# Patient Record
Sex: Male | Born: 1950 | Race: Black or African American | Hispanic: No | Smoking: Current every day smoker
Health system: Southern US, Community
[De-identification: ages and names within clinical notes are randomized; demographics above are authoritative.]

## PROBLEM LIST (undated history)

## (undated) DIAGNOSIS — K859 Acute pancreatitis without necrosis or infection, unspecified: Secondary | ICD-10-CM

## (undated) DIAGNOSIS — D751 Secondary polycythemia: Secondary | ICD-10-CM

## (undated) DIAGNOSIS — D649 Anemia, unspecified: Secondary | ICD-10-CM

## (undated) DIAGNOSIS — I10 Essential (primary) hypertension: Secondary | ICD-10-CM

## (undated) DIAGNOSIS — I639 Cerebral infarction, unspecified: Secondary | ICD-10-CM

## (undated) DIAGNOSIS — T7840XA Allergy, unspecified, initial encounter: Secondary | ICD-10-CM

## (undated) DIAGNOSIS — Z87891 Personal history of nicotine dependence: Secondary | ICD-10-CM

## (undated) HISTORY — DX: Cerebral infarction, unspecified: I63.9

## (undated) HISTORY — DX: Allergy, unspecified, initial encounter: T78.40XA

## (undated) HISTORY — DX: Personal history of nicotine dependence: Z87.891

## (undated) HISTORY — DX: Secondary polycythemia: D75.1

## (undated) HISTORY — DX: Essential (primary) hypertension: I10

---

## 2005-12-04 ENCOUNTER — Emergency Department: Payer: Self-pay | Admitting: Internal Medicine

## 2007-09-02 ENCOUNTER — Inpatient Hospital Stay: Payer: Self-pay | Admitting: Internal Medicine

## 2007-09-02 ENCOUNTER — Other Ambulatory Visit: Payer: Self-pay

## 2007-09-26 ENCOUNTER — Encounter: Payer: Self-pay | Admitting: Internal Medicine

## 2007-10-22 ENCOUNTER — Encounter: Payer: Self-pay | Admitting: Internal Medicine

## 2007-11-21 ENCOUNTER — Encounter: Payer: Self-pay | Admitting: Internal Medicine

## 2007-12-22 ENCOUNTER — Encounter: Payer: Self-pay | Admitting: Internal Medicine

## 2008-01-22 ENCOUNTER — Encounter: Payer: Self-pay | Admitting: Internal Medicine

## 2008-02-19 ENCOUNTER — Encounter: Payer: Self-pay | Admitting: Internal Medicine

## 2015-02-06 DIAGNOSIS — R300 Dysuria: Secondary | ICD-10-CM | POA: Diagnosis not present

## 2015-02-06 DIAGNOSIS — E782 Mixed hyperlipidemia: Secondary | ICD-10-CM | POA: Diagnosis not present

## 2015-02-06 DIAGNOSIS — I10 Essential (primary) hypertension: Secondary | ICD-10-CM | POA: Diagnosis not present

## 2015-02-06 DIAGNOSIS — F17210 Nicotine dependence, cigarettes, uncomplicated: Secondary | ICD-10-CM | POA: Diagnosis not present

## 2015-02-06 DIAGNOSIS — I679 Cerebrovascular disease, unspecified: Secondary | ICD-10-CM | POA: Diagnosis not present

## 2015-02-06 DIAGNOSIS — Z0001 Encounter for general adult medical examination with abnormal findings: Secondary | ICD-10-CM | POA: Diagnosis not present

## 2015-02-28 DIAGNOSIS — E782 Mixed hyperlipidemia: Secondary | ICD-10-CM | POA: Diagnosis not present

## 2015-02-28 DIAGNOSIS — Z0001 Encounter for general adult medical examination with abnormal findings: Secondary | ICD-10-CM | POA: Diagnosis not present

## 2015-02-28 DIAGNOSIS — I10 Essential (primary) hypertension: Secondary | ICD-10-CM | POA: Diagnosis not present

## 2015-02-28 DIAGNOSIS — Z125 Encounter for screening for malignant neoplasm of prostate: Secondary | ICD-10-CM | POA: Diagnosis not present

## 2015-08-05 DIAGNOSIS — I10 Essential (primary) hypertension: Secondary | ICD-10-CM | POA: Diagnosis not present

## 2015-08-05 DIAGNOSIS — E782 Mixed hyperlipidemia: Secondary | ICD-10-CM | POA: Diagnosis not present

## 2015-08-05 DIAGNOSIS — I679 Cerebrovascular disease, unspecified: Secondary | ICD-10-CM | POA: Diagnosis not present

## 2015-08-05 DIAGNOSIS — F17210 Nicotine dependence, cigarettes, uncomplicated: Secondary | ICD-10-CM | POA: Diagnosis not present

## 2016-02-11 DIAGNOSIS — I679 Cerebrovascular disease, unspecified: Secondary | ICD-10-CM | POA: Diagnosis not present

## 2016-02-11 DIAGNOSIS — I10 Essential (primary) hypertension: Secondary | ICD-10-CM | POA: Diagnosis not present

## 2016-02-11 DIAGNOSIS — B359 Dermatophytosis, unspecified: Secondary | ICD-10-CM | POA: Diagnosis not present

## 2016-02-11 DIAGNOSIS — Z0001 Encounter for general adult medical examination with abnormal findings: Secondary | ICD-10-CM | POA: Diagnosis not present

## 2016-02-11 DIAGNOSIS — M25775 Osteophyte, left foot: Secondary | ICD-10-CM | POA: Diagnosis not present

## 2016-03-10 DIAGNOSIS — Z0001 Encounter for general adult medical examination with abnormal findings: Secondary | ICD-10-CM | POA: Diagnosis not present

## 2016-03-10 DIAGNOSIS — E782 Mixed hyperlipidemia: Secondary | ICD-10-CM | POA: Diagnosis not present

## 2016-03-10 DIAGNOSIS — I10 Essential (primary) hypertension: Secondary | ICD-10-CM | POA: Diagnosis not present

## 2016-03-10 DIAGNOSIS — M064 Inflammatory polyarthropathy: Secondary | ICD-10-CM | POA: Diagnosis not present

## 2016-03-10 DIAGNOSIS — Z1211 Encounter for screening for malignant neoplasm of colon: Secondary | ICD-10-CM | POA: Diagnosis not present

## 2016-03-10 DIAGNOSIS — Z125 Encounter for screening for malignant neoplasm of prostate: Secondary | ICD-10-CM | POA: Diagnosis not present

## 2016-03-10 DIAGNOSIS — R748 Abnormal levels of other serum enzymes: Secondary | ICD-10-CM | POA: Insufficient documentation

## 2016-03-17 DIAGNOSIS — E875 Hyperkalemia: Secondary | ICD-10-CM | POA: Diagnosis not present

## 2016-03-17 DIAGNOSIS — F17210 Nicotine dependence, cigarettes, uncomplicated: Secondary | ICD-10-CM | POA: Diagnosis not present

## 2016-03-17 DIAGNOSIS — I69354 Hemiplegia and hemiparesis following cerebral infarction affecting left non-dominant side: Secondary | ICD-10-CM | POA: Diagnosis not present

## 2016-03-17 DIAGNOSIS — I10 Essential (primary) hypertension: Secondary | ICD-10-CM | POA: Diagnosis not present

## 2016-03-17 DIAGNOSIS — R0602 Shortness of breath: Secondary | ICD-10-CM | POA: Diagnosis not present

## 2016-03-17 DIAGNOSIS — E782 Mixed hyperlipidemia: Secondary | ICD-10-CM | POA: Diagnosis not present

## 2016-03-19 ENCOUNTER — Inpatient Hospital Stay: Payer: Medicare Other

## 2016-03-19 ENCOUNTER — Encounter: Payer: Self-pay | Admitting: Hematology and Oncology

## 2016-03-19 ENCOUNTER — Inpatient Hospital Stay: Payer: Medicare Other | Attending: Hematology and Oncology | Admitting: Hematology and Oncology

## 2016-03-19 VITALS — BP 147/73 | HR 81 | Temp 97.6°F | Resp 18 | Ht 70.0 in | Wt 149.7 lb

## 2016-03-19 DIAGNOSIS — Z87891 Personal history of nicotine dependence: Secondary | ICD-10-CM

## 2016-03-19 DIAGNOSIS — I10 Essential (primary) hypertension: Secondary | ICD-10-CM | POA: Diagnosis not present

## 2016-03-19 DIAGNOSIS — Z8673 Personal history of transient ischemic attack (TIA), and cerebral infarction without residual deficits: Secondary | ICD-10-CM

## 2016-03-19 DIAGNOSIS — D751 Secondary polycythemia: Secondary | ICD-10-CM | POA: Diagnosis not present

## 2016-03-19 DIAGNOSIS — F17210 Nicotine dependence, cigarettes, uncomplicated: Secondary | ICD-10-CM | POA: Insufficient documentation

## 2016-03-19 DIAGNOSIS — Z7982 Long term (current) use of aspirin: Secondary | ICD-10-CM | POA: Insufficient documentation

## 2016-03-19 DIAGNOSIS — Z79899 Other long term (current) drug therapy: Secondary | ICD-10-CM | POA: Insufficient documentation

## 2016-03-19 DIAGNOSIS — Z808 Family history of malignant neoplasm of other organs or systems: Secondary | ICD-10-CM | POA: Diagnosis not present

## 2016-03-19 DIAGNOSIS — R748 Abnormal levels of other serum enzymes: Secondary | ICD-10-CM

## 2016-03-19 LAB — CBC WITH DIFFERENTIAL/PLATELET
Basophils Absolute: 0 10*3/uL (ref 0–0.1)
Basophils Relative: 0 %
Eosinophils Absolute: 0 10*3/uL (ref 0–0.7)
Eosinophils Relative: 0 %
HCT: 57.3 % — ABNORMAL HIGH (ref 40.0–52.0)
Hemoglobin: 19.5 g/dL — ABNORMAL HIGH (ref 13.0–18.0)
Lymphocytes Relative: 14 %
Lymphs Abs: 0.7 10*3/uL — ABNORMAL LOW (ref 1.0–3.6)
MCH: 31.2 pg (ref 26.0–34.0)
MCHC: 34.1 g/dL (ref 32.0–36.0)
MCV: 91.5 fL (ref 80.0–100.0)
Monocytes Absolute: 0.5 10*3/uL (ref 0.2–1.0)
Monocytes Relative: 9 %
Neutro Abs: 3.8 10*3/uL (ref 1.4–6.5)
Neutrophils Relative %: 77 %
Platelets: 144 10*3/uL — ABNORMAL LOW (ref 150–440)
RBC: 6.26 MIL/uL — ABNORMAL HIGH (ref 4.40–5.90)
RDW: 13.5 % (ref 11.5–14.5)
WBC: 5 10*3/uL (ref 3.8–10.6)

## 2016-03-19 LAB — IRON AND TIBC
Iron: 136 ug/dL (ref 45–182)
Saturation Ratios: 41 % — ABNORMAL HIGH (ref 17.9–39.5)
TIBC: 333 ug/dL (ref 250–450)
UIBC: 198 ug/dL

## 2016-03-19 LAB — FERRITIN: Ferritin: 181 ng/mL (ref 24–336)

## 2016-03-19 NOTE — Progress Notes (Signed)
Pt reports:  Mild fatigue, had blood in stool beginning of March no colonoscopy.  48oz beer a day

## 2016-03-19 NOTE — Progress Notes (Signed)
[facility] day:  03/19/2016  Chief Complaint: Ian Conley is a 65 y.o. male with an elevated hemoglobin who is referred in consultation by Dr. Clayborn Bigness.  HPI:   The patient denies any prior history of a blood disorder.  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  He was seen on 02/11/2016 for health maintenance exam.  Because of swelling in his joints, his Aggrenox was changed to Plavix.  Labs were drawn.  CBC on 03/10/2016 revealed a hematocrit of 56.1, hemoglobin 19.8, MCV 88, platelets 154,000, and WBC 4200.  Creatinine was 1.02.  Alkaline phosphatase was 139 (39-117).  PSA was 0.4.  Urinalysis on 02/11/2016 revealed no blood.  Symptomatically, he feels pretty good.  He denies any headache or visual changes.  He denies any shortness of breath.  He denies any erythromelalgia.  He notes a 48 pack year smoking history.  He denies sleep apnea.  He does not take testosterone.  He denies any cardiac disease.  Past Medical History  Diagnosis Date  . Hypertension   . Stroke [facility])     2008 9/11  . Allergy   . Personal history of tobacco use, presenting hazards to health 03/25/2016    No past surgical history on file.  Family History  Problem Relation Age of Onset  . Diabetes Mother   . Cancer Father     Throat   . HIV Brother     Social History:  reports that he has been smoking Cigarettes.  He has a 45 pack-year smoking history. He does not have any smokeless tobacco history on file. He reports that he drinks about 12.6 oz of alcohol per week. He reports that he does not use illicit drugs.  He started smoking between the age of 3-18.  He smoked 1 pack a day for 48 years.  He drinks 2- 24 ounce beers/day.  He was in [employer] for 3 years.  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  He lives in [place].  The patient is alone today.  Allergies:  Allergies  Allergen Reactions  . Penicillins Rash    Current  Medications: Current Outpatient Prescriptions  Medication Sig Dispense Refill  . amLODipine (NORVASC) 10 MG tablet take 1 tablet by mouth once daily for blood pressure  0  . aspirin 81 MG tablet Take 81 mg by mouth daily.    . Chlorpheniramine-Phenylephrine 4-10 MG tablet Take 1 tablet by mouth every 4 (four) hours as needed for congestion.    . clopidogrel (PLAVIX) 75 MG tablet Take 75 mg by mouth daily.  0  . metoprolol succinate (TOPROL-XL) 25 MG 24 hr tablet take 1 tablet by mouth once daily for blood pressure  0  . traMADol (ULTRAM) 50 MG tablet Take 50 mg by mouth every 12 (twelve) hours as needed.     No current facility-administered medications for this visit.    Review of Systems:  GENERAL:  Feels pretty good.  Active.  No fevers or sweats.  He has lost 20 pounds in 8 years. PERFORMANCE STATUS (ECOG):  2 HEENT:  Gum disease (losing teeth).  Runny nose.  No visual changes, sore throat, mouth sores or tenderness. Lungs: No shortness of breath.  Am cough.  No hemoptysis.  No sleep apnea. Cardiac:  No chest pain, palpitations, orthopnea, or PND. GI:  Good appetite.  No nausea, vomiting, diarrhea, constipation or melena.  Blood in  stool.  No prior colonoscopy. GU:  No urgency, frequency, dysuria, or hematuria. Musculoskeletal:  No back pain.  Joint pain (knees and fingers).  No muscle tenderness. Extremities:  No pain or swelling. Skin:  No rashes or skin changes. Neuro:  h/o CVA.  No headache, numbness or weakness, balance or coordination issues. Endocrine:  No diabetes, thyroid issues, hot flashes or night sweats. Psych:  No mood changes, depression or anxiety. Pain:  No focal pain. Review of systems:  All other systems reviewed and found to be negative.  Physical Exam: Blood pressure 147/73, pulse 81, temperature 97.6 F (36.4 C), temperature source Tympanic, resp. rate 18, height 5\' 10"  (1.778 m), weight 149 lb 11.1 oz (67.9 kg). GENERAL:  Well developed, well nourished,  sitting comfortably in the exam room in no acute distress. MENTAL STATUS:  Alert and oriented to person, place and time. HEAD:  Wearing a black cap.  Gray hair with beard.  Normocephalic, atraumatic, face symmetric, no Cushingoid features. EYES:  Glasses.  Brown eyes.  Bilateral arcus senilis.  Pupils equal round and reactive to light and accomodation.  No conjunctivitis or scleral icterus. ENT:  Oropharynx clear without lesion.  Poor dentition.  Tongue normal. Mucous membranes moist.  RESPIRATORY:  Clear to auscultation without rales, wheezes or rhonchi. CARDIOVASCULAR:  Regular rate and rhythm without murmur, rub or gallop. ABDOMEN:  Soft, non-tender, with active bowel sounds, and no hepatosplenomegaly.  No masses. SKIN:  No rashes, ulcers or lesions. EXTREMITIES: No edema, no skin discoloration or tenderness.  No palpable cords. LYMPH NODES: No palpable cervical, supraclavicular, axillary or inguinal adenopathy  NEUROLOGICAL: Decreased left nasolabial fold. PSYCH:  Appropriate.  Clinical Support on 03/19/2016  Component Date Value Ref Range Status  . WBC 03/19/2016 5.0  3.8 - 10.6 K/uL Final  . RBC 03/19/2016 6.26* 4.40 - 5.90 MIL/uL Final  . Hemoglobin 03/19/2016 19.5* 13.0 - 18.0 g/dL Final  . HCT 03/19/2016 57.3* 40.0 - 52.0 % Final  . MCV 03/19/2016 91.5  80.0 - 100.0 fL Final  . MCH 03/19/2016 31.2  26.0 - 34.0 pg Final  . MCHC 03/19/2016 34.1  32.0 - 36.0 g/dL Final  . RDW 03/19/2016 13.5  11.5 - 14.5 % Final  . Platelets 03/19/2016 144* 150 - 440 K/uL Final  . Neutrophils Relative % 03/19/2016 77   Final  . Neutro Abs 03/19/2016 3.8  1.4 - 6.5 K/uL Final  . Lymphocytes Relative 03/19/2016 14   Final  . Lymphs Abs 03/19/2016 0.7* 1.0 - 3.6 K/uL Final  . Monocytes Relative 03/19/2016 9   Final  . Monocytes Absolute 03/19/2016 0.5  0.2 - 1.0 K/uL Final  . Eosinophils Relative 03/19/2016 0   Final  . Eosinophils Absolute 03/19/2016 0.0  0 - 0.7 K/uL Final  . Basophils Relative  03/19/2016 0   Final  . Basophils Absolute 03/19/2016 0.0  0 - 0.1 K/uL Final  . Erythropoietin 03/19/2016 3.0  2.6 - 18.5 mIU/mL Final   Comment: (NOTE) Performed At: [facility] [place], [place] [id number] Lindon Romp MD [id number]   . JAK2 GenotypR 03/19/2016 Comment   Final   Comment: (NOTE) Result: NEGATIVE for the JAK2 V617F mutation. Interpretation:  The G to T nucleotide change encoding the V617F mutation was not detected.  This result does not rule out the presence of the JAK2 mutation at a level below the sensitivity of detection of this assay, or the presence of other mutations within JAK2 not detected by  this assay.  This result does not rule out a diagnosis of polycythemia vera, essential thrombocythemia or idiopathic myelofibrosis as the V617F mutation is not detected in all patients with these disorders.   Marland Kitchen BACKGROUND: 03/19/2016 Comment   Final   Comment: (NOTE) JAK2 is a cytoplasmic tyrosine kinase with a key role in signal transduction from multiple hematopoietic growth factor receptors. A point mutation within exon 14 of the JAK2 gene [id number]) encoding a valine to phenylalanine substitution at position 617 of the JAK2 protein (V617F) has been identified in most patients with polycythemia vera, and in about half of those with either essential thrombocythemia or idiopathic myelofibrosis.  The V617F has also been detected, although infrequently, in other myeloid disorders such as chronic myelomonocytic leukemia and chronic neutrophilic leukemia. V617F is an acquired mutation that alters a highly conserved valine present in the negative regulatory JH2 domain of the JAK2 protein and is predicted to dysregulate kinase activity. Methodology: Genomic DNA was purified from the provided specimen.  Allele- specific PCR using fluorescent primers was used to simultaneously amplify both the wild type and mutant alleles. Amplificati                           on products were analyzed by capillary electrophoresis.  This assay has a sensitivity to detect approximately a 5% population of cells containing the V617F mutation in a background of non-mutant cells. Reference: Tefferi A and Gilliland DG.  The JAK2 V617F Tyrosine Kinase Mutation in Myeloproliferative  Disorders:  Status Report and Immediate Implications for Disease Classification and Diagnosis. Mayo Clin Proc 2005;80(7):947-958.   . Director Review, JAK2 03/19/2016 Comment   Final   Comment: (NOTE) Jacklynn Bue MS, PhD, [facility] [place], [place]   . Reflex: 03/19/2016 Comment   Corrected   Comment: (NOTE) Reflex to JAK2 Exon 12 Mutation Analysis is indicated. Performed At: [facility] [address] [place], [place] [id number] Nechama Guard MD [id number] Performed At: [facility] [address] [place], [place] [id number] Nechama Guard MD [id number]   . Leukocyte Alkaline  Phos Stain 03/19/2016 30  25 - 130 Final   Comment: (NOTE) Performed At: [facility] [place], [place] [id number] Lindon Romp MD [id number]   . Ferritin 03/19/2016 181  24 - 336 ng/mL Final  . Iron 03/19/2016 136  45 - 182 ug/dL Final  . TIBC 03/19/2016 333  250 - 450 ug/dL Final  . Saturation Ratios 03/19/2016 41* 17.9 - 39.5 % Final  . UIBC 03/19/2016 198   Final  . JAK2 EXON 12 ANALYSIS RESULT: 03/19/2016 Comment   Final   Comment: (NOTE) NEGATIVE for the JAK2 exon 12 mutations. Interpretation: JAK2 exon 12 mutations were not detected. This result does not rule out the presence of the JAK2 mutation at a level below the sensitivity of detection of this assay, or the presence of other mutations within JAK2 not detected by this assay. This result does not rule out a diagnosis of polycythemia vera, essential thrombocythemia  or idiopathic myelofibrosis as JAK2 exon 12 mutations are not detected in all patients with these disorders.   Marland Kitchen BACKGROUND: 03/19/2016 Comment   Final   Comment: (NOTE) JAK2 is a cytoplasmic tyrosine kinase with a key role in signal transduction in conjunction with multiple hematopoietic growth factor receptors. JAK2 mutation analysis can be used in conjunction with bone  marrow histology and cytogenetic analysis to assist in the diagnosis of myeloproliferative disorders (MPDs) such as polycythemia vera, essential thrombocythemia, and idiopathic myelofibrosis. The JAK2 V617F (exon 14) mutation analysis can provide information helpful in distinguishing MPDs from from reactive conditions, particularly secondary thrombocytosis and erythrocytosis. Because JAK2 exon 12 mutation status is associated with erythrocytosis and atypical bone marrow morphology, mutational analysis may be used to differentiate the reactive conditions from the malignant erythrocytosis. Peripheral blood mutation screening cannot substitute for bone marrow histology as JAK2 mutations are not always detected in patients with polycythemia vera and is absent in a                          lmost half of patients with essential thrombocythemia and idiopathic myelofibrosis. JAK2 Exon 12 mutations detected by this assay: Brule, [id number], [id number], [id number]. Methodology: Genomic DNA was purified from blood or bone marrow specimen. Allele-specific PCR using fluorescent primers was used to amplify both the wild type and mutant alleles. Amplification products were analyzed by capillary electrophoresis. Reference: 1. Tefferi A. JAK2 Mutations in Polycythemia Vera-Molecular   Mechanisms and Clinical Applications. N Engl J Med. 2007;   356(5):443-444. 2. Scott LM. et al. JAK2 Exon 12 Mutations in Polycythemia   Vera and Idiopathic Erythrocytosis. N Engl J Med. 2007;   356(5):459-468.   Marland Kitchen DIRECTOR REVIEW:  03/19/2016 Comment   Final   Comment: (NOTE) Jacklynn Bue MS, PhD, [facility] [place], [place] [phone] Performed At: [facility] [address] [place], [place] [id number] Nechama Guard MD [id number] Performed At: [facility] [address] [place], [place] [id number] Nechama Guard MD [id number]     Assessment:  ANTHONY WAXLER is a 65 y.o. 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  He has a 48 pack year smoking history.  He denies any cardiac disease, sleep apnea, or testosterone use.  He had a CVA in 09/01/2007.  He has an elevated alkaline phosphatase (139).  PSA was 0.4.  He has never had a colonoscopy.  Symptomatically, he feels good.  He notes blood in his stool since 02/2016.  Exam is unremarkable.  Plan: 1.  Discuss diagnosis of polycythemia.  Discuss differential diagnosis including primary polycythemia (PV) and secondary polycythemia.  Discuss likely etiology is smoking.  Discuss smoking cessation.  Discuss baseline labs.  Discuss possible phlebotomy treatment.  Discuss low dose spiral chest CT as screening for lung cancer given 48 pack year smoking history. 2.  Discuss elevated alkaline phosphatase and fractionation to determine source (bone versus liver). 3:  Labs today:  CBC with diff, erythropoietin level, JAK2 with reflex, ferritin, iron studies, fractionated alkaline phosphatase. 4.  Low dose spiral chest CT:  screening lung cancer. 5.  Encourage smoking cessation. 6.  Encourage colonoscopy re: health maintenance and blood in stool. 7.  RTC in 2 weeks for MD assessment and review of work-up.   Lequita Asal, MD  03/19/2016, 8:34 AM

## 2016-03-20 LAB — LEUKOCYTE ALKALINE PHOSPHATASE: Leukocyte Alkaline  Phos Stain: 30 (ref 25–130)

## 2016-03-24 ENCOUNTER — Ambulatory Visit: Admission: RE | Admit: 2016-03-24 | Payer: Medicare Other | Source: Ambulatory Visit

## 2016-03-25 ENCOUNTER — Encounter: Payer: Self-pay | Admitting: Family Medicine

## 2016-03-25 ENCOUNTER — Ambulatory Visit
Admission: RE | Admit: 2016-03-25 | Discharge: 2016-03-25 | Disposition: A | Discharge status: Home / Self Care | Payer: Medicare Other | Source: Ambulatory Visit | Attending: Family Medicine | Admitting: Family Medicine

## 2016-03-25 ENCOUNTER — Inpatient Hospital Stay: Payer: Medicare Other | Attending: Family Medicine | Admitting: Family Medicine

## 2016-03-25 ENCOUNTER — Other Ambulatory Visit: Payer: Self-pay | Admitting: Family Medicine

## 2016-03-25 DIAGNOSIS — Z122 Encounter for screening for malignant neoplasm of respiratory organs: Secondary | ICD-10-CM

## 2016-03-25 DIAGNOSIS — Z7982 Long term (current) use of aspirin: Secondary | ICD-10-CM | POA: Diagnosis not present

## 2016-03-25 DIAGNOSIS — Z8673 Personal history of transient ischemic attack (TIA), and cerebral infarction without residual deficits: Secondary | ICD-10-CM | POA: Diagnosis not present

## 2016-03-25 DIAGNOSIS — F17210 Nicotine dependence, cigarettes, uncomplicated: Secondary | ICD-10-CM | POA: Insufficient documentation

## 2016-03-25 DIAGNOSIS — D751 Secondary polycythemia: Secondary | ICD-10-CM | POA: Insufficient documentation

## 2016-03-25 DIAGNOSIS — R748 Abnormal levels of other serum enzymes: Secondary | ICD-10-CM | POA: Insufficient documentation

## 2016-03-25 DIAGNOSIS — K921 Melena: Secondary | ICD-10-CM | POA: Insufficient documentation

## 2016-03-25 DIAGNOSIS — Z79899 Other long term (current) drug therapy: Secondary | ICD-10-CM | POA: Diagnosis not present

## 2016-03-25 DIAGNOSIS — K3189 Other diseases of stomach and duodenum: Secondary | ICD-10-CM | POA: Insufficient documentation

## 2016-03-25 DIAGNOSIS — Z87891 Personal history of nicotine dependence: Secondary | ICD-10-CM | POA: Diagnosis not present

## 2016-03-25 DIAGNOSIS — I10 Essential (primary) hypertension: Secondary | ICD-10-CM | POA: Insufficient documentation

## 2016-03-25 HISTORY — DX: Personal history of nicotine dependence: Z87.891

## 2016-03-25 NOTE — Progress Notes (Signed)
In accordance with CMS guidelines, patient has meet eligibility criteria including age, absence of signs or symptoms of lung cancer, the specific calculation of cigarette smoking pack-years was 45 years and is a current smoker.   A shared decision-making session was conducted prior to the performance of CT scan. This includes one or more decision aids, includes benefits and harms of screening, follow-up diagnostic testing, over-diagnosis, false positive rate, and total radiation exposure.  Counseling on the importance of adherence to annual lung cancer LDCT screening, impact of co-morbidities, and ability or willingness to undergo diagnosis and treatment is imperative for compliance of the program.  Counseling on the importance of continued smoking cessation for former smokers; the importance of smoking cessation for current smokers and information about tobacco cessation interventions have been given to patient including the [facility] at [facility], 1800 quit [place], as well as Cancer Center specific smoking cessation programs.  Written order for lung cancer screening with LDCT has been given to the patient and any and all questions have been answered to the best of my abilities.   Yearly follow up will be scheduled by Shawn Perkins, Thoracic Navigator.   

## 2016-03-27 ENCOUNTER — Telehealth: Payer: Self-pay | Admitting: *Deleted

## 2016-03-27 NOTE — Telephone Encounter (Signed)
Notified patient of LDCT lung cancer screening results of Lung Rads 1 finding with recommendation for 12 month follow up imaging. Also notified of incidental finding noted below. Patient verbalizes understanding.   IMPRESSION: 1. Lung-RADS Category 1, negative. Continue annual screening with low-dose chest CT without contrast in 12 months. 2. Thickened area in the distal stomach or pyloric region, incompletely imaged. Please correlate clinically.

## 2016-04-01 LAB — JAK2 EXON 12 MUTATION ANALYSIS

## 2016-04-01 LAB — ERYTHROPOIETIN: Erythropoietin: 3 m[IU]/mL (ref 2.6–18.5)

## 2016-04-01 LAB — JAK2  V617F QUAL. WITH REFLEX TO EXON 12

## 2016-04-02 ENCOUNTER — Inpatient Hospital Stay: Payer: Medicare Other | Admitting: Internal Medicine

## 2016-04-03 ENCOUNTER — Inpatient Hospital Stay (HOSPITAL_BASED_OUTPATIENT_CLINIC_OR_DEPARTMENT_OTHER): Payer: Medicare Other | Admitting: Hematology and Oncology

## 2016-04-03 ENCOUNTER — Encounter: Payer: Self-pay | Admitting: Hematology and Oncology

## 2016-04-03 VITALS — BP 151/77 | HR 76 | Temp 98.2°F | Resp 18 | Wt 149.9 lb

## 2016-04-03 DIAGNOSIS — R748 Abnormal levels of other serum enzymes: Secondary | ICD-10-CM

## 2016-04-03 DIAGNOSIS — Z8673 Personal history of transient ischemic attack (TIA), and cerebral infarction without residual deficits: Secondary | ICD-10-CM

## 2016-04-03 DIAGNOSIS — Z79899 Other long term (current) drug therapy: Secondary | ICD-10-CM

## 2016-04-03 DIAGNOSIS — F17210 Nicotine dependence, cigarettes, uncomplicated: Secondary | ICD-10-CM

## 2016-04-03 DIAGNOSIS — K921 Melena: Secondary | ICD-10-CM | POA: Diagnosis not present

## 2016-04-03 DIAGNOSIS — D751 Secondary polycythemia: Secondary | ICD-10-CM | POA: Diagnosis not present

## 2016-04-03 DIAGNOSIS — I10 Essential (primary) hypertension: Secondary | ICD-10-CM | POA: Diagnosis not present

## 2016-04-03 DIAGNOSIS — K3189 Other diseases of stomach and duodenum: Secondary | ICD-10-CM

## 2016-04-03 DIAGNOSIS — Z87891 Personal history of nicotine dependence: Secondary | ICD-10-CM

## 2016-04-03 DIAGNOSIS — Z7982 Long term (current) use of aspirin: Secondary | ICD-10-CM | POA: Diagnosis not present

## 2016-04-03 NOTE — Progress Notes (Signed)
Here to discuss lab results from last appointment.

## 2016-04-03 NOTE — Progress Notes (Signed)
[place] [facility] day:  04/03/2016  Chief Complaint: Ian Conley is a 65 y.o. male with an elevated hemoglobin who is seen for review of work-up and discussion regarding direction of therapy.  HPI:   The patient was last seen in the medical oncology clinic on 03/19/2016.  At that time, he was seen for initial consultation for erythrocytosis. He had a 48 pack year smoking history.  He denied any cardiac disease, sleep apnea, or testosterone use.  He had a CVA in 09/01/2007.  He was also noted to have an elevated alkaline phosphatase (139).  We discussed a work-up including labs and imaging studies.  CBC on 03/19/2016 revealed a hematocrit of 57.3, hemoglobin 19.5, MCV 91.5, platelets 144,000, and WBC 5000.  Erythropoietin level was 3.0 (2.6-18.5).  JAK2 was negative for V617F and exon 12.  Ferritin was 181.  Iron studies revealed a saturation of 41% and a TIBC of 333.  Low dose spiral chest CT scan on 03/25/2016 was negative.  There was notation of a thickened area in the distal stomach or pyloric region (clinical correlation recommended).  Symptomatically, he feels pretty good.  He denies any headache or visual changes.  He denies any shortness of breath.  He denies any erythromelalgia.  He notes a change in his blood pressure medications.   Past Medical History  Diagnosis Date  . Hypertension   . Stroke [facility])     2008 9/11  . Allergy   . Personal history of tobacco use, presenting hazards to health 03/25/2016    No past surgical history on file.  Family History  Problem Relation Age of Onset  . Diabetes Mother   . Cancer Father     Throat   . HIV Brother     Social History:  reports that he has been smoking Cigarettes.  He has a 45 pack-year smoking history. He does not have any smokeless tobacco history on file. He reports that he drinks about 12.6 oz of alcohol per week. He reports that he does not use illicit drugs.  He started smoking  between the age of [age].  He smoked 1 pack a day for 48 years.  He drinks 2- 24 ounce beers/day.  He was in [employer] for 3 years.  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  He lives in [place].  The patient is alone today.  Allergies:  Allergies  Allergen Reactions  . Penicillins Rash    Current Medications: Current Outpatient Prescriptions  Medication Sig Dispense Refill  . amLODipine (NORVASC) 10 MG tablet take 1 tablet by mouth once daily for blood pressure  0  . aspirin 81 MG tablet Take 81 mg by mouth daily.    . benazepril (LOTENSIN) 40 MG tablet   0  . Chlorpheniramine-Phenylephrine 4-10 MG tablet Take 1 tablet by mouth every 4 (four) hours as needed for congestion.    . clopidogrel (PLAVIX) 75 MG tablet Take 75 mg by mouth daily.  0  . metoprolol succinate (TOPROL-XL) 25 MG 24 hr tablet take 1 tablet by mouth once daily for blood pressure  0  . traMADol (ULTRAM) 50 MG tablet Take 50 mg by mouth every 12 (twelve) hours as needed.     No current facility-administered medications for this visit.    Review of Systems:  GENERAL:  Feels good.  Active.  No fevers or sweats.  He has lost 20 pounds in 8  years. PERFORMANCE STATUS (ECOG):  2 HEENT:  Gum disease (losing teeth).  Runny nose.  No visual changes, sore throat, mouth sores or tenderness. Lungs: No shortness of breath.  Am cough.  No hemoptysis.  No sleep apnea. Cardiac:  No chest pain, palpitations, orthopnea, or PND. GI:  Good appetite.  No nausea, vomiting, diarrhea, constipation or melena.  Blood in stool.  No prior colonoscopy. GU:  No urgency, frequency, dysuria, or hematuria. Musculoskeletal:  No back pain.  Joint pain (knees and fingers).  No muscle tenderness. Extremities:  No pain or swelling. Skin:  No rashes or skin changes. Neuro:  h/o CVA.  No headache, numbness or weakness, balance or coordination issues. Endocrine:  No diabetes, thyroid issues, hot flashes or night sweats. Psych:  No  mood changes, depression or anxiety. Pain:  No focal pain. Review of systems:  All other systems reviewed and found to be negative.  Physical Exam: Blood pressure 151/77, pulse 76, temperature 98.2 F (36.8 C), temperature source Oral, resp. rate 18, weight 149 lb 14.6 oz (68 kg). GENERAL:  Well developed, well nourished, sitting comfortably in the exam room in no acute distress. MENTAL STATUS:  Alert and oriented to person, place and time. HEAD:  Wearing a black cap.  Gray hair with beard.  Normocephalic, atraumatic, face symmetric, no Cushingoid features. EYES:  Glasses.  Brown eyes.  Bilateral arcus senilis.  No conjunctivitis or scleral icterus. NEUROLOGICAL: Decreased left nasolabial fold. PSYCH:  Appropriate.  No visits with results within 3 Day(s) from this visit. Latest known visit with results is:  Clinical Support on 03/19/2016  Component Date Value Ref Range Status  . WBC 03/19/2016 5.0  3.8 - 10.6 K/uL Final  . RBC 03/19/2016 6.26* 4.40 - 5.90 MIL/uL Final  . Hemoglobin 03/19/2016 19.5* 13.0 - 18.0 g/dL Final  . HCT 03/19/2016 57.3* 40.0 - 52.0 % Final  . MCV 03/19/2016 91.5  80.0 - 100.0 fL Final  . MCH 03/19/2016 31.2  26.0 - 34.0 pg Final  . MCHC 03/19/2016 34.1  32.0 - 36.0 g/dL Final  . RDW 03/19/2016 13.5  11.5 - 14.5 % Final  . Platelets 03/19/2016 144* 150 - 440 K/uL Final  . Neutrophils Relative % 03/19/2016 77   Final  . Neutro Abs 03/19/2016 3.8  1.4 - 6.5 K/uL Final  . Lymphocytes Relative 03/19/2016 14   Final  . Lymphs Abs 03/19/2016 0.7* 1.0 - 3.6 K/uL Final  . Monocytes Relative 03/19/2016 9   Final  . Monocytes Absolute 03/19/2016 0.5  0.2 - 1.0 K/uL Final  . Eosinophils Relative 03/19/2016 0   Final  . Eosinophils Absolute 03/19/2016 0.0  0 - 0.7 K/uL Final  . Basophils Relative 03/19/2016 0   Final  . Basophils Absolute 03/19/2016 0.0  0 - 0.1 K/uL Final  . Erythropoietin 03/19/2016 3.0  2.6 - 18.5 mIU/mL Final   Comment: (NOTE) Performed At: [facility] [place], [place] [id number] Lindon Romp MD [id number]   . JAK2 GenotypR 03/19/2016 Comment   Final   Comment: (NOTE) Result: NEGATIVE for the JAK2 V617F mutation. Interpretation:  The G to T nucleotide change encoding the V617F mutation was not detected.  This result does not rule out the presence of the JAK2 mutation at a level below the sensitivity of detection of this assay, or the presence of other mutations within JAK2 not detected by this assay.  This result does not rule out a diagnosis of polycythemia vera, essential  thrombocythemia or idiopathic myelofibrosis as the V617F mutation is not detected in all patients with these disorders.   Marland Kitchen BACKGROUND: 03/19/2016 Comment   Final   Comment: (NOTE) JAK2 is a cytoplasmic tyrosine kinase with a key role in signal transduction from multiple hematopoietic growth factor receptors. A point mutation within exon 14 of the JAK2 gene [id number]) encoding a valine to phenylalanine substitution at position 617 of the JAK2 protein (V617F) has been identified in most patients with polycythemia vera, and in about half of those with either essential thrombocythemia or idiopathic myelofibrosis.  The V617F has also been detected, although infrequently, in other myeloid disorders such as chronic myelomonocytic leukemia and chronic neutrophilic leukemia. V617F is an acquired mutation that alters a highly conserved valine present in the negative regulatory JH2 domain of the JAK2 protein and is predicted to dysregulate kinase activity. Methodology: Genomic DNA was purified from the provided specimen.  Allele- specific PCR using fluorescent primers was used to simultaneously amplify both the wild type and mutant alleles. Amplificati                          on products were analyzed by capillary electrophoresis.  This assay has a sensitivity to detect approximately a 5% population of cells containing the V617F  mutation in a background of non-mutant cells. Reference: Tefferi A and Gilliland DG.  The JAK2 V617F Tyrosine Kinase Mutation in Myeloproliferative  Disorders:  Status Report and Immediate Implications for Disease Classification and Diagnosis. Mayo Clin Proc 2005;80(7):947-958.   . Director Review, JAK2 03/19/2016 Comment   Final   Comment: (NOTE) Jacklynn Bue MS, PhD, [place] [facility] [place], [place]   . Reflex: 03/19/2016 Comment   Corrected   Comment: (NOTE) Reflex to JAK2 Exon 12 Mutation Analysis is indicated. Performed At: [facility] [address] [place], [place] [id number] Nechama Guard MD [id number] Performed At: [facility] [address] [place], [place] [id number] Nechama Guard MD [id number]   . Leukocyte Alkaline  Phos Stain 03/19/2016 30  25 - 130 Final   Comment: (NOTE) Performed At: [facility] [place], [place] [id number] Lindon Romp MD [id number]   . Ferritin 03/19/2016 181  24 - 336 ng/mL Final  . Iron 03/19/2016 136  45 - 182 ug/dL Final  . TIBC 03/19/2016 333  250 - 450 ug/dL Final  . Saturation Ratios 03/19/2016 41* 17.9 - 39.5 % Final  . UIBC 03/19/2016 198   Final  . JAK2 EXON 12 ANALYSIS RESULT: 03/19/2016 Comment   Final   Comment: (NOTE) NEGATIVE for the JAK2 exon 12 mutations. Interpretation: JAK2 exon 12 mutations were not detected. This result does not rule out the presence of the JAK2 mutation at a level below the sensitivity of detection of this assay, or the presence of other mutations within JAK2 not detected by this assay. This result does not rule out a diagnosis of polycythemia vera, essential thrombocythemia or idiopathic myelofibrosis as JAK2 exon 12 mutations are not detected in all patients with these disorders.   Marland Kitchen BACKGROUND: 03/19/2016 Comment   Final   Comment: (NOTE) JAK2  is a cytoplasmic tyrosine kinase with a key role in signal transduction in conjunction with multiple hematopoietic growth factor receptors. JAK2 mutation analysis can be used in conjunction with bone marrow histology and cytogenetic analysis to assist in the diagnosis of myeloproliferative disorders (MPDs) such  as polycythemia vera, essential thrombocythemia, and idiopathic myelofibrosis. The JAK2 V617F (exon 14) mutation analysis can provide information helpful in distinguishing MPDs from from reactive conditions, particularly secondary thrombocytosis and erythrocytosis. Because JAK2 exon 12 mutation status is associated with erythrocytosis and atypical bone marrow morphology, mutational analysis may be used to differentiate the reactive conditions from the malignant erythrocytosis. Peripheral blood mutation screening cannot substitute for bone marrow histology as JAK2 mutations are not always detected in patients with polycythemia vera and is absent in a                          lmost half of patients with essential thrombocythemia and idiopathic myelofibrosis. JAK2 Exon 12 mutations detected by this assay: [place], [id number], [id number], [id number]. Methodology: Genomic DNA was purified from blood or bone marrow specimen. Allele-specific PCR using fluorescent primers was used to amplify both the wild type and mutant alleles. Amplification products were analyzed by capillary electrophoresis. Reference: 1. Tefferi A. JAK2 Mutations in Polycythemia Vera-Molecular   Mechanisms and Clinical Applications. N Engl J Med. 2007;   356(5):443-444. 2. Scott LM. et al. JAK2 Exon 12 Mutations in Polycythemia   Vera and Idiopathic Erythrocytosis. N Engl J Med. 2007;   356(5):459-468.   Marland Kitchen DIRECTOR REVIEW: 03/19/2016 Comment   Final   Comment: (NOTE) Jacklynn Bue MS, PhD, [place] [facility] [place],  [place] [phone] Performed At: [facility] [address] [place], [place] [id number] Nechama Guard MD [id number] Performed At: [facility] [address] [place], [place] [id number] Nechama Guard MD [id number]     Assessment:  Ian Conley is a 64 y.o. male with polycythemia first noted on 03/10/2016.  He has a 48 pack year smoking history.  He denies any cardiac disease, sleep apnea, or testosterone use.  He had a CVA in 09/01/2007.  Work-up on 03/19/2016 revealed a hematocrit of 57.3, hemoglobin 19.5, MCV 91.5, platelets 144,000, and WBC 5000.  Erythropoietin level was 3.0 (2.6-18.5).  JAK2 was negative for V617F and exon 12.  Ferritin was 181.  Iron studies revealed a saturation of 41% and a TIBC of 333.  Low dose spiral chest CT scan on 03/25/2016 was negative.  There was notation of a thickened area in the distal stomach or pyloric region.  He has an elevated alkaline phosphatase (139).  PSA was 0.4.  He has never had a colonoscopy.  Symptomatically, he feels good.  He notes blood in his stool since 02/2016.  Exam is unremarkable.  Plan: 1.  Discuss work-up.  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  Discuss repeat epo level in future.  Discuss plan for phlebotomy given history of CVA.  Goal hematocrit is 45 for PV and normal range for secondary erythrocytosis.  Patient unable to undergo phlebotomy today (wishes to postpone until next week). 2.  Discuss spiral CT scan.  Discuss thickened distal stomach and pyloric region.  Discuss need for upper endoscopy (EGD).  Patient has plans for GI evaluation and colonoscopy.   3.  Encourage smoking cessation. 4.  Check fractionated alkaline phosphatase at next blood draw.  Inadvertantly not performed. 5:  GI evaluation for EGD and colonoscopy. 6.  RTC on 04/07/2016 for labs (CBC, fractionated alkaline phosphatase) and phlebotomy. 7.  RTC on 04/25, 05/02, and  05/09 for Hgb +/- phlebotomy. 8.  RTC on 05/05/2016 for MD assessment, labs (  CBC with diff) +/- phlebotomy.   Lequita Asal, MD  04/03/2016

## 2016-04-05 ENCOUNTER — Encounter: Payer: Self-pay | Admitting: Hematology and Oncology

## 2016-04-07 ENCOUNTER — Inpatient Hospital Stay: Payer: Medicare Other

## 2016-04-07 DIAGNOSIS — R748 Abnormal levels of other serum enzymes: Secondary | ICD-10-CM

## 2016-04-07 DIAGNOSIS — Z79899 Other long term (current) drug therapy: Secondary | ICD-10-CM | POA: Diagnosis not present

## 2016-04-07 DIAGNOSIS — D751 Secondary polycythemia: Secondary | ICD-10-CM | POA: Diagnosis not present

## 2016-04-07 DIAGNOSIS — Z8673 Personal history of transient ischemic attack (TIA), and cerebral infarction without residual deficits: Secondary | ICD-10-CM | POA: Diagnosis not present

## 2016-04-07 DIAGNOSIS — K921 Melena: Secondary | ICD-10-CM | POA: Diagnosis not present

## 2016-04-07 DIAGNOSIS — I10 Essential (primary) hypertension: Secondary | ICD-10-CM | POA: Diagnosis not present

## 2016-04-07 DIAGNOSIS — Z7982 Long term (current) use of aspirin: Secondary | ICD-10-CM | POA: Diagnosis not present

## 2016-04-07 LAB — CBC WITH DIFFERENTIAL/PLATELET
Basophils Absolute: 0 10*3/uL (ref 0–0.1)
Basophils Relative: 1 %
Eosinophils Absolute: 0 10*3/uL (ref 0–0.7)
Eosinophils Relative: 1 %
HCT: 54.7 % — ABNORMAL HIGH (ref 40.0–52.0)
Hemoglobin: 18.6 g/dL — ABNORMAL HIGH (ref 13.0–18.0)
Lymphocytes Relative: 33 %
Lymphs Abs: 1.3 10*3/uL (ref 1.0–3.6)
MCH: 30.9 pg (ref 26.0–34.0)
MCHC: 34 g/dL (ref 32.0–36.0)
MCV: 90.9 fL (ref 80.0–100.0)
Monocytes Absolute: 0.5 10*3/uL (ref 0.2–1.0)
Monocytes Relative: 12 %
Neutro Abs: 2.2 10*3/uL (ref 1.4–6.5)
Neutrophils Relative %: 53 %
Platelets: 141 10*3/uL — ABNORMAL LOW (ref 150–440)
RBC: 6.02 MIL/uL — ABNORMAL HIGH (ref 4.40–5.90)
RDW: 13.5 % (ref 11.5–14.5)
WBC: 4.1 10*3/uL (ref 3.8–10.6)

## 2016-04-08 LAB — ALKALINE PHOSPHATASE, ISOENZYMES
Alk Phos Bone Fract: 45 % (ref 12–68)
Alk Phos Liver Fract: 55 % (ref 13–88)
Alk Phos: 132 IU/L — ABNORMAL HIGH (ref 39–117)
Intestinal %: 0 % (ref 0–18)

## 2016-04-14 ENCOUNTER — Inpatient Hospital Stay: Payer: Medicare Other

## 2016-04-14 DIAGNOSIS — K3189 Other diseases of stomach and duodenum: Secondary | ICD-10-CM | POA: Diagnosis not present

## 2016-04-14 DIAGNOSIS — D45 Polycythemia vera: Secondary | ICD-10-CM | POA: Diagnosis not present

## 2016-04-14 DIAGNOSIS — I69354 Hemiplegia and hemiparesis following cerebral infarction affecting left non-dominant side: Secondary | ICD-10-CM | POA: Diagnosis not present

## 2016-04-14 DIAGNOSIS — L723 Sebaceous cyst: Secondary | ICD-10-CM | POA: Diagnosis not present

## 2016-04-21 ENCOUNTER — Inpatient Hospital Stay: Payer: Medicare Other

## 2016-04-21 ENCOUNTER — Inpatient Hospital Stay: Payer: Medicare Other | Attending: Hematology and Oncology

## 2016-04-21 ENCOUNTER — Other Ambulatory Visit: Payer: Self-pay | Admitting: Hematology and Oncology

## 2016-04-21 DIAGNOSIS — Z7982 Long term (current) use of aspirin: Secondary | ICD-10-CM | POA: Diagnosis not present

## 2016-04-21 DIAGNOSIS — D751 Secondary polycythemia: Secondary | ICD-10-CM | POA: Diagnosis not present

## 2016-04-21 DIAGNOSIS — Z8673 Personal history of transient ischemic attack (TIA), and cerebral infarction without residual deficits: Secondary | ICD-10-CM | POA: Insufficient documentation

## 2016-04-21 DIAGNOSIS — F17210 Nicotine dependence, cigarettes, uncomplicated: Secondary | ICD-10-CM | POA: Diagnosis not present

## 2016-04-21 DIAGNOSIS — Z79899 Other long term (current) drug therapy: Secondary | ICD-10-CM | POA: Diagnosis not present

## 2016-04-21 DIAGNOSIS — I10 Essential (primary) hypertension: Secondary | ICD-10-CM | POA: Diagnosis not present

## 2016-04-21 LAB — HEMOGLOBIN: Hemoglobin: 17.6 g/dL (ref 13.0–18.0)

## 2016-04-28 ENCOUNTER — Other Ambulatory Visit: Payer: Self-pay | Admitting: *Deleted

## 2016-04-28 ENCOUNTER — Inpatient Hospital Stay: Payer: Medicare Other

## 2016-04-28 ENCOUNTER — Telehealth: Payer: Self-pay | Admitting: *Deleted

## 2016-04-28 VITALS — BP 129/75 | HR 65 | Temp 96.2°F

## 2016-04-28 DIAGNOSIS — Z79899 Other long term (current) drug therapy: Secondary | ICD-10-CM | POA: Diagnosis not present

## 2016-04-28 DIAGNOSIS — D751 Secondary polycythemia: Secondary | ICD-10-CM | POA: Diagnosis not present

## 2016-04-28 DIAGNOSIS — Z7982 Long term (current) use of aspirin: Secondary | ICD-10-CM | POA: Diagnosis not present

## 2016-04-28 DIAGNOSIS — Z8673 Personal history of transient ischemic attack (TIA), and cerebral infarction without residual deficits: Secondary | ICD-10-CM | POA: Diagnosis not present

## 2016-04-28 DIAGNOSIS — I10 Essential (primary) hypertension: Secondary | ICD-10-CM | POA: Diagnosis not present

## 2016-04-28 LAB — HEMOGLOBIN: Hemoglobin: 17.2 g/dL (ref 13.0–18.0)

## 2016-04-28 NOTE — Telephone Encounter (Signed)
She does not have orders for phlebotomy today.  Needs orders and parameters.  Spoke to [place] and she states that pt hgb did not come down very much. She would like to increase the amount of phebotomy if he is not having any problems with the last few he had.  I spoke to [place] and she went out and got him and he says he tolerates the phlebotomies just fine.  Dr. Mike Gip wants to inc. Today to 400 ml.  Next time to 450 ml and next time after that 500 if hgb is over 15.5.  When I check the sch. Pt due to see [place] next week. Will edit notes on sch. To indicate.

## 2016-05-04 ENCOUNTER — Other Ambulatory Visit: Payer: Self-pay

## 2016-05-04 DIAGNOSIS — R748 Abnormal levels of other serum enzymes: Secondary | ICD-10-CM

## 2016-05-05 ENCOUNTER — Inpatient Hospital Stay: Payer: Medicare Other

## 2016-05-05 ENCOUNTER — Other Ambulatory Visit: Payer: Self-pay | Admitting: Hematology and Oncology

## 2016-05-05 ENCOUNTER — Inpatient Hospital Stay (HOSPITAL_BASED_OUTPATIENT_CLINIC_OR_DEPARTMENT_OTHER): Payer: Medicare Other | Admitting: Hematology and Oncology

## 2016-05-05 VITALS — BP 139/80 | HR 74 | Temp 96.5°F | Resp 17 | Ht 70.0 in | Wt 155.0 lb

## 2016-05-05 DIAGNOSIS — D45 Polycythemia vera: Secondary | ICD-10-CM

## 2016-05-05 DIAGNOSIS — R748 Abnormal levels of other serum enzymes: Secondary | ICD-10-CM

## 2016-05-05 DIAGNOSIS — D751 Secondary polycythemia: Secondary | ICD-10-CM | POA: Diagnosis not present

## 2016-05-05 DIAGNOSIS — I10 Essential (primary) hypertension: Secondary | ICD-10-CM | POA: Diagnosis not present

## 2016-05-05 DIAGNOSIS — Z79899 Other long term (current) drug therapy: Secondary | ICD-10-CM | POA: Diagnosis not present

## 2016-05-05 DIAGNOSIS — F17210 Nicotine dependence, cigarettes, uncomplicated: Secondary | ICD-10-CM

## 2016-05-05 DIAGNOSIS — Z7982 Long term (current) use of aspirin: Secondary | ICD-10-CM | POA: Diagnosis not present

## 2016-05-05 DIAGNOSIS — Z8673 Personal history of transient ischemic attack (TIA), and cerebral infarction without residual deficits: Secondary | ICD-10-CM | POA: Diagnosis not present

## 2016-05-05 LAB — CBC WITH DIFFERENTIAL/PLATELET
Basophils Absolute: 0 10*3/uL (ref 0–0.1)
Basophils Relative: 1 %
Eosinophils Absolute: 0.1 10*3/uL (ref 0–0.7)
Eosinophils Relative: 1 %
HCT: 47.9 % (ref 40.0–52.0)
Hemoglobin: 16.2 g/dL (ref 13.0–18.0)
Lymphocytes Relative: 38 %
Lymphs Abs: 1.6 10*3/uL (ref 1.0–3.6)
MCH: 31.1 pg (ref 26.0–34.0)
MCHC: 33.9 g/dL (ref 32.0–36.0)
MCV: 91.8 fL (ref 80.0–100.0)
Monocytes Absolute: 0.6 10*3/uL (ref 0.2–1.0)
Monocytes Relative: 13 %
Neutro Abs: 2 10*3/uL (ref 1.4–6.5)
Neutrophils Relative %: 47 %
Platelets: 146 10*3/uL — ABNORMAL LOW (ref 150–440)
RBC: 5.21 MIL/uL (ref 4.40–5.90)
RDW: 14.1 % (ref 11.5–14.5)
WBC: 4.2 10*3/uL (ref 3.8–10.6)

## 2016-05-05 NOTE — Progress Notes (Signed)
Pt reports he seen Dr.Khan and received antibiotic for hair bump on face.  Pt concerned about not knowing why he has to come so often and the reason he needs to keep coming.

## 2016-05-05 NOTE — Progress Notes (Signed)
[place] [facility] day:  05/05/2016   Chief Complaint: Ian Conley is a 65 y.o. male with an elevated hemoglobin who is seen for review of work-up and discussion regarding direction of therapy.  HPI:   The patient was last seen in the medical oncology clinic on 04/03/2016.  At that time, work-up was reviewed.  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  Erythropoietin level was low end of normal.  We discussed phlebotomy secondary to his history of CVA.  I encouraged smoking cessation.  We discussed GI evaluation for endoscopy given his CT findings.  He has undergone small volume phlebotomies (350 cc) on 04/18, 05/02, and 04/28/2016.    He states that overall, he feels good.  He feels "about the same".  He is going to go to the [place] for GI evaluation.  He is considering not smoking.  He states it cost $52 for "the patch".  He has not made up his mind yet.  He is worried about the [place], coming to this clinic.   Past Medical History  Diagnosis Date  . Hypertension   . Stroke [facility])     2008 9/11  . Allergy   . Personal history of tobacco use, presenting hazards to health 03/25/2016    No past surgical history on file.  Family History  Problem Relation Age of Onset  . Diabetes Mother   . Cancer Father     Throat   . HIV Brother     Social History:  reports that he has been smoking Cigarettes.  He has a 45 pack-year smoking history. He does not have any smokeless tobacco history on file. He reports that he drinks about 12.6 oz of alcohol per week. He reports that he does not use illicit drugs.  He started smoking between the age of 4-18.  He smoked 1 pack a day for 48 years.  He drinks 2- 24 ounce beers/day.  He was in [employer] for 3 years.  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  He lives in [place].  The patient is alone today.  Allergies:  Allergies  Allergen  Reactions  . Penicillins Rash    Current Medications: Current Outpatient Prescriptions  Medication Sig Dispense Refill  . amLODipine (NORVASC) 10 MG tablet take 1 tablet by mouth once daily for blood pressure  0  . aspirin 81 MG tablet Take 81 mg by mouth daily.    . benazepril (LOTENSIN) 40 MG tablet   0  . Chlorpheniramine-Phenylephrine 4-10 MG tablet Take 1 tablet by mouth every 4 (four) hours as needed for congestion.    . clopidogrel (PLAVIX) 75 MG tablet Take 75 mg by mouth daily.  0  . metoprolol succinate (TOPROL-XL) 25 MG 24 hr tablet take 1 tablet by mouth once daily for blood pressure  0  . traMADol (ULTRAM) 50 MG tablet Take 50 mg by mouth every 12 (twelve) hours as needed.    . cephALEXin (KEFLEX) 250 MG capsule take 3 capsules by mouth three times a day for 7 days  0   No current facility-administered medications for this visit.    Review of Systems:  GENERAL:  Feels good.  Active.  No fevers or sweats.  Weight up 5 pounds. PERFORMANCE STATUS (ECOG):  2 HEENT:  Gum disease (losing teeth).  No visual changes, sore throat, mouth sores or tenderness.  Lungs: No shortness of breath.  Morning cough.  No hemoptysis.  No sleep apnea. Cardiac:  No chest pain, palpitations, orthopnea, or PND. GI:  Good appetite.  No nausea, vomiting, diarrhea, constipation or melena.  Blood in stool.  No prior colonoscopy. GU:  No urgency, frequency, dysuria, or hematuria. Musculoskeletal:  No back pain.  Joint pain (knees and fingers).  No muscle tenderness. Extremities:  No pain or swelling. Skin:  No rashes or skin changes. Neuro:  h/o CVA.  No headache, numbness or weakness, balance or coordination issues. Endocrine:  No diabetes, thyroid issues, hot flashes or night sweats. Psych:  Anxious.  No mood changes, depression or anxiety. Pain:  No focal pain. Review of systems:  All other systems reviewed and found to be negative.  Physical Exam: Blood pressure 139/80, pulse 74, temperature  96.5 F (35.8 C), temperature source Tympanic, resp. rate 17, height 5\' 10"  (1.778 m), weight 154 lb 15.7 oz (70.3 kg). GENERAL:  Well developed, well nourished, sitting comfortably in the exam room in no acute distress. MENTAL STATUS:  Alert and oriented to person, place and time. HEAD:  Wearing a cap.  Gray hair and beard.  Normocephalic, atraumatic, face symmetric, no Cushingoid features. EYES:  Glasses.  Brown eyes.  Bilateral arcus senilis.  Pupils equal round and reactive to light and accomodation. No conjunctivitis or scleral icterus. ENT: Oropharynx clear without lesion. Poor dentition. Tongue normal. Mucous membranes moist.  RESPIRATORY: Clear to auscultation without rales, wheezes or rhonchi. CARDIOVASCULAR: Regular rate and rhythm without murmur, rub or gallop. ABDOMEN: Soft, non-tender, with active bowel sounds, and no hepatosplenomegaly. No masses. SKIN: No rashes, ulcers or lesions. EXTREMITIES: No edema, no skin discoloration or tenderness. No palpable cords. LYMPH NODES: No palpable cervical, supraclavicular, axillary or inguinal adenopathy  NEUROLOGICAL: Decreased left nasolabial fold. PSYCH: Appropriate. ] Appointment on 05/05/2016  Component Date Value Ref Range Status  . WBC 05/05/2016 4.2  3.8 - 10.6 K/uL Final  . RBC 05/05/2016 5.21  4.40 - 5.90 MIL/uL Final  . Hemoglobin 05/05/2016 16.2  13.0 - 18.0 g/dL Final  . HCT 05/05/2016 47.9  40.0 - 52.0 % Final  . MCV 05/05/2016 91.8  80.0 - 100.0 fL Final  . MCH 05/05/2016 31.1  26.0 - 34.0 pg Final  . MCHC 05/05/2016 33.9  32.0 - 36.0 g/dL Final  . RDW 05/05/2016 14.1  11.5 - 14.5 % Final  . Platelets 05/05/2016 146* 150 - 440 K/uL Final  . Neutrophils Relative % 05/05/2016 47   Final  . Neutro Abs 05/05/2016 2.0  1.4 - 6.5 K/uL Final  . Lymphocytes Relative 05/05/2016 38   Final  . Lymphs Abs 05/05/2016 1.6  1.0 - 3.6 K/uL Final  . Monocytes Relative 05/05/2016 13   Final  . Monocytes Absolute 05/05/2016  0.6  0.2 - 1.0 K/uL Final  . Eosinophils Relative 05/05/2016 1   Final  . Eosinophils Absolute 05/05/2016 0.1  0 - 0.7 K/uL Final  . Basophils Relative 05/05/2016 1   Final  . Basophils Absolute 05/05/2016 0.0  0 - 0.1 K/uL Final    Assessment:  Ian Conley is a 65 y.o. male with polycythemia first noted on 03/10/2016.  He has a 48 pack year smoking history.  He denies any cardiac disease, sleep apnea, or testosterone use.  He had a CVA in 09/01/2007.  Work-up on 03/19/2016 revealed a hematocrit of 57.3, hemoglobin 19.5, MCV 91.5, platelets 144,000, and WBC 5000.  Erythropoietin level was 3.0 (2.6-18.5).  JAK2 was negative for V617F and exon 12.  Ferritin was 181.  Iron studies revealed a saturation of 41% and a TIBC of 333.  Low dose spiral chest CT scan on 03/25/2016 was negative.  There was notation of a thickened area in the distal stomach or pyloric region.  He has an elevated alkaline phosphatase (139).  PSA was 0.4.  He has never had a colonoscopy.  He underwent small volume trial phlebotomy (04/07/2016 - 04/28/2016).  Hemoglobin has decreased from 18.6 to 16.2.  Symptomatically, he feels good.  Exam is stable.  He continues to smoke.  Plan: 1.  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  Repeat epo level today.  Review plan for phlebotomy given history of CVA.  Goal hematocrit is 45 for PV and normal range for secondary erythrocytosis.   2.  Labs today:  epo level. 3.  Encourage smoking cessation. 4.  No phlebotomy today. 5.  Discuss GI evaluation for EGD and colonoscopy.  Patient would like to go to the [facility]. 6.  RTC in 1 month for MD assessment, labs (CBC with diff) +/- phlebotomy.   Lequita Asal, MD  05/05/2016, 10:40 AM

## 2016-05-06 LAB — ERYTHROPOIETIN: Erythropoietin: 6.8 m[IU]/mL (ref 2.6–18.5)

## 2016-05-17 ENCOUNTER — Encounter: Payer: Self-pay | Admitting: Hematology and Oncology

## 2016-06-04 ENCOUNTER — Other Ambulatory Visit: Payer: Self-pay | Admitting: Hematology and Oncology

## 2016-06-04 ENCOUNTER — Inpatient Hospital Stay: Payer: Medicare Other | Attending: Hematology and Oncology

## 2016-06-04 ENCOUNTER — Inpatient Hospital Stay (HOSPITAL_BASED_OUTPATIENT_CLINIC_OR_DEPARTMENT_OTHER): Payer: Medicare Other | Admitting: Hematology and Oncology

## 2016-06-04 ENCOUNTER — Inpatient Hospital Stay: Payer: Medicare Other

## 2016-06-04 VITALS — BP 152/83 | HR 71 | Temp 97.6°F | Resp 17 | Ht 70.0 in | Wt 155.1 lb

## 2016-06-04 DIAGNOSIS — Z87820 Personal history of traumatic brain injury: Secondary | ICD-10-CM | POA: Diagnosis not present

## 2016-06-04 DIAGNOSIS — D751 Secondary polycythemia: Secondary | ICD-10-CM | POA: Insufficient documentation

## 2016-06-04 DIAGNOSIS — F17210 Nicotine dependence, cigarettes, uncomplicated: Secondary | ICD-10-CM

## 2016-06-04 DIAGNOSIS — Z79899 Other long term (current) drug therapy: Secondary | ICD-10-CM

## 2016-06-04 DIAGNOSIS — R748 Abnormal levels of other serum enzymes: Secondary | ICD-10-CM | POA: Insufficient documentation

## 2016-06-04 DIAGNOSIS — I10 Essential (primary) hypertension: Secondary | ICD-10-CM | POA: Diagnosis not present

## 2016-06-04 DIAGNOSIS — D45 Polycythemia vera: Secondary | ICD-10-CM

## 2016-06-04 LAB — CBC WITH DIFFERENTIAL/PLATELET
Basophils Absolute: 0 10*3/uL (ref 0–0.1)
Basophils Relative: 1 %
Eosinophils Absolute: 0.1 10*3/uL (ref 0–0.7)
Eosinophils Relative: 2 %
HCT: 51.5 % (ref 40.0–52.0)
Hemoglobin: 17.5 g/dL (ref 13.0–18.0)
Lymphocytes Relative: 41 %
Lymphs Abs: 1.5 10*3/uL (ref 1.0–3.6)
MCH: 31.1 pg (ref 26.0–34.0)
MCHC: 33.9 g/dL (ref 32.0–36.0)
MCV: 91.7 fL (ref 80.0–100.0)
Monocytes Absolute: 0.6 10*3/uL (ref 0.2–1.0)
Monocytes Relative: 16 %
Neutro Abs: 1.4 10*3/uL (ref 1.4–6.5)
Neutrophils Relative %: 40 %
Platelets: 125 10*3/uL — ABNORMAL LOW (ref 150–440)
RBC: 5.61 MIL/uL (ref 4.40–5.90)
RDW: 13.7 % (ref 11.5–14.5)
WBC: 3.6 10*3/uL — ABNORMAL LOW (ref 3.8–10.6)

## 2016-06-04 NOTE — Progress Notes (Signed)
[facility] day:  06/04/2016   Chief Complaint: Ian Conley is a 65 y.o. male with an elevated hemoglobin who is seen for 1 month assessment.  HPI:   The patient was last seen in the medical oncology clinic on 05/05/2016.  At that time, he felt good.  Exam was stable.  He continued to smoke.  Repeat epo level was 6.8 (2.6-18.5).  Hematocrit was 47.9 and hemoglobin 16.2.  He did not undergo a phlebotomy.  We discussed a GI evaluation for EGD and colonoscopy.  Patient was planning to go to the [facility].  During the interim, he notes that he has slowed down on his smoking.  He comments that he needs to go to the dentist.  He has a gum problem and is losing teeth.  He is having trouble eating.  He plans to go to the [facility] after this is fixed.   Past Medical History  Diagnosis Date  . Hypertension   . Stroke [name])     2008 9/11  . Allergy   . Personal history of tobacco use, presenting hazards to health 03/25/2016    No past surgical history on file.  Family History  Problem Relation Age of Onset  . Diabetes Mother   . Cancer Father     Throat   . HIV Brother     Social History:  reports that he has been smoking Cigarettes.  He has a 45 pack-year smoking history. He does not have any smokeless tobacco history on file. He reports that he drinks about 12.6 oz of alcohol per week. He reports that he does not use illicit drugs.  He started smoking between the age of [age].  He smoked 1 pack a day for 48 years.  He drinks 2- 24 ounce beers/day.  He was in [employer] for 3 years.  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  He lives in [place].  The patient is alone today.  Allergies:  Allergies  Allergen Reactions  . Penicillins Rash    Current Medications: Current Outpatient Prescriptions  Medication Sig Dispense Refill  . amLODipine (NORVASC) 10 MG tablet take 1 tablet by mouth once daily for blood pressure  0   . aspirin 81 MG tablet Take 81 mg by mouth daily.    . benazepril (LOTENSIN) 40 MG tablet   0  . cephALEXin (KEFLEX) 250 MG capsule take 3 capsules by mouth three times a day for 7 days  0  . Chlorpheniramine-Phenylephrine 4-10 MG tablet Take 1 tablet by mouth every 4 (four) hours as needed for congestion.    . clopidogrel (PLAVIX) 75 MG tablet Take 75 mg by mouth daily.  0  . metoprolol succinate (TOPROL-XL) 25 MG 24 hr tablet take 1 tablet by mouth once daily for blood pressure  0  . traMADol (ULTRAM) 50 MG tablet Take 50 mg by mouth every 12 (twelve) hours as needed. Reported on 06/04/2016     No current facility-administered medications for this visit.    Review of Systems:  GENERAL:  Feels "ok".  No fevers or sweats.  Weight up 1 pound. PERFORMANCE STATUS (ECOG):  2 HEENT:  Gum disease (losing teeth).  No visual changes, sore throat, mouth sores or tenderness. Lungs: No shortness of breath.  Morning cough.  No hemoptysis.  No sleep apnea. Cardiac:  No chest pain, palpitations, orthopnea, or PND. GI:  Appetite 75%.  Teeth inhibit eating well.  No nausea, vomiting, diarrhea, constipation or melena.  Blood in stool.  No prior colonoscopy. GU:  No urgency, frequency, dysuria, or hematuria. Musculoskeletal:  No back pain.  Joint pain (knees and fingers).  No muscle tenderness. Extremities:  No pain or swelling. Skin:  No rashes or skin changes. Neuro:  h/o CVA.  No headache, numbness or weakness, balance or coordination issues. Endocrine:  No diabetes, thyroid issues, hot flashes or night sweats. Psych:  Anxious.  No mood changes, depression or anxiety. Pain:  No focal pain. Review of systems:  All other systems reviewed and found to be negative.  Physical Exam: Blood pressure 152/83, pulse 71, temperature 97.6 F (36.4 C), temperature source Tympanic, resp. rate 17, height 5\' 10"  (1.778 m), weight 155 lb 1.5 oz (70.35 kg). GENERAL:  Well developed, well nourished, gentleman sitting  comfortably in the exam room in no acute distress. MENTAL STATUS:  Alert and oriented to person, place and time. HEAD:  Wearing a black cap.  Gray hair and beard.  Normocephalic, atraumatic, face symmetric, no Cushingoid features. EYES:  Glasses.  Brown eyes.  Bilateral arcus senilis.  Pupils equal round and reactive to light and accomodation. No conjunctivitis or scleral icterus. ENT: Oropharynx clear without lesion. Poor dentition. Tongue normal. Mucous membranes moist.  RESPIRATORY: Clear to auscultation without rales, wheezes or rhonchi. CARDIOVASCULAR: Regular rate and rhythm without murmur, rub or gallop. ABDOMEN: Soft, non-tender, with active bowel sounds, and no hepatosplenomegaly. No masses. SKIN: No rashes, ulcers or lesions. EXTREMITIES: No edema, no skin discoloration or tenderness. No palpable cords. LYMPH NODES: No palpable cervical, supraclavicular, axillary or inguinal adenopathy  NEUROLOGICAL: Decreased left nasolabial fold. PSYCH: Appropriate. ] Appointment on 06/04/2016  Component Date Value Ref Range Status  . WBC 06/04/2016 3.6* 3.8 - 10.6 K/uL Final  . RBC 06/04/2016 5.61  4.40 - 5.90 MIL/uL Final  . Hemoglobin 06/04/2016 17.5  13.0 - 18.0 g/dL Final  . HCT 06/04/2016 51.5  40.0 - 52.0 % Final  . MCV 06/04/2016 91.7  80.0 - 100.0 fL Final  . MCH 06/04/2016 31.1  26.0 - 34.0 pg Final  . MCHC 06/04/2016 33.9  32.0 - 36.0 g/dL Final  . RDW 06/04/2016 13.7  11.5 - 14.5 % Final  . Platelets 06/04/2016 125* 150 - 440 K/uL Final  . Neutrophils Relative % 06/04/2016 40   Final  . Neutro Abs 06/04/2016 1.4  1.4 - 6.5 K/uL Final  . Lymphocytes Relative 06/04/2016 41   Final  . Lymphs Abs 06/04/2016 1.5  1.0 - 3.6 K/uL Final  . Monocytes Relative 06/04/2016 16   Final  . Monocytes Absolute 06/04/2016 0.6  0.2 - 1.0 K/uL Final  . Eosinophils Relative 06/04/2016 2   Final  . Eosinophils Absolute 06/04/2016 0.1  0 - 0.7 K/uL Final  . Basophils Relative 06/04/2016  1   Final  . 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  He has a 48 pack year smoking history.  He denies any cardiac disease, sleep apnea, or testosterone use.  He had a CVA in 09/01/2007.  Work-up on 03/19/2016 revealed a hematocrit of 57.3, hemoglobin 19.5, MCV 91.5, platelets 144,000, and WBC 5000.  Erythropoietin level was 3.0 (2.6-18.5).  Repeat epo level was 6.8 on 05/05/2016.  JAK2 was negative for V617F and exon 12.  Ferritin was 181.  Iron studies revealed a saturation of 41% and a TIBC of 333.  Low dose spiral chest CT scan on 03/25/2016 was negative.  There was notation of a thickened area in the distal stomach or pyloric region.  He has an elevated alkaline phosphatase (139).  PSA was 0.4.  He has never had a colonoscopy.  He undergoes small volume phlebotomy (250-300 cc) if his hematocrit is > 52.  He has undergone phlebotomy x 3 (04/07/2016, 04/21/2016, 04/28/2016).    Symptomatically, he has dental issues.  Exam is stable.  He continues to smoke.  Plan: 1.  Labs today:  CBC with diff. 2.  Discuss epo level.  Level normal epo level and negative JAK2 testing not consistent with PV.  Discuss secondary polycythemia.  Discuss plan for small phlebotomy for hematocrit > 52 given history of prior CVA. 3.  Encourage smoking cessation. 4.  No phlebotomy today. 5.  Encourage GI evaluation for EGD and colonoscopy.  Patient plans to go to [facility]. 6.  RTC in 1 week for Hgb +/- phlebotomy 7.  RTC in 5 weeks for Hgb +/- phlebotomy 8.  RTC in 9 weeks for Hgb +/- phlebotomy 9.  RTC in 13 weeks for MD assess, labs (CBC with diff, ferritin) +/- phlebotomy.   Lequita Asal, MD  06/04/2016, 9:57 AM

## 2016-06-04 NOTE — Progress Notes (Signed)
Working on smoking less but still drink a couple beers a day.  Taking anitbiotic for "hair bump" per pt.  Reports a cough related to allergies.  Sore from helping neighbor a few days ago leveling stairs

## 2016-06-10 ENCOUNTER — Other Ambulatory Visit: Payer: Self-pay | Admitting: Hematology and Oncology

## 2016-06-11 ENCOUNTER — Inpatient Hospital Stay: Payer: Medicare Other

## 2016-06-11 ENCOUNTER — Other Ambulatory Visit: Payer: Self-pay | Admitting: *Deleted

## 2016-06-11 DIAGNOSIS — Z87820 Personal history of traumatic brain injury: Secondary | ICD-10-CM | POA: Diagnosis not present

## 2016-06-11 DIAGNOSIS — D751 Secondary polycythemia: Secondary | ICD-10-CM | POA: Diagnosis not present

## 2016-06-11 DIAGNOSIS — I10 Essential (primary) hypertension: Secondary | ICD-10-CM | POA: Diagnosis not present

## 2016-06-11 DIAGNOSIS — R748 Abnormal levels of other serum enzymes: Secondary | ICD-10-CM | POA: Diagnosis not present

## 2016-06-11 DIAGNOSIS — Z79899 Other long term (current) drug therapy: Secondary | ICD-10-CM | POA: Diagnosis not present

## 2016-06-11 LAB — HEMOGLOBIN: Hemoglobin: 17.5 g/dL (ref 13.0–18.0)

## 2016-06-11 LAB — HEMATOCRIT: HCT: 50.5 % (ref 40.0–52.0)

## 2016-07-09 ENCOUNTER — Inpatient Hospital Stay: Payer: Medicare Other

## 2016-07-09 ENCOUNTER — Inpatient Hospital Stay: Payer: Medicare Other | Attending: Hematology and Oncology

## 2016-07-09 VITALS — BP 130/75 | HR 65 | Temp 98.6°F | Resp 18

## 2016-07-09 DIAGNOSIS — D751 Secondary polycythemia: Secondary | ICD-10-CM | POA: Diagnosis not present

## 2016-07-09 LAB — HEMOGLOBIN: Hemoglobin: 17.9 g/dL (ref 13.0–18.0)

## 2016-07-17 DIAGNOSIS — F17210 Nicotine dependence, cigarettes, uncomplicated: Secondary | ICD-10-CM | POA: Diagnosis not present

## 2016-07-17 DIAGNOSIS — J069 Acute upper respiratory infection, unspecified: Secondary | ICD-10-CM | POA: Diagnosis not present

## 2016-07-17 DIAGNOSIS — I679 Cerebrovascular disease, unspecified: Secondary | ICD-10-CM | POA: Diagnosis not present

## 2016-07-17 DIAGNOSIS — I10 Essential (primary) hypertension: Secondary | ICD-10-CM | POA: Diagnosis not present

## 2016-08-05 ENCOUNTER — Other Ambulatory Visit: Payer: Self-pay | Admitting: Hematology and Oncology

## 2016-08-06 ENCOUNTER — Other Ambulatory Visit: Payer: Self-pay | Admitting: *Deleted

## 2016-08-06 ENCOUNTER — Inpatient Hospital Stay: Payer: Medicare Other

## 2016-08-06 ENCOUNTER — Inpatient Hospital Stay: Payer: Medicare Other | Attending: Hematology and Oncology

## 2016-08-06 VITALS — BP 137/73 | HR 67 | Temp 97.4°F | Resp 20

## 2016-08-06 DIAGNOSIS — D751 Secondary polycythemia: Secondary | ICD-10-CM

## 2016-08-06 LAB — HEMOGLOBIN: Hemoglobin: 16.8 g/dL (ref 13.0–18.0)

## 2016-08-06 LAB — HEMATOCRIT: HCT: 48.7 % (ref 40.0–52.0)

## 2016-09-04 ENCOUNTER — Other Ambulatory Visit: Payer: Self-pay | Admitting: *Deleted

## 2016-09-04 ENCOUNTER — Inpatient Hospital Stay: Payer: Medicare Other | Attending: Hematology and Oncology | Admitting: Hematology and Oncology

## 2016-09-04 ENCOUNTER — Inpatient Hospital Stay: Payer: Medicare Other

## 2016-09-04 ENCOUNTER — Encounter: Payer: Self-pay | Admitting: Hematology and Oncology

## 2016-09-04 DIAGNOSIS — Z8673 Personal history of transient ischemic attack (TIA), and cerebral infarction without residual deficits: Secondary | ICD-10-CM | POA: Diagnosis not present

## 2016-09-04 DIAGNOSIS — F17210 Nicotine dependence, cigarettes, uncomplicated: Secondary | ICD-10-CM | POA: Diagnosis not present

## 2016-09-04 DIAGNOSIS — D751 Secondary polycythemia: Secondary | ICD-10-CM

## 2016-09-04 DIAGNOSIS — Z7982 Long term (current) use of aspirin: Secondary | ICD-10-CM | POA: Insufficient documentation

## 2016-09-04 DIAGNOSIS — I10 Essential (primary) hypertension: Secondary | ICD-10-CM | POA: Insufficient documentation

## 2016-09-04 DIAGNOSIS — Z79899 Other long term (current) drug therapy: Secondary | ICD-10-CM | POA: Diagnosis not present

## 2016-09-04 LAB — CBC WITH DIFFERENTIAL/PLATELET
Basophils Absolute: 0 10*3/uL (ref 0–0.1)
Basophils Relative: 0 %
Eosinophils Absolute: 0.1 10*3/uL (ref 0–0.7)
Eosinophils Relative: 1 %
HCT: 42.7 % (ref 40.0–52.0)
Hemoglobin: 14.7 g/dL (ref 13.0–18.0)
Lymphocytes Relative: 41 %
Lymphs Abs: 2 10*3/uL (ref 1.0–3.6)
MCH: 30.2 pg (ref 26.0–34.0)
MCHC: 34.4 g/dL (ref 32.0–36.0)
MCV: 87.8 fL (ref 80.0–100.0)
Monocytes Absolute: 0.6 10*3/uL (ref 0.2–1.0)
Monocytes Relative: 12 %
Neutro Abs: 2.2 10*3/uL (ref 1.4–6.5)
Neutrophils Relative %: 46 %
Platelets: 145 10*3/uL — ABNORMAL LOW (ref 150–440)
RBC: 4.86 MIL/uL (ref 4.40–5.90)
RDW: 13.7 % (ref 11.5–14.5)
WBC: 4.9 10*3/uL (ref 3.8–10.6)

## 2016-09-04 LAB — FERRITIN: Ferritin: 306 ng/mL (ref 24–336)

## 2016-09-04 NOTE — Progress Notes (Signed)
[place] Clinic day:  09/04/16   Chief Complaint: [name] is a 65 y.o. male with an elevated hemoglobin who is seen for 3 month assessment.  HPI:   The patient was last seen in the medical oncology clinic on 06/04/2016.  At that time, he was cutting back on his smoking.  He had dental issues and was not ready to be seen by gastroenterology until his dental issues were resolved.  He was to make an appointment at the [facility].  He underwent phlebotomy (300 cc) on 07/09/2016 and 08/06/2016.  During the interim, he has cut back his smoking to 3/4 pack a day.  He has not addressed his dental issues.  He has not made an appointment with gastroenterology.   Past Medical History:  Diagnosis Date  . Allergy   . Hypertension   . Personal history of tobacco use, presenting hazards to health 03/25/2016  . Stroke [facility])    2008 9/11    No past surgical history on file.  Family History  Problem Relation Age of Onset  . Diabetes Mother   . Cancer Father     Throat   . HIV Brother     Social History:  reports that he has been smoking Cigarettes.  He has a 45.00 pack-year smoking history. He does not have any smokeless tobacco history on file. He reports that he drinks about 12.6 oz of alcohol per week . He reports that he does not use drugs.  He started smoking between the age of [age].  He smoked 1 pack a day for 48 years.  He drinks 2- 24 ounce beers/day.  He was in [employer] for 3 years.  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  He lives in [place].  The patient is alone today.  Allergies:  Allergies  Allergen Reactions  . Penicillins Rash    Current Medications: Current Outpatient Prescriptions  Medication Sig Dispense Refill  . amLODipine (NORVASC) 10 MG tablet take 1 tablet by mouth once daily for blood pressure  0  . aspirin 81 MG tablet Take 81 mg by mouth daily.    . clopidogrel (PLAVIX) 75 MG tablet     .  metoprolol succinate (TOPROL-XL) 25 MG 24 hr tablet take 1 tablet by mouth once daily for blood pressure  0   No current facility-administered medications for this visit.     Review of Systems:  GENERAL:  Feels good.  No fevers or sweats.  Weight up 1 pound. PERFORMANCE STATUS (ECOG):  2 HEENT:  Gum disease (losing teeth).  Plans on getting all teeth extracted.  No visual changes, sore throat, mouth sores or tenderness. Lungs: No shortness of breath.  Morning cough.  No hemoptysis.  No sleep apnea. Cardiac:  No chest pain, palpitations, orthopnea, or PND. GI:  Appetite 75%.  Teeth inhibit eating.  No nausea, vomiting, diarrhea, constipation or melena.  Blood in stool.  No prior colonoscopy. GU:  No urgency, frequency, dysuria, or hematuria. Musculoskeletal:  No back pain.  Joint pain (knees and fingers).  No muscle tenderness. Extremities:  No pain or swelling. Skin:  No rashes or skin changes. Neuro:  h/o CVA.  No headache, numbness or weakness, balance or coordination issues. Endocrine:  No diabetes, thyroid issues, hot flashes or night sweats. Psych:  Anxious.  No mood changes, depression or anxiety. Pain:  No focal pain. Review of systems:  All  other systems reviewed and found to be negative.  Physical Exam: Blood pressure 124/74, pulse 71, temperature 98.7 F (37.1 C), temperature source Tympanic, resp. rate 18, weight 154 lb 5.2 oz (70 kg). GENERAL:  Well developed, well nourished, gentleman sitting comfortably in the exam room in no acute distress. MENTAL STATUS:  Alert and oriented to person, place and time. HEAD:  Wearing a cap.  Gray hair and beard.  Normocephalic, atraumatic, face symmetric, no Cushingoid features. EYES:  Glasses.  Brown eyes.  Bilateral arcus senilis.  Pupils equal round and reactive to light and accomodation. No conjunctivitis or scleral icterus. ENT: Oropharynx clear without lesion. Poor dentition. Tongue normal. Mucous membranes moist.  RESPIRATORY:  Clear to auscultation without rales, wheezes or rhonchi. CARDIOVASCULAR: Regular rate and rhythm without murmur, rub or gallop. ABDOMEN: Soft, non-tender, with active bowel sounds, and no hepatosplenomegaly. No masses. SKIN: No rashes, ulcers or lesions. EXTREMITIES: No edema, no skin discoloration or tenderness. No palpable cords. LYMPH NODES: No palpable cervical, supraclavicular, axillary or inguinal adenopathy  NEUROLOGICAL: Decreased left nasolabial fold. PSYCH: Appropriate.   Appointment on 09/04/2016  Component Date Value Ref Range Status  . WBC 09/04/2016 4.9  3.8 - 10.6 K/uL Final  . RBC 09/04/2016 4.86  4.40 - 5.90 MIL/uL Final  . Hemoglobin 09/04/2016 14.7  13.0 - 18.0 g/dL Final  . HCT 09/04/2016 42.7  40.0 - 52.0 % Final  . MCV 09/04/2016 87.8  80.0 - 100.0 fL Final  . MCH 09/04/2016 30.2  26.0 - 34.0 pg Final  . MCHC 09/04/2016 34.4  32.0 - 36.0 g/dL Final  . RDW 09/04/2016 13.7  11.5 - 14.5 % Final  . Platelets 09/04/2016 145* 150 - 440 K/uL Final  . Neutrophils Relative % 09/04/2016 46  % Final  . Neutro Abs 09/04/2016 2.2  1.4 - 6.5 K/uL Final  . Lymphocytes Relative 09/04/2016 41  % Final  . Lymphs Abs 09/04/2016 2.0  1.0 - 3.6 K/uL Final  . Monocytes Relative 09/04/2016 12  % Final  . Monocytes Absolute 09/04/2016 0.6  0.2 - 1.0 K/uL Final  . Eosinophils Relative 09/04/2016 1  % Final  . Eosinophils Absolute 09/04/2016 0.1  0 - 0.7 K/uL Final  . Basophils Relative 09/04/2016 0  % Final  . 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  He has a 48 pack year smoking history.  He denies any cardiac disease, sleep apnea, or testosterone use.  He had a CVA in 09/01/2007.  Work-up on 03/19/2016 revealed a hematocrit of 57.3, hemoglobin 19.5, MCV 91.5, platelets 144,000, and WBC 5000.  Erythropoietin level was 3.0 (2.6-18.5).  Repeat epo level was 6.8  on 05/05/2016.  JAK2 was negative for V617F and exon 12.  Ferritin was 181.  Iron studies revealed a saturation of 41% and a TIBC of 333.  Low dose spiral chest CT scan on 03/25/2016 was negative.  There was notation of a thickened area in the distal stomach or pyloric region.  He has an elevated alkaline phosphatase (139).  PSA was 0.4.  He has never had a colonoscopy.  He undergoes small volume phlebotomy (250-300 cc) if his hematocrit is > 52.  Last phlebotomy was 07/09/2016.  Symptomatically, he has dental issues.  Exam is stable.  He has decreased his smoking.  Plan: 1.  Labs today:  CBC with diff. 2.  No  phlebotomy today. 3.  Encourage patient to stop smoking. 4.  Encourage patient proceed with dental evaluation and make an appointment at the [facility] for GI evaluation (EGD + colonoscopy). 5.  RTC in 2 months for labs (CBC) 6.  RTC in 4 months for MD assessment and labs (CBC with diff, ferritin) +/- phlebotomy.   Lequita Asal, MD  09/04/2016, 11:54 AM

## 2016-09-05 ENCOUNTER — Encounter: Payer: Self-pay | Admitting: Hematology and Oncology

## 2016-09-05 DIAGNOSIS — D751 Secondary polycythemia: Secondary | ICD-10-CM | POA: Insufficient documentation

## 2016-09-08 ENCOUNTER — Other Ambulatory Visit: Payer: Self-pay | Admitting: *Deleted

## 2016-09-08 DIAGNOSIS — D751 Secondary polycythemia: Secondary | ICD-10-CM

## 2016-11-04 ENCOUNTER — Other Ambulatory Visit: Payer: Medicare Other

## 2016-11-05 ENCOUNTER — Inpatient Hospital Stay: Payer: Medicare Other | Attending: Hematology and Oncology

## 2016-11-05 DIAGNOSIS — D751 Secondary polycythemia: Secondary | ICD-10-CM | POA: Diagnosis not present

## 2016-11-05 DIAGNOSIS — Z79899 Other long term (current) drug therapy: Secondary | ICD-10-CM | POA: Insufficient documentation

## 2016-11-05 DIAGNOSIS — Z8673 Personal history of transient ischemic attack (TIA), and cerebral infarction without residual deficits: Secondary | ICD-10-CM | POA: Insufficient documentation

## 2016-11-05 DIAGNOSIS — I10 Essential (primary) hypertension: Secondary | ICD-10-CM | POA: Insufficient documentation

## 2016-11-05 DIAGNOSIS — Z7982 Long term (current) use of aspirin: Secondary | ICD-10-CM | POA: Insufficient documentation

## 2016-11-05 DIAGNOSIS — F17210 Nicotine dependence, cigarettes, uncomplicated: Secondary | ICD-10-CM | POA: Insufficient documentation

## 2016-11-05 LAB — CBC WITH DIFFERENTIAL/PLATELET
Basophils Absolute: 0 10*3/uL (ref 0–0.1)
Basophils Relative: 1 %
Eosinophils Absolute: 0 10*3/uL (ref 0–0.7)
Eosinophils Relative: 1 %
HCT: 47.5 % (ref 40.0–52.0)
Hemoglobin: 16.1 g/dL (ref 13.0–18.0)
Lymphocytes Relative: 27 %
Lymphs Abs: 1.4 10*3/uL (ref 1.0–3.6)
MCH: 30.6 pg (ref 26.0–34.0)
MCHC: 33.8 g/dL (ref 32.0–36.0)
MCV: 90.7 fL (ref 80.0–100.0)
Monocytes Absolute: 0.5 10*3/uL (ref 0.2–1.0)
Monocytes Relative: 11 %
Neutro Abs: 3 10*3/uL (ref 1.4–6.5)
Neutrophils Relative %: 60 %
Platelets: 140 10*3/uL — ABNORMAL LOW (ref 150–440)
RBC: 5.24 MIL/uL (ref 4.40–5.90)
RDW: 13.8 % (ref 11.5–14.5)
WBC: 5 10*3/uL (ref 3.8–10.6)

## 2016-12-10 DIAGNOSIS — D45 Polycythemia vera: Secondary | ICD-10-CM | POA: Diagnosis not present

## 2016-12-10 DIAGNOSIS — I10 Essential (primary) hypertension: Secondary | ICD-10-CM | POA: Diagnosis not present

## 2016-12-10 DIAGNOSIS — J019 Acute sinusitis, unspecified: Secondary | ICD-10-CM | POA: Diagnosis not present

## 2016-12-10 DIAGNOSIS — B370 Candidal stomatitis: Secondary | ICD-10-CM | POA: Diagnosis not present

## 2016-12-10 DIAGNOSIS — I679 Cerebrovascular disease, unspecified: Secondary | ICD-10-CM | POA: Diagnosis not present

## 2017-01-07 ENCOUNTER — Inpatient Hospital Stay: Payer: Medicare Other | Admitting: Hematology and Oncology

## 2017-01-07 ENCOUNTER — Inpatient Hospital Stay: Payer: Medicare Other

## 2017-01-14 ENCOUNTER — Other Ambulatory Visit: Payer: Self-pay | Admitting: Hematology and Oncology

## 2017-01-14 ENCOUNTER — Inpatient Hospital Stay: Payer: Medicare Other | Attending: Hematology and Oncology

## 2017-01-14 ENCOUNTER — Inpatient Hospital Stay (HOSPITAL_BASED_OUTPATIENT_CLINIC_OR_DEPARTMENT_OTHER): Payer: Medicare Other | Admitting: Hematology and Oncology

## 2017-01-14 ENCOUNTER — Inpatient Hospital Stay: Payer: Medicare Other

## 2017-01-14 VITALS — BP 135/73 | HR 98 | Temp 98.0°F | Resp 18 | Wt 161.8 lb

## 2017-01-14 DIAGNOSIS — F17210 Nicotine dependence, cigarettes, uncomplicated: Secondary | ICD-10-CM | POA: Diagnosis not present

## 2017-01-14 DIAGNOSIS — I10 Essential (primary) hypertension: Secondary | ICD-10-CM | POA: Insufficient documentation

## 2017-01-14 DIAGNOSIS — Z8673 Personal history of transient ischemic attack (TIA), and cerebral infarction without residual deficits: Secondary | ICD-10-CM | POA: Diagnosis not present

## 2017-01-14 DIAGNOSIS — Z79899 Other long term (current) drug therapy: Secondary | ICD-10-CM

## 2017-01-14 DIAGNOSIS — D751 Secondary polycythemia: Secondary | ICD-10-CM | POA: Insufficient documentation

## 2017-01-14 DIAGNOSIS — R748 Abnormal levels of other serum enzymes: Secondary | ICD-10-CM | POA: Diagnosis not present

## 2017-01-14 DIAGNOSIS — Z7982 Long term (current) use of aspirin: Secondary | ICD-10-CM | POA: Diagnosis not present

## 2017-01-14 LAB — CBC WITH DIFFERENTIAL/PLATELET
Basophils Absolute: 0 10*3/uL (ref 0–0.1)
Basophils Relative: 0 %
Eosinophils Absolute: 0.1 10*3/uL (ref 0–0.7)
Eosinophils Relative: 2 %
HCT: 49.1 % (ref 40.0–52.0)
Hemoglobin: 16.8 g/dL (ref 13.0–18.0)
Lymphocytes Relative: 22 %
Lymphs Abs: 1 10*3/uL (ref 1.0–3.6)
MCH: 30.2 pg (ref 26.0–34.0)
MCHC: 34.1 g/dL (ref 32.0–36.0)
MCV: 88.5 fL (ref 80.0–100.0)
Monocytes Absolute: 0.2 10*3/uL (ref 0.2–1.0)
Monocytes Relative: 4 %
Neutro Abs: 3.3 10*3/uL (ref 1.4–6.5)
Neutrophils Relative %: 72 %
Platelets: 154 10*3/uL (ref 150–440)
RBC: 5.55 MIL/uL (ref 4.40–5.90)
RDW: 13.4 % (ref 11.5–14.5)
WBC: 4.7 10*3/uL (ref 3.8–10.6)

## 2017-01-14 LAB — FERRITIN: Ferritin: 153 ng/mL (ref 24–336)

## 2017-01-14 NOTE — Progress Notes (Signed)
Patient offers no complaints today. 

## 2017-01-14 NOTE — Progress Notes (Signed)
[place] Clinic day:  01/14/17   Chief Complaint: Ian Conley is a 66 y.o. 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  HPI:   The patient was last seen in the hematology clinic on 09/04/2016.  At that time, he had dental issues.  Exam was stable.  He had decreased his smoking.  Hematocrit was 42.7 with a hemoglobin of 14.7.  He did not undergo phlebotomy.  Smoking cessation was encouraged.  He was to follow-up with dentistry and GI.  CBC on 11/05/2016 revealed a hematocrit of 47.5 with a hemoglobin of 16.1.  Symptomatically, he denies any complaints.  He states that he smokes as he doesn't work.  He has "time on my hands".  He tries to keep to < 1 pack a day.  He has not been to the [facility].   Past Medical History:  Diagnosis Date  . Allergy   . Hypertension   . Personal history of tobacco use, presenting hazards to health 03/25/2016  . Stroke [facility])    2008 9/11    No past surgical history on file.  Family History  Problem Relation Age of Onset  . Diabetes Mother   . Cancer Father     Throat   . HIV Brother     Social History:  reports that he has been smoking Cigarettes.  He has a 45.00 pack-year smoking history. He does not have any smokeless tobacco history on file. He reports that he drinks about 12.6 oz of alcohol per week . He reports that he does not use drugs.  He started smoking between the age of [age].  He smoked 1 pack a day for 48 years.  He is smoking < 1 pack a day.  He drinks 2- 24 ounce beers/day.  He was in [employer] for 3 years.  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  He lives in [place].  The patient is alone today.  Allergies:  Allergies  Allergen Reactions  . Penicillins Rash    Current Medications: Current Outpatient Prescriptions  Medication Sig Dispense Refill  . amLODipine (NORVASC) 10 MG tablet take 1 tablet by mouth once daily for blood pressure  0   . aspirin 81 MG tablet Take 81 mg by mouth daily.    . clopidogrel (PLAVIX) 75 MG tablet     . metoprolol succinate (TOPROL-XL) 25 MG 24 hr tablet take 1 tablet by mouth once daily for blood pressure  0   No current facility-administered medications for this visit.     Review of Systems:  GENERAL:  Feels good.  No fevers or sweats.  Weight up 7 pounds. PERFORMANCE STATUS (ECOG):  2 HEENT:  Gum disease (losing teeth).  Plans on getting all teeth extracted ([facility] not helping).  No visual changes, sore throat, mouth sores or tenderness. Lungs: No shortness of breath.  Morning cough.  No hemoptysis.  No sleep apnea. Cardiac:  No chest pain, palpitations, orthopnea, or PND. GI:  Teeth inhibit eating.  No nausea, vomiting, diarrhea, constipation or melena.  Blood in stool.  No prior colonoscopy. GU:  No urgency, frequency, dysuria, or hematuria. Musculoskeletal:  No back pain.  Joint pain (knees and fingers).  No muscle tenderness. Extremities:  No pain or swelling. Skin:  No rashes or skin changes. Neuro:  h/o CVA.  No headache, numbness or weakness, balance or coordination issues. Endocrine:  No diabetes,  thyroid issues, hot flashes or night sweats. Psych:  Anxious.  No mood changes, depression or anxiety. Pain:  No focal pain. Review of systems:  All other systems reviewed and found to be negative.  Physical Exam: Blood pressure 135/73, pulse 98, temperature 98 F (36.7 C), temperature source Tympanic, resp. rate 18, weight 161 lb 13.1 oz (73.4 kg). GENERAL:  Well developed, well nourished, gentleman sitting comfortably in the exam room in no acute distress. MENTAL STATUS:  Alert and oriented to person, place and time. HEAD:  Wearing a cap.  Gray hair and scruffy beard.  Normocephalic, atraumatic, face symmetric, no Cushingoid features. EYES:  Glasses.  Brown eyes.  Bilateral arcus senilis.  Pupils equal round and reactive to light and accomodation. No conjunctivitis or scleral  icterus. ENT: Oropharynx clear without lesion. Poor dentition. Tongue normal. Mucous membranes moist.  RESPIRATORY: Clear to auscultation without rales, wheezes or rhonchi. CARDIOVASCULAR: Regular rate and rhythm without murmur, rub or gallop. ABDOMEN: Soft, non-tender, with active bowel sounds, and no hepatosplenomegaly. No masses. SKIN: No rashes, ulcers or lesions. EXTREMITIES: No edema, no skin discoloration or tenderness. No palpable cords. LYMPH NODES: No palpable cervical, supraclavicular, axillary or inguinal adenopathy  NEUROLOGICAL: Decreased left nasolabial fold. PSYCH: Appropriate.   Appointment on 01/14/2017  Component Date Value Ref Range Status  . WBC 01/14/2017 4.7  3.8 - 10.6 K/uL Final  . RBC 01/14/2017 5.55  4.40 - 5.90 MIL/uL Final  . Hemoglobin 01/14/2017 16.8  13.0 - 18.0 g/dL Final  . HCT 01/14/2017 49.1  40.0 - 52.0 % Final  . MCV 01/14/2017 88.5  80.0 - 100.0 fL Final  . MCH 01/14/2017 30.2  26.0 - 34.0 pg Final  . MCHC 01/14/2017 34.1  32.0 - 36.0 g/dL Final  . RDW 01/14/2017 13.4  11.5 - 14.5 % Final  . Platelets 01/14/2017 154  150 - 440 K/uL Final  . Neutrophils Relative % 01/14/2017 72  % Final  . Neutro Abs 01/14/2017 3.3  1.4 - 6.5 K/uL Final  . Lymphocytes Relative 01/14/2017 22  % Final  . Lymphs Abs 01/14/2017 1.0  1.0 - 3.6 K/uL Final  . Monocytes Relative 01/14/2017 4  % Final  . Monocytes Absolute 01/14/2017 0.2  0.2 - 1.0 K/uL Final  . Eosinophils Relative 01/14/2017 2  % Final  . Eosinophils Absolute 01/14/2017 0.1  0 - 0.7 K/uL Final  . Basophils Relative 01/14/2017 0  % Final  . Basophils Absolute 01/14/2017 0.0  0 - 0.1 K/uL Final    Assessment:  Ian Conley is a 66 y.o. 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  He has a 48 pack year smoking history.  He denies any cardiac disease, sleep apnea, or testosterone use.  He had a CVA in 09/01/2007.  Work-up on 03/19/2016 revealed a hematocrit of 57.3,  hemoglobin 19.5, MCV 91.5, platelets 144,000, and WBC 5000.  Erythropoietin level was 3.0 (2.6-18.5).  Repeat epo level was 6.8 on 05/05/2016.  JAK2 was negative for V617F and exon 12.  Ferritin was 181.  Iron studies revealed a saturation of 41% and a TIBC of 333.  Low dose spiral chest CT scan on 03/25/2016 was negative.  There was notation of a thickened area in the distal stomach or pyloric region.  He has an elevated alkaline phosphatase (139).  PSA was 0.4.  He has never had a colonoscopy.  He undergoes small volume phlebotomy (250-300 cc) if his hematocrit is > 52.  Last phlebotomy was 07/09/2016.  Symptomatically, he has dental issues.  Exam is stable.  He has decreased his smoking.  Hematocrit is 49.1.  Plan: 1.  Labs today:  CBC with diff, ferritin. 2.  No phlebotomy today. 3.  Encourage patient to stop smoking. 4.  Encourage patient to make an appointment at the [facility] for GI evaluation (EGD + colonoscopy). 5.  RTC in 2 months for labs (CBC) 6.  RTC in 6 months for MD assessment and labs (CBC with diff, ferritin) +/- phlebotomy.   Lequita Asal, MD  01/14/2017, 2:46 PM

## 2017-02-22 ENCOUNTER — Encounter: Payer: Self-pay | Admitting: Hematology and Oncology

## 2017-03-11 ENCOUNTER — Inpatient Hospital Stay: Payer: Medicare Other | Attending: Hematology and Oncology

## 2017-03-11 DIAGNOSIS — Z8673 Personal history of transient ischemic attack (TIA), and cerebral infarction without residual deficits: Secondary | ICD-10-CM | POA: Diagnosis not present

## 2017-03-11 DIAGNOSIS — Z79899 Other long term (current) drug therapy: Secondary | ICD-10-CM | POA: Diagnosis not present

## 2017-03-11 DIAGNOSIS — Z7982 Long term (current) use of aspirin: Secondary | ICD-10-CM | POA: Diagnosis not present

## 2017-03-11 DIAGNOSIS — I10 Essential (primary) hypertension: Secondary | ICD-10-CM | POA: Diagnosis not present

## 2017-03-11 DIAGNOSIS — R748 Abnormal levels of other serum enzymes: Secondary | ICD-10-CM | POA: Diagnosis not present

## 2017-03-11 DIAGNOSIS — D751 Secondary polycythemia: Secondary | ICD-10-CM | POA: Diagnosis not present

## 2017-03-11 LAB — CBC WITH DIFFERENTIAL/PLATELET
Basophils Absolute: 0 10*3/uL (ref 0–0.1)
Basophils Relative: 0 %
Eosinophils Absolute: 0 10*3/uL (ref 0–0.7)
Eosinophils Relative: 1 %
HCT: 46.5 % (ref 40.0–52.0)
Hemoglobin: 16 g/dL (ref 13.0–18.0)
Lymphocytes Relative: 33 %
Lymphs Abs: 1.7 10*3/uL (ref 1.0–3.6)
MCH: 30.4 pg (ref 26.0–34.0)
MCHC: 34.3 g/dL (ref 32.0–36.0)
MCV: 88.6 fL (ref 80.0–100.0)
Monocytes Absolute: 0.6 10*3/uL (ref 0.2–1.0)
Monocytes Relative: 12 %
Neutro Abs: 2.9 10*3/uL (ref 1.4–6.5)
Neutrophils Relative %: 54 %
Platelets: 158 10*3/uL (ref 150–440)
RBC: 5.24 MIL/uL (ref 4.40–5.90)
RDW: 13.8 % (ref 11.5–14.5)
WBC: 5.3 10*3/uL (ref 3.8–10.6)

## 2017-03-17 ENCOUNTER — Telehealth: Payer: Self-pay | Admitting: *Deleted

## 2017-03-17 NOTE — Telephone Encounter (Signed)
Attempted to leave voicemail for patient notifyng them that it is time to schedule annual low dose lung cancer screening CT scan. However there was not voicemail option available. Will attempt again in the future.

## 2017-03-23 ENCOUNTER — Telehealth: Payer: Self-pay | Admitting: *Deleted

## 2017-03-23 NOTE — Telephone Encounter (Signed)
  Please tell patient we talked and I agree with him setting up his low annual dose chest CT.  M

## 2017-03-23 NOTE — Telephone Encounter (Signed)
Ok thanks 

## 2017-03-23 NOTE — Telephone Encounter (Signed)
Notified patient that it is time to schedule annual low dose lung cancer screening CT scan. Reviewed rationale for scan, however, patient is reluctant to have lung screening scan until he sees and talks with his hematologist. Will forward note.

## 2017-04-12 ENCOUNTER — Telehealth: Payer: Self-pay | Admitting: *Deleted

## 2017-04-12 DIAGNOSIS — I679 Cerebrovascular disease, unspecified: Secondary | ICD-10-CM | POA: Diagnosis not present

## 2017-04-12 DIAGNOSIS — I10 Essential (primary) hypertension: Secondary | ICD-10-CM | POA: Diagnosis not present

## 2017-04-12 DIAGNOSIS — D45 Polycythemia vera: Secondary | ICD-10-CM | POA: Diagnosis not present

## 2017-04-12 DIAGNOSIS — M25511 Pain in right shoulder: Secondary | ICD-10-CM | POA: Diagnosis not present

## 2017-04-12 DIAGNOSIS — Z0001 Encounter for general adult medical examination with abnormal findings: Secondary | ICD-10-CM | POA: Diagnosis not present

## 2017-04-12 NOTE — Telephone Encounter (Signed)
Contacted patient to inform him that Dr. Mike Gip does want him to have the annual lung screening CT scan. Patient is adamant that he can not do it now and will talk about it with Dr. Mike Gip in July at his scheduled appt.

## 2017-05-05 DIAGNOSIS — E782 Mixed hyperlipidemia: Secondary | ICD-10-CM | POA: Diagnosis not present

## 2017-05-05 DIAGNOSIS — D45 Polycythemia vera: Secondary | ICD-10-CM | POA: Diagnosis not present

## 2017-05-05 DIAGNOSIS — Z0001 Encounter for general adult medical examination with abnormal findings: Secondary | ICD-10-CM | POA: Diagnosis not present

## 2017-05-05 DIAGNOSIS — R7301 Impaired fasting glucose: Secondary | ICD-10-CM | POA: Diagnosis not present

## 2017-07-15 ENCOUNTER — Inpatient Hospital Stay (HOSPITAL_BASED_OUTPATIENT_CLINIC_OR_DEPARTMENT_OTHER): Payer: Medicare Other | Admitting: Hematology and Oncology

## 2017-07-15 ENCOUNTER — Inpatient Hospital Stay: Payer: Medicare Other | Attending: Hematology and Oncology

## 2017-07-15 ENCOUNTER — Encounter: Payer: Self-pay | Admitting: Hematology and Oncology

## 2017-07-15 ENCOUNTER — Inpatient Hospital Stay: Payer: Medicare Other

## 2017-07-15 VITALS — BP 133/79 | HR 68 | Temp 97.7°F | Resp 18 | Wt 151.4 lb

## 2017-07-15 DIAGNOSIS — R748 Abnormal levels of other serum enzymes: Secondary | ICD-10-CM

## 2017-07-15 DIAGNOSIS — D751 Secondary polycythemia: Secondary | ICD-10-CM

## 2017-07-15 DIAGNOSIS — F17210 Nicotine dependence, cigarettes, uncomplicated: Secondary | ICD-10-CM | POA: Diagnosis not present

## 2017-07-15 DIAGNOSIS — I10 Essential (primary) hypertension: Secondary | ICD-10-CM | POA: Insufficient documentation

## 2017-07-15 DIAGNOSIS — Z7982 Long term (current) use of aspirin: Secondary | ICD-10-CM | POA: Diagnosis not present

## 2017-07-15 DIAGNOSIS — Z8673 Personal history of transient ischemic attack (TIA), and cerebral infarction without residual deficits: Secondary | ICD-10-CM | POA: Insufficient documentation

## 2017-07-15 DIAGNOSIS — Z79899 Other long term (current) drug therapy: Secondary | ICD-10-CM | POA: Insufficient documentation

## 2017-07-15 DIAGNOSIS — Z87891 Personal history of nicotine dependence: Secondary | ICD-10-CM

## 2017-07-15 LAB — CBC WITH DIFFERENTIAL/PLATELET
Basophils Absolute: 0.1 10*3/uL (ref 0–0.1)
Basophils Relative: 1 %
Eosinophils Absolute: 0 10*3/uL (ref 0–0.7)
Eosinophils Relative: 1 %
HCT: 47.5 % (ref 40.0–52.0)
Hemoglobin: 16.5 g/dL (ref 13.0–18.0)
Lymphocytes Relative: 38 %
Lymphs Abs: 1.6 10*3/uL (ref 1.0–3.6)
MCH: 30.8 pg (ref 26.0–34.0)
MCHC: 34.7 g/dL (ref 32.0–36.0)
MCV: 88.8 fL (ref 80.0–100.0)
Monocytes Absolute: 0.5 10*3/uL (ref 0.2–1.0)
Monocytes Relative: 11 %
Neutro Abs: 2.1 10*3/uL (ref 1.4–6.5)
Neutrophils Relative %: 49 %
Platelets: 150 10*3/uL (ref 150–440)
RBC: 5.35 MIL/uL (ref 4.40–5.90)
RDW: 14 % (ref 11.5–14.5)
WBC: 4.3 10*3/uL (ref 3.8–10.6)

## 2017-07-15 LAB — FERRITIN: Ferritin: 224 ng/mL (ref 24–336)

## 2017-07-15 NOTE — Progress Notes (Signed)
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  HPI:   The patient was last seen in the hematology clinic on 01/14/2017.  At that time, he denied any complaint.  He was smoking < 1 pack a day.  Hematocrit was 49.1.  Ferritin was 153.  He did not undergo phlebotomy.  Smoking cessation was encouraged.  I encouraged him to make an appointment at the [facility] for GI evaluation (EGD + colonoscopy).  We discussed the low dose chest CT program.  CBC on 03/12/2107 revealed a hematocrit of 46.5 and hemoglobin 16.0.  During the interim, he has felt "not too bad".  He is smoking 1 pack a day. He states he is "working on it" regarding smoking cessation. He states he needs to have some dental work.  This takes priority.  He has not contacted the [place] regarding GI evaluation as he is "trying to pay off other things".   Past Medical History:  Diagnosis Date  . Allergy   . Hypertension   . Personal history of tobacco use, presenting hazards to health 03/25/2016  . Stroke [facility])    2008 9/11    History reviewed. No pertinent surgical history.  Family History  Problem Relation Age of Onset  . Diabetes Mother   . Cancer Father        Throat   . HIV Brother     Social History:  reports that he has been smoking Cigarettes.  He has a 45.00 pack-year smoking history. He has never used smokeless tobacco. He reports that he drinks about 12.6 oz of alcohol per week . He reports that he does not use drugs.  He started smoking between the age of 12-18.  He smoked 1 pack a day for 48 years.  He is smoking 1 pack a day.  He drinks 2- 24 ounce beers/day.  He was in [employer] for 3 years.  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  He lives in [place].  The patient is alone today.  Allergies:  Allergies  Allergen Reactions  . Penicillins Rash    Current  Medications: Current Outpatient Prescriptions  Medication Sig Dispense Refill  . amLODipine (NORVASC) 10 MG tablet take 1 tablet by mouth once daily for blood pressure  0  . aspirin 81 MG tablet Take 81 mg by mouth daily.    . clopidogrel (PLAVIX) 75 MG tablet     . metoprolol succinate (TOPROL-XL) 25 MG 24 hr tablet take 1 tablet by mouth once daily for blood pressure  0   No current facility-administered medications for this visit.     Review of Systems:  GENERAL:  Feels "not too bad".  No fevers or sweats.  Weight down 10 pounds. PERFORMANCE STATUS (ECOG):  2 HEENT:  Gum disease (losing teeth).  Plans on getting all teeth extracted ([facility] not helping).  No visual changes, sore throat, mouth sores or tenderness. Lungs: No shortness of breath.  Morning cough.  No hemoptysis.  No sleep apnea. Cardiac:  No chest pain, palpitations, orthopnea, or PND. GI:  Teeth inhibit eating and causing weight loss.  No nausea, vomiting, diarrhea, constipation or melena.  h/o blood in stool.  No prior colonoscopy. GU:  No urgency, frequency, dysuria, or hematuria. Musculoskeletal:  No back pain.  Joint pain (knees and fingers).  No muscle tenderness. Extremities:  No pain or swelling. Skin:  No rashes or skin changes. Neuro:  h/o CVA.  No headache, numbness or weakness, balance or coordination issues. Endocrine:  No diabetes, thyroid issues, hot flashes or night sweats. Psych:  No mood changes, depression or anxiety. Pain:  No focal pain. Review of systems:  All other systems reviewed and found to be negative.  Physical Exam: Blood pressure 133/79, pulse 68, temperature 97.7 F (36.5 C), temperature source Tympanic, resp. rate 18, weight 151 lb 7 oz (68.7 kg). GENERAL:  Thin gentleman sitting comfortably in the exam room in no acute distress. MENTAL STATUS:  Alert and oriented to person, place and time. HEAD:  Wearing a black cap.  Gray hair and beard.  Normocephalic, atraumatic, face symmetric, no  Cushingoid features. EYES:  Glasses.  Brown eyes.  Bilateral arcus senilis.  Pupils equal round and reactive to light and accomodation. No conjunctivitis or scleral icterus. ENT: Oropharynx clear without lesion. Poor dentition. Tongue normal. Mucous membranes moist.  RESPIRATORY: Clear to auscultation without rales, wheezes or rhonchi. CARDIOVASCULAR: Regular rate and rhythm without murmur, rub or gallop. ABDOMEN: Soft, non-tender, with active bowel sounds, and no hepatosplenomegaly. No masses. SKIN: No rashes, ulcers or lesions. EXTREMITIES: No edema, no skin discoloration or tenderness. No palpable cords. LYMPH NODES: No palpable cervical, supraclavicular, axillary or inguinal adenopathy  NEUROLOGICAL: Decreased left nasolabial fold. PSYCH: Appropriate.   Appointment on 07/15/2017  Component Date Value Ref Range Status  . Ferritin 07/15/2017 224  24 - 336 ng/mL Final  . WBC 07/15/2017 4.3  3.8 - 10.6 K/uL Final  . RBC 07/15/2017 5.35  4.40 - 5.90 MIL/uL Final  . Hemoglobin 07/15/2017 16.5  13.0 - 18.0 g/dL Final  . HCT 07/15/2017 47.5  40.0 - 52.0 % Final  . MCV 07/15/2017 88.8  80.0 - 100.0 fL Final  . MCH 07/15/2017 30.8  26.0 - 34.0 pg Final  . MCHC 07/15/2017 34.7  32.0 - 36.0 g/dL Final  . RDW 07/15/2017 14.0  11.5 - 14.5 % Final  . Platelets 07/15/2017 150  150 - 440 K/uL Final  . Neutrophils Relative % 07/15/2017 49  % Final  . Neutro Abs 07/15/2017 2.1  1.4 - 6.5 K/uL Final  . Lymphocytes Relative 07/15/2017 38  % Final  . Lymphs Abs 07/15/2017 1.6  1.0 - 3.6 K/uL Final  . Monocytes Relative 07/15/2017 11  % Final  . Monocytes Absolute 07/15/2017 0.5  0.2 - 1.0 K/uL Final  . Eosinophils Relative 07/15/2017 1  % Final  . Eosinophils Absolute 07/15/2017 0.0  0 - 0.7 K/uL Final  . Basophils Relative 07/15/2017 1  % Final  . Basophils Absolute 07/15/2017 0.1  0 - 0.1 K/uL Final    Assessment:  Ian Conley is a 66 y.o. 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  He has a 48 pack year smoking history.  He denies any cardiac disease, sleep apnea, or testosterone use.  He had a CVA in 09/01/2007.  Work-up on 03/19/2016 revealed a hematocrit of 57.3, hemoglobin 19.5, MCV 91.5, platelets 144,000, and WBC 5000.  Erythropoietin level was 3.0 (2.6-18.5).  Repeat epo level was 6.8 on 05/05/2016.  JAK2 was negative for V617F and exon 12.  Ferritin was 181.  Iron studies revealed a saturation of 41% and a TIBC of 333.  Low dose spiral chest CT scan on 03/25/2016 was negative.  There was notation of a thickened area in the distal stomach or  pyloric region.  He has an elevated alkaline phosphatase (139).  PSA was 0.4.  He has never had a colonoscopy.  He undergoes small volume phlebotomy (250-300 cc) if his hematocrit is > 52.  Last phlebotomy was 07/09/2016.  Symptomatically, he has dental issues which are causing weight loss.  Exam is stable.  He is smoking.  Hematocrit is 47.5.  Plan: 1.  Labs today:  CBC with diff, ferritin. 2.  No phlebotomy today. 3.  Encourage patient to stop smoking. 4.  Strongly encourage patient to make an appointment at the [facility] for GI evaluation (EGD + colonoscopy). 5.  Patient amendable to low dose chest CT program f/u imaging.  Contact [employer]. 6.  RTC in 3 months for labs (hematocrit) 7.  RTC in 6 months for MD assessment and labs (CBC with diff, ferritin) +/- phlebotomy.   Lequita Asal, MD  07/15/2017, 10:54 AM

## 2017-07-15 NOTE — Progress Notes (Signed)
Patient offers no complaints today. 

## 2017-07-21 ENCOUNTER — Telehealth: Payer: Self-pay | Admitting: *Deleted

## 2017-07-21 DIAGNOSIS — Z87891 Personal history of nicotine dependence: Secondary | ICD-10-CM

## 2017-07-21 NOTE — Telephone Encounter (Signed)
After seeing medical oncology/hematology, patient is agreeable to have lung screening scan scheduled. Please see prior note for eligibility information.

## 2017-07-21 NOTE — Telephone Encounter (Signed)
Notified patient that annual lung cancer screening low dose CT scan is due currently or will be in near future. Confirmed that patient is within the age range of 55-77, and asymptomatic, (no signs or symptoms of lung cancer). Patient denies illness that would prevent curative treatment for lung cancer if found. Verified smoking history, (current, 46 pack year). The shared decision making visit was done 03/25/16. Patient is agreeable for CT scan being scheduled.

## 2017-07-29 ENCOUNTER — Ambulatory Visit
Admission: RE | Admit: 2017-07-29 | Discharge: 2017-07-29 | Disposition: A | Discharge status: Home / Self Care | Payer: Medicare Other | Source: Ambulatory Visit | Attending: Oncology | Admitting: Oncology

## 2017-07-29 DIAGNOSIS — J439 Emphysema, unspecified: Secondary | ICD-10-CM | POA: Diagnosis not present

## 2017-07-29 DIAGNOSIS — Z87891 Personal history of nicotine dependence: Secondary | ICD-10-CM

## 2017-07-29 DIAGNOSIS — I700 Atherosclerosis of aorta: Secondary | ICD-10-CM | POA: Insufficient documentation

## 2017-07-29 DIAGNOSIS — Z122 Encounter for screening for malignant neoplasm of respiratory organs: Secondary | ICD-10-CM | POA: Insufficient documentation

## 2017-08-02 ENCOUNTER — Encounter: Payer: Self-pay | Admitting: *Deleted

## 2017-10-11 DIAGNOSIS — L723 Sebaceous cyst: Secondary | ICD-10-CM | POA: Diagnosis not present

## 2017-10-11 DIAGNOSIS — I679 Cerebrovascular disease, unspecified: Secondary | ICD-10-CM | POA: Diagnosis not present

## 2017-10-11 DIAGNOSIS — I10 Essential (primary) hypertension: Secondary | ICD-10-CM | POA: Diagnosis not present

## 2017-10-11 DIAGNOSIS — D45 Polycythemia vera: Secondary | ICD-10-CM | POA: Diagnosis not present

## 2017-10-11 DIAGNOSIS — I8312 Varicose veins of left lower extremity with inflammation: Secondary | ICD-10-CM | POA: Diagnosis not present

## 2017-10-15 ENCOUNTER — Inpatient Hospital Stay: Payer: Medicare Other | Attending: Hematology and Oncology

## 2017-10-15 DIAGNOSIS — D751 Secondary polycythemia: Secondary | ICD-10-CM | POA: Diagnosis not present

## 2017-10-15 DIAGNOSIS — Z7982 Long term (current) use of aspirin: Secondary | ICD-10-CM | POA: Insufficient documentation

## 2017-10-15 DIAGNOSIS — Z8673 Personal history of transient ischemic attack (TIA), and cerebral infarction without residual deficits: Secondary | ICD-10-CM | POA: Insufficient documentation

## 2017-10-15 DIAGNOSIS — Z87891 Personal history of nicotine dependence: Secondary | ICD-10-CM

## 2017-10-15 DIAGNOSIS — I10 Essential (primary) hypertension: Secondary | ICD-10-CM | POA: Insufficient documentation

## 2017-10-15 DIAGNOSIS — R748 Abnormal levels of other serum enzymes: Secondary | ICD-10-CM | POA: Diagnosis not present

## 2017-10-15 DIAGNOSIS — Z79899 Other long term (current) drug therapy: Secondary | ICD-10-CM | POA: Insufficient documentation

## 2017-10-15 DIAGNOSIS — F17210 Nicotine dependence, cigarettes, uncomplicated: Secondary | ICD-10-CM | POA: Insufficient documentation

## 2017-10-15 LAB — HEMATOCRIT: HCT: 51.2 % (ref 40.0–52.0)

## 2018-01-04 DIAGNOSIS — M5136 Other intervertebral disc degeneration, lumbar region: Secondary | ICD-10-CM | POA: Diagnosis not present

## 2018-01-04 DIAGNOSIS — M5416 Radiculopathy, lumbar region: Secondary | ICD-10-CM | POA: Diagnosis not present

## 2018-01-17 ENCOUNTER — Encounter: Payer: Self-pay | Admitting: Hematology and Oncology

## 2018-01-17 ENCOUNTER — Other Ambulatory Visit: Payer: Self-pay

## 2018-01-17 ENCOUNTER — Telehealth: Payer: Self-pay | Admitting: *Deleted

## 2018-01-17 ENCOUNTER — Inpatient Hospital Stay: Payer: Medicare Other | Attending: Hematology and Oncology

## 2018-01-17 ENCOUNTER — Inpatient Hospital Stay: Payer: Medicare Other

## 2018-01-17 ENCOUNTER — Inpatient Hospital Stay (HOSPITAL_BASED_OUTPATIENT_CLINIC_OR_DEPARTMENT_OTHER): Payer: Medicare Other | Admitting: Hematology and Oncology

## 2018-01-17 VITALS — BP 158/79 | HR 74 | Temp 98.8°F | Wt 153.0 lb

## 2018-01-17 DIAGNOSIS — Z7982 Long term (current) use of aspirin: Secondary | ICD-10-CM | POA: Insufficient documentation

## 2018-01-17 DIAGNOSIS — R748 Abnormal levels of other serum enzymes: Secondary | ICD-10-CM | POA: Insufficient documentation

## 2018-01-17 DIAGNOSIS — Z79899 Other long term (current) drug therapy: Secondary | ICD-10-CM

## 2018-01-17 DIAGNOSIS — J439 Emphysema, unspecified: Secondary | ICD-10-CM | POA: Diagnosis not present

## 2018-01-17 DIAGNOSIS — F17210 Nicotine dependence, cigarettes, uncomplicated: Secondary | ICD-10-CM | POA: Insufficient documentation

## 2018-01-17 DIAGNOSIS — Z87891 Personal history of nicotine dependence: Secondary | ICD-10-CM

## 2018-01-17 DIAGNOSIS — D751 Secondary polycythemia: Secondary | ICD-10-CM | POA: Diagnosis not present

## 2018-01-17 DIAGNOSIS — I10 Essential (primary) hypertension: Secondary | ICD-10-CM | POA: Insufficient documentation

## 2018-01-17 DIAGNOSIS — Z8673 Personal history of transient ischemic attack (TIA), and cerebral infarction without residual deficits: Secondary | ICD-10-CM | POA: Diagnosis not present

## 2018-01-17 LAB — CBC WITH DIFFERENTIAL/PLATELET
Basophils Absolute: 0.1 10*3/uL (ref 0–0.1)
Basophils Relative: 1 %
Eosinophils Absolute: 0 10*3/uL (ref 0–0.7)
Eosinophils Relative: 0 %
HCT: 54.5 % — ABNORMAL HIGH (ref 40.0–52.0)
Hemoglobin: 18.3 g/dL — ABNORMAL HIGH (ref 13.0–18.0)
Lymphocytes Relative: 28 %
Lymphs Abs: 1.3 10*3/uL (ref 1.0–3.6)
MCH: 31.1 pg (ref 26.0–34.0)
MCHC: 33.6 g/dL (ref 32.0–36.0)
MCV: 92.5 fL (ref 80.0–100.0)
Monocytes Absolute: 0.4 10*3/uL (ref 0.2–1.0)
Monocytes Relative: 8 %
Neutro Abs: 2.9 10*3/uL (ref 1.4–6.5)
Neutrophils Relative %: 63 %
Platelets: 124 10*3/uL — ABNORMAL LOW (ref 150–440)
RBC: 5.9 MIL/uL (ref 4.40–5.90)
RDW: 14.1 % (ref 11.5–14.5)
WBC: 4.7 10*3/uL (ref 3.8–10.6)

## 2018-01-17 LAB — FERRITIN: Ferritin: 128 ng/mL (ref 24–336)

## 2018-01-17 NOTE — Progress Notes (Signed)
[place] [facility] day:  01/17/18   Chief Complaint: Ian Conley is a 67 y.o. 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  HPI:   The patient was last seen in the hematology clinic on 07/15/2017.  At that time, he had dental issues which were causing weight loss.  Exam was stable.  He was smoking.  Hematocrit was 47.5.  He did not require a phlebotomy.  Smoking cessation was encouraged.  We discussed GI evaluation at the [facility] for EGD and colonoscopy.  He was referred to the low dose chest CT program.  Chest CT on 07/29/2017 revealed Lung-RADS 2, benign appearance or behavior. Continue annual screening with low-dose chest CT without contrast in 12 months was recommended.  He has emphysema and aortic atherosclerois.  Hematocrit on 10/15/2017 was 51.2.  During the interim, patient is doing "just fine". He continues to have dental issues. He states, "That is my next project. I got some bills paid off. Now I am going to work on my teeth". Patient has not been evaluated by GI as recommended. He states, "I am doing one thing at a time. I will worry about that after I get my teeth fixed". His previously reported joint pain has resolved since he stopped the Aggrenox.   Patient continues to smoke on a daily basis. He is smoking about a pack a day. Patient denies chest pain and shortness of breath. He is eating well. His weight is up 2 pounds since his last visit. Patient denies pain in the clinic today.    Past Medical History:  Diagnosis Date  . Allergy   . Hypertension   . Personal history of tobacco use, presenting hazards to health 03/25/2016  . Stroke [facility])    2008 9/11    History reviewed. No pertinent surgical history.  Family History  Problem Relation Age of Onset  . Diabetes Mother   . Cancer Father        Throat   . HIV Brother     Social History:  reports that he has been smoking cigarettes.  He has a 45.00 pack-year  smoking history. he has never used smokeless tobacco. He reports that he drinks about 12.6 oz of alcohol per week. He reports that he does not use drugs.  He started smoking between the age of 25-18.  He smoked 1 pack a day for 48 years.  He is smoking 1 pack a day.  He drinks 2- 24 ounce beers/day.  He was in [employer] for 3 years.  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  He lives in [place].  The patient is alone today.  Allergies:  Allergies  Allergen Reactions  . Penicillins Rash    Current Medications: Current Outpatient Medications  Medication Sig Dispense Refill  . amLODipine (NORVASC) 10 MG tablet take 1 tablet by mouth once daily for blood pressure  0  . aspirin 81 MG tablet Take 81 mg by mouth daily.    . clopidogrel (PLAVIX) 75 MG tablet     . metoprolol succinate (TOPROL-XL) 25 MG 24 hr tablet take 1 tablet by mouth once daily for blood pressure  0   No current facility-administered medications for this visit.     Review of Systems:  GENERAL:  Feels "just fine".  No fevers or sweats.  Weight up 2 pounds. PERFORMANCE STATUS (ECOG):  2 HEENT:  Gum disease (losing  teeth).  Plans on getting all teeth extracted ([facility] not helping).  No visual changes, sore throat, mouth sores or tenderness. Lungs: No shortness of breath.  Morning cough.  No hemoptysis.  No sleep apnea. Cardiac:  No chest pain, palpitations, orthopnea, or PND. GI:  Teeth inhibit eating and causing weight loss.  No nausea, vomiting, diarrhea, constipation or melena.  h/o blood in stool.  No prior colonoscopy. GU:  No urgency, frequency, dysuria, or hematuria. Musculoskeletal:  No back pain.  Joint pain (knees and fingers).  No muscle tenderness. Extremities:  No pain or swelling. Skin:  No rashes or skin changes. Neuro:  h/o CVA.  No headache, numbness or weakness, balance or coordination issues. Endocrine:  No diabetes, thyroid issues, hot flashes or night sweats. Psych:  No mood changes,  depression or anxiety. Pain:  No focal pain. Review of systems:  All other systems reviewed and found to be negative.  Physical Exam: Blood pressure (!) 158/79, pulse 74, temperature 98.8 F (37.1 C), temperature source Tympanic, weight 153 lb (69.4 kg). GENERAL:  Thin gentleman sitting comfortably in the exam room in no acute distress. MENTAL STATUS:  Alert and oriented to person, place and time. HEAD:  Wearing a black cap.  Gray hair and scruffy beard.  Normocephalic, atraumatic, face symmetric, no Cushingoid features. EYES:  Glasses.  Brown eyes.  Bilateral arcus senilis.  Pupils equal round and reactive to light and accomodation. No conjunctivitis or scleral icterus. ENT: Oropharynx clear without lesion. Poor dentition. Tongue normal. Mucous membranes moist.  RESPIRATORY: Clear to auscultation without rales, wheezes or rhonchi. CARDIOVASCULAR: Regular rate and rhythm without murmur, rub or gallop. ABDOMEN: Soft, non-tender, with active bowel sounds, and no hepatosplenomegaly. No masses. SKIN: No rashes, ulcers or lesions. EXTREMITIES: No edema, no skin discoloration or tenderness. No palpable cords. LYMPH NODES: No palpable cervical, supraclavicular, axillary or inguinal adenopathy  NEUROLOGICAL: Decreased left nasolabial fold. PSYCH: Appropriate.   Appointment on 01/17/2018  Component Date Value Ref Range Status  . WBC 01/17/2018 4.7  3.8 - 10.6 K/uL Final  . RBC 01/17/2018 5.90  4.40 - 5.90 MIL/uL Final  . Hemoglobin 01/17/2018 18.3* 13.0 - 18.0 g/dL Final  . HCT 01/17/2018 54.5* 40.0 - 52.0 % Final  . MCV 01/17/2018 92.5  80.0 - 100.0 fL Final  . MCH 01/17/2018 31.1  26.0 - 34.0 pg Final  . MCHC 01/17/2018 33.6  32.0 - 36.0 g/dL Final  . RDW 01/17/2018 14.1  11.5 - 14.5 % Final  . Platelets 01/17/2018 124* 150 - 440 K/uL Final  . Neutrophils Relative % 01/17/2018 63  % Final  . Neutro Abs 01/17/2018 2.9  1.4 - 6.5 K/uL Final  . Lymphocytes Relative 01/17/2018 28   % Final  . Lymphs Abs 01/17/2018 1.3  1.0 - 3.6 K/uL Final  . Monocytes Relative 01/17/2018 8  % Final  . Monocytes Absolute 01/17/2018 0.4  0.2 - 1.0 K/uL Final  . Eosinophils Relative 01/17/2018 0  % Final  . Eosinophils Absolute 01/17/2018 0.0  0 - 0.7 K/uL Final  . Basophils Relative 01/17/2018 1  % Final  . Basophils Absolute 01/17/2018 0.1  0 - 0.1 K/uL Final   Performed at [facility], [address], [place], [place] [address]    Assessment:  NAJIR ROOP is a 67 y.o. 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  He has a 48 pack year smoking history.  He denies any cardiac disease, sleep apnea, or testosterone use.  He had a CVA in 09/01/2007.  Work-up on 03/19/2016 revealed a hematocrit of 57.3, hemoglobin 19.5, MCV 91.5, platelets 144,000, and WBC 5000.  Erythropoietin level was 3.0 (2.6-18.5).  Repeat epo level was 6.8 on 05/05/2016.  JAK2 was negative for V617F and exon 12.  Ferritin was 181.  Iron studies revealed a saturation of 41% and a TIBC of 333.  Low dose spiral chest CT scan on 03/25/2016 was negative.  There was notation of a thickened area in the distal stomach or pyloric region.  He has an elevated alkaline phosphatase (139).  PSA was 0.4.  He has never had a colonoscopy.  He undergoes small volume phlebotomy (250-300 cc) if his hematocrit is > 52.  Last phlebotomy was 07/09/2016.  Symptomatically, he has ongoing dental issues. He is able to eat.  Weight is up 2 pounds. Exam is stable.  He is smoking less.  Hematocrit is 54.5.  Plan: 1.  Labs today:  CBC with diff, ferritin. 2.  Discuss polycythemia. Hematocrit is 54.5. Goal for this patient is <52. Patient refusing small volume phlebotomy today.  3.  Encourage patient to stop smoking. 4.  Discuss need to follow up with dentist in order have needed dental work completed.  5.  Strongly encourage patient to make an appointment at the [facility] for GI evaluation (EGD + colonoscopy).   6.  RTC in 3  months for labs (hematocrit) and +/- phlebotomy 7.  RTC in 6 months for MD assessment and labs (CBC with diff, ferritin) +/- phlebotomy.   Honor Loh, NP  01/17/2018, 2:43 PM   I saw and evaluated the patient, participating in the key portions of the service and reviewing pertinent diagnostic studies and records.  I reviewed the nurse practitioner's note and agree with the findings and the plan.  The assessment and plan were discussed with the patient.  A few questions were asked by the patient and answered.   Nolon Stalls, MD 01/17/2018,2:43 PM

## 2018-01-17 NOTE — Telephone Encounter (Signed)
FYI.Marland KitchenMarland KitchenMarland KitchenMarland Kitchen 60 mins. Phlebotomy are scheduled in the afternoon @ 2:00  due to patients having longer Infusion times in the a.m. Patient was made aware of the scheduled time he Refused any afternoon appts to be scheduled.  Patient stated that he wanted all his appts to be scheduled in the morning  Because he wanted his afternoon time to himself. And walked out. No appt has been scheduled as of yet.

## 2018-01-17 NOTE — Progress Notes (Signed)
No phlebotomy today per Gaspar Bidding, NP

## 2018-01-18 ENCOUNTER — Telehealth: Payer: Self-pay | Admitting: *Deleted

## 2018-01-18 NOTE — Telephone Encounter (Signed)
After patient MD visit on 01/17/18 Patient refused to have his appt in the afternoon  So, he left without his appt schedule. Per Gaspar Bidding 01/17/18 staff message to ask patient  If he would be willing to have his procedure done In [place]. So, I contacted him he stated that he did  not want to go to [facility] and apologized for his actions the day before and agreed to come In the afternoon. So appts per 01/17/18 los was scheduled. He is aware of date and time of his appt. Also a reminder letter was mailed out.

## 2018-01-23 ENCOUNTER — Encounter: Payer: Self-pay | Admitting: Hematology and Oncology

## 2018-03-21 ENCOUNTER — Other Ambulatory Visit: Payer: Self-pay

## 2018-03-21 MED ORDER — AMLODIPINE BESYLATE 10 MG PO TABS: ORAL_TABLET | ORAL | 1 refills | Status: DC

## 2018-03-22 ENCOUNTER — Other Ambulatory Visit: Payer: Self-pay

## 2018-03-22 MED ORDER — AMLODIPINE BESYLATE 10 MG PO TABS: ORAL_TABLET | ORAL | 1 refills | Status: DC

## 2018-03-22 MED ORDER — METOPROLOL SUCCINATE ER 25 MG PO TB24: ORAL_TABLET | ORAL | 1 refills | Status: DC

## 2018-04-14 ENCOUNTER — Ambulatory Visit: Payer: Medicare Other | Admitting: Nurse Practitioner

## 2018-04-14 ENCOUNTER — Encounter: Payer: Self-pay | Admitting: Nurse Practitioner

## 2018-04-14 ENCOUNTER — Encounter: Payer: Self-pay | Admitting: *Deleted

## 2018-04-14 VITALS — BP 132/69 | HR 69 | Resp 16 | Ht 70.0 in | Wt 149.6 lb

## 2018-04-14 DIAGNOSIS — J069 Acute upper respiratory infection, unspecified: Secondary | ICD-10-CM | POA: Diagnosis not present

## 2018-04-14 DIAGNOSIS — I679 Cerebrovascular disease, unspecified: Secondary | ICD-10-CM | POA: Diagnosis not present

## 2018-04-14 DIAGNOSIS — R229 Localized swelling, mass and lump, unspecified: Secondary | ICD-10-CM

## 2018-04-14 DIAGNOSIS — Z125 Encounter for screening for malignant neoplasm of prostate: Secondary | ICD-10-CM

## 2018-04-14 DIAGNOSIS — M1611 Unilateral primary osteoarthritis, right hip: Secondary | ICD-10-CM

## 2018-04-14 DIAGNOSIS — I10 Essential (primary) hypertension: Secondary | ICD-10-CM

## 2018-04-14 MED ORDER — DOXYCYCLINE HYCLATE 100 MG PO TABS: 100.0000 mg | ORAL_TABLET | Freq: Two times a day (BID) | ORAL | 0 refills | Status: DC

## 2018-04-14 MED ORDER — CLOPIDOGREL BISULFATE 75 MG PO TABS: 75.0000 mg | ORAL_TABLET | Freq: Every day | ORAL | 5 refills | Status: DC

## 2018-04-14 NOTE — Progress Notes (Signed)
[facility] [place], [place] [address]  Internal MEDICINE  Office Visit Note  Patient Name: Ian Conley  [id number]  [id number]  Date of Service: 05/04/2018     Chief Complaint  Patient presents with  . Hip Pain    right hip bothering him. even when getting in and out of the ar, been going on for almost a month, not very intense wants to know what he can possibly take to relieve the pain  . Sinusitis    Hip Pain   The incident occurred more than 1 week ago. The incident occurred at home. There was no injury mechanism. The pain is present in the right hip. The quality of the pain is described as aching and cramping. The pain is at a severity of 4/10. The pain is moderate. The pain has been fluctuating since onset. Associated symptoms include an inability to bear weight and muscle weakness. He reports no foreign bodies present. The symptoms are aggravated by movement and weight bearing. He has tried acetaminophen for the symptoms. The treatment provided mild relief.  Sinusitis  This is a recurrent problem. The current episode started in the past 7 days. The problem is unchanged. There has been no fever. The pain is mild. Associated symptoms include congestion, coughing, headaches and sinus pressure. Pertinent negatives include no chills or shortness of breath. Past treatments include acetaminophen. The treatment provided mild relief.       Current Medication: Outpatient Encounter Medications as of 04/14/2018  Medication Sig Note  . amLODipine (NORVASC) 10 MG tablet Take 1 tab by po daily   . aspirin 81 MG tablet Take 81 mg by mouth daily.   . clopidogrel (PLAVIX) 75 MG tablet Take 1 tablet (75 mg total) by mouth daily.   . metoprolol succinate (TOPROL-XL) 25 MG 24 hr tablet take 1 tablet by mouth once daily for blood pressure   . [DISCONTINUED] clopidogrel (PLAVIX) 75 MG tablet  09/04/2016: Received from: External Pharmacy  . doxycycline (VIBRA-TABS) 100  MG tablet Take 1 tablet (100 mg total) by mouth 2 (two) times daily.    No facility-administered encounter medications on file as of 04/14/2018.     Surgical History: History reviewed. No pertinent surgical history.  Medical History: Past Medical History:  Diagnosis Date  . Allergy   . Hypertension   . Personal history of tobacco use, presenting hazards to health 03/25/2016  . Stroke [facility])    2008 9/11    Family History: Family History  Problem Relation Age of Onset  . Diabetes Mother   . Cancer Father        Throat   . HIV Brother     Social History   Socioeconomic History  . Marital status: Widowed    Spouse name: Not on file  . Number of children: Not on file  . Years of education: Not on file  . Highest education level: Not on file  Occupational History  . Not on file  Social Needs  . Financial resource strain: Not on file  . Food insecurity:    Worry: Not on file    Inability: Not on file  . Transportation needs:    Medical: Not on file    Non-medical: Not on file  Tobacco Use  . Smoking status: Current Every Day Smoker    Packs/day: 1.00    Years: 45.00    Pack years: 45.00    Types: Cigarettes  . Smokeless tobacco: Never Used  Substance and Sexual Activity  . Alcohol use: Yes    Alcohol/week: 12.6 oz    Types: 21 Cans of beer per week  . Drug use: No  . Sexual activity: Not on file  Lifestyle  . Physical activity:    Days per week: Not on file    Minutes per session: Not on file  . Stress: Not on file  Relationships  . Social connections:    Talks on phone: Not on file    Gets together: Not on file    Attends religious service: Not on file    Active member of club or organization: Not on file    Attends meetings of clubs or organizations: Not on file    Relationship status: Not on file  . Intimate partner violence:    Fear of current or ex partner: Not on file    Emotionally abused: Not on file    Physically abused: Not on file    Forced  sexual activity: Not on file  Other Topics Concern  . Not on file  Social History Narrative  . Not on file      Review of Systems  Constitutional: Positive for fatigue. Negative for activity change, appetite change, chills and fever.  HENT: Positive for congestion, facial swelling, postnasal drip, rhinorrhea, sinus pressure and sinus pain.   Eyes: Negative.   Respiratory: Positive for cough and wheezing. Negative for chest tightness and shortness of breath.   Cardiovascular: Negative for chest pain and palpitations.  Gastrointestinal: Negative for diarrhea, nausea and vomiting.  Endocrine: Negative for cold intolerance, heat intolerance, polydipsia, polyphagia and polyuria.  Musculoskeletal: Positive for arthralgias.       Pain in right hip especially when he applies weight to the foot. Also getting more difficult to flex the hip at the groin. Feels weak.   Allergic/Immunologic: Positive for environmental allergies.  Neurological: Positive for headaches. Negative for weakness.  Psychiatric/Behavioral: Negative for dysphoric mood. The patient is not nervous/anxious.     Today's Vitals   04/14/18 0955  BP: 132/69  Pulse: 69  Resp: 16  SpO2: 97%  Weight: 149 lb 9.6 oz (67.9 kg)  Height: 5\' 10"  (1.778 m)    Physical Exam  Constitutional: He is oriented to person, place, and time. He appears well-developed and well-nourished. No distress.  HENT:  Head: Normocephalic and atraumatic.    Right Ear: Ear canal normal.  Left Ear: Ear canal normal.  Nose: Rhinorrhea present. Right sinus exhibits frontal sinus tenderness. Left sinus exhibits frontal sinus tenderness.  Mouth/Throat: Posterior oropharyngeal erythema present. No oropharyngeal exudate.  Eyes: Pupils are equal, round, and reactive to light. EOM are normal.  Neck: Normal range of motion. Neck supple. No JVD present. No tracheal deviation present. No thyromegaly present.  Cardiovascular: Normal rate, regular rhythm and  normal heart sounds. Exam reveals no gallop and no friction rub.  No murmur heard. Mildly irregular heart rhythm.   Pulmonary/Chest: Effort normal and breath sounds normal. No respiratory distress. He has no wheezes. He has no rales. He exhibits no tenderness.  Abdominal: Soft. Bowel sounds are normal. There is no tenderness.  Musculoskeletal:  Right hip tenderness more severe in groin area. No palpable abnormalities or deformities present. ROM and strength are slightly diminished, especially with flexion of hip.   Lymphadenopathy:    He has no cervical adenopathy.  Neurological: He is alert and oriented to person, place, and time. He displays normal reflexes. No cranial nerve deficit.  Skin: Skin is  warm and dry. He is not diaphoretic.  Psychiatric: He has a normal mood and affect. His behavior is normal. Judgment and thought content normal.  Nursing note and vitals reviewed.  Assessment/Plan: 1. Essential hypertension Stable. Continue bp medication as prescribed. Routine, fasting labs ordered today.  - Comprehensive metabolic panel - Lipid panel - TSH  2. Acute upper respiratory infection Treat with doxycycline 100mg  twice daiy for 10 days. Recommend OTC medication to alleviate symptoms.  - doxycycline (VIBRA-TABS) 100 MG tablet; Take 1 tablet (100 mg total) by mouth 2 (two) times daily.  Dispense: 20 tablet; Refill: 0  3. Primary osteoarthritis of right hip Recommend tylenol as needed for pain. Check connective tissue panel for further evaluation.  - ANA w/Reflex if Positive - Rheumatoid factor - Sedimentation rate - Uric acid  4. Cerebrovascular disease - clopidogrel (PLAVIX) 75 MG tablet; Take 1 tablet (75 mg total) by mouth daily.  Dispense: 30 tablet; Refill: 5  5. Localized superficial swelling, mass, or lump Cyst-like lesion on right cheek. Refer to surgery for evaluation and possible removal.  - Ambulatory referral to General Surgery  6. Screening for prostate  cancer - PSA  General Counseling: Marcellino verbalizes understanding of the findings of todays visit and agrees with plan of treatment. I have discussed any further diagnostic evaluation that may be needed or ordered today. We also reviewed his medications today. he has been encouraged to call the office with any questions or concerns that should arise related to todays visit.  Rest and increase fluids. Continue using OTC medication to control symptoms.   Hypertension Counseling:   The following hypertensive lifestyle modification were recommended and discussed:  1. Limiting alcohol intake to less than 1 oz/day of ethanol:(24 oz of beer or 8 oz of wine or 2 oz of 100-proof whiskey). 2. Take baby ASA 81 mg daily. 3. Importance of regular aerobic exercise and losing weight. 4. Reduce dietary saturated fat and cholesterol intake for overall cardiovascular health. 5. Maintaining adequate dietary potassium, calcium, and magnesium intake. 6. Regular monitoring of the blood pressure. 7. Reduce sodium intake to less than 100 mmol/day (less than 2.3 gm of sodium or less than 6 gm of sodium choride)   This patient was seen by Leretha Pol, FNP- C in Collaboration with Dr Lavera Guise as a part of collaborative care agreement    Orders Placed This Encounter  Procedures  . ANA w/Reflex if Positive  . Rheumatoid factor  . Sedimentation rate  . Uric acid  . PSA  . Comprehensive metabolic panel  . Lipid panel  . TSH  . Ambulatory referral to General Surgery    Meds ordered this encounter  Medications  . doxycycline (VIBRA-TABS) 100 MG tablet    Sig: Take 1 tablet (100 mg total) by mouth 2 (two) times daily.    Dispense:  20 tablet    Refill:  0    Order Specific Question:   Supervising Provider    Answer:   Lavera Guise [[id number]]  . clopidogrel (PLAVIX) 75 MG tablet    Sig: Take 1 tablet (75 mg total) by mouth daily.    Dispense:  30 tablet    Refill:  5    Order Specific Question:    Supervising Provider    Answer:   Lavera Guise [[id number]]    Time spent: 71 Minutes     Dr Lavera Guise Internal medicine

## 2018-04-18 ENCOUNTER — Inpatient Hospital Stay: Payer: Medicare Other | Attending: Hematology and Oncology

## 2018-04-18 ENCOUNTER — Inpatient Hospital Stay: Payer: Medicare Other

## 2018-04-18 ENCOUNTER — Other Ambulatory Visit: Payer: Self-pay | Admitting: Urgent Care

## 2018-04-18 DIAGNOSIS — J439 Emphysema, unspecified: Secondary | ICD-10-CM | POA: Diagnosis not present

## 2018-04-18 DIAGNOSIS — D751 Secondary polycythemia: Secondary | ICD-10-CM

## 2018-04-18 DIAGNOSIS — I10 Essential (primary) hypertension: Secondary | ICD-10-CM | POA: Insufficient documentation

## 2018-04-18 DIAGNOSIS — Z8673 Personal history of transient ischemic attack (TIA), and cerebral infarction without residual deficits: Secondary | ICD-10-CM | POA: Diagnosis not present

## 2018-04-18 DIAGNOSIS — F17210 Nicotine dependence, cigarettes, uncomplicated: Secondary | ICD-10-CM | POA: Diagnosis not present

## 2018-04-18 DIAGNOSIS — Z79899 Other long term (current) drug therapy: Secondary | ICD-10-CM | POA: Insufficient documentation

## 2018-04-18 DIAGNOSIS — R748 Abnormal levels of other serum enzymes: Secondary | ICD-10-CM | POA: Diagnosis not present

## 2018-04-18 DIAGNOSIS — Z7982 Long term (current) use of aspirin: Secondary | ICD-10-CM | POA: Insufficient documentation

## 2018-04-18 LAB — CBC
HEMATOCRIT: 56.5 % — AB (ref 40.0–52.0)
Hemoglobin: 19.3 g/dL — ABNORMAL HIGH (ref 13.0–18.0)
MCH: 32.1 pg (ref 26.0–34.0)
MCHC: 34.2 g/dL (ref 32.0–36.0)
MCV: 93.7 fL (ref 80.0–100.0)
PLATELETS: 118 10*3/uL — AB (ref 150–440)
RBC: 6.03 MIL/uL — ABNORMAL HIGH (ref 4.40–5.90)
RDW: 14.6 % — AB (ref 11.5–14.5)
WBC: 3.8 10*3/uL (ref 3.8–10.6)

## 2018-05-04 DIAGNOSIS — I10 Essential (primary) hypertension: Secondary | ICD-10-CM | POA: Insufficient documentation

## 2018-05-04 DIAGNOSIS — M1611 Unilateral primary osteoarthritis, right hip: Secondary | ICD-10-CM | POA: Insufficient documentation

## 2018-05-04 DIAGNOSIS — Z0001 Encounter for general adult medical examination with abnormal findings: Secondary | ICD-10-CM | POA: Insufficient documentation

## 2018-05-04 DIAGNOSIS — I679 Cerebrovascular disease, unspecified: Secondary | ICD-10-CM | POA: Insufficient documentation

## 2018-05-04 DIAGNOSIS — R229 Localized swelling, mass and lump, unspecified: Secondary | ICD-10-CM | POA: Insufficient documentation

## 2018-05-04 DIAGNOSIS — J069 Acute upper respiratory infection, unspecified: Secondary | ICD-10-CM | POA: Insufficient documentation

## 2018-05-12 ENCOUNTER — Encounter: Payer: Self-pay | Admitting: *Deleted

## 2018-05-17 ENCOUNTER — Ambulatory Visit: Payer: Medicare Other | Admitting: General Surgery

## 2018-05-17 ENCOUNTER — Encounter: Payer: Self-pay | Admitting: General Surgery

## 2018-05-17 VITALS — BP 140/82 | HR 70 | Ht 70.0 in | Wt 144.0 lb

## 2018-05-17 DIAGNOSIS — L729 Follicular cyst of the skin and subcutaneous tissue, unspecified: Secondary | ICD-10-CM

## 2018-05-17 DIAGNOSIS — L723 Sebaceous cyst: Secondary | ICD-10-CM

## 2018-05-17 DIAGNOSIS — L720 Epidermal cyst: Secondary | ICD-10-CM | POA: Diagnosis not present

## 2018-05-17 NOTE — Patient Instructions (Addendum)
The patient is aware to call back for any questions or concerns.   1 week suture removal  May shower starting tomorrow May use an Ice pack as needed for comfort

## 2018-05-17 NOTE — Progress Notes (Signed)
Patient ID: Ian Conley, male   DOB: [birth year], 67 y.o.   MRN: [id number]  Chief Complaint  Patient presents with  . Cyst    HPI Ian Conley is a 67 y.o. male.  Here for evaluation of a cyst on his check referred by Leretha Pol NP. He states it has been there for one year. He has been letting the barbor shave him and he thinks the clipers aren't cleaned good. Denies pain. He states it is the same size. History of a stroke with weakness on the left side, he is currently on Plavix.   HPI  Past Medical History:  Diagnosis Date  . Allergy   . Hypertension   . Personal history of tobacco use, presenting hazards to health 03/25/2016  . Polycythemia   . Stroke [facility])    2008 9/11    No past surgical history on file.  Family History  Problem Relation Age of Onset  . Diabetes Mother   . Cancer Father        Throat   . HIV Brother     Social History Social History   Tobacco Use  . Smoking status: Current Every Day Smoker    Packs/day: 1.00    Years: 45.00    Pack years: 45.00    Types: Cigarettes  . Smokeless tobacco: Never Used  Substance Use Topics  . Alcohol use: Yes    Alcohol/week: 12.6 oz    Types: 21 Cans of beer per week  . Drug use: No    Allergies  Allergen Reactions  . Penicillins Rash    Current Outpatient Medications  Medication Sig Dispense Refill  . amLODipine (NORVASC) 10 MG tablet Take 1 tab by po daily 90 tablet 1  . aspirin 81 MG tablet Take 81 mg by mouth daily.    . clopidogrel (PLAVIX) 75 MG tablet Take 1 tablet (75 mg total) by mouth daily. 30 tablet 5  . metoprolol succinate (TOPROL-XL) 25 MG 24 hr tablet take 1 tablet by mouth once daily for blood pressure 90 tablet 1  . phenylephrine (SUDAFED PE) 10 MG TABS tablet Take 10 mg by mouth every 6 (six) hours as needed.    . traMADol (ULTRAM) 50 MG tablet Take 50 mg by mouth every 6 (six) hours as needed.     No current facility-administered medications for this visit.     Review of  Systems Review of Systems  Constitutional: Negative.   Respiratory: Negative.   Cardiovascular: Negative.     Blood pressure 140/82, pulse 70, height 5\' 10"  (1.778 m), weight 144 lb (65.3 kg).  Physical Exam Physical Exam  Constitutional: He is oriented to person, place, and time. He appears well-developed and well-nourished.  HENT:  Head:    Mouth/Throat: No oropharyngeal exudate.  Eyes: Conjunctivae are normal. No scleral icterus.  Neck: Normal range of motion. Neck supple.  Lymphadenopathy:    He has no cervical adenopathy.  Neurological: He is alert and oriented to person, place, and time.  Skin: Skin is warm and dry.  2 cm cyst left check   Psychiatric: His behavior is normal.    Data Reviewed The procedure for excision was reviewed and the patient was desirous to proceed.  The area was cleansed with ChloraPrep followed by 10 cc of 0.5% Xylocaine with 0.25% Marcaine with 1 to 200,000 units of epinephrine.  A radial incision was made over the lesion and the skin incised sharply.  The remaining dissection was completed with  scissor dissection.  The sebaceous cyst was extracted without difficulty.  No significant bleeding.  The area was cleansed with peroxide and the defect closed with interrupted 5-0 Prolene sutures.  Attempts were made to obliterate the dead space during closure.  Patient tolerated the procedure well.  Assessment    Sebaceous cyst of the left face.    Plan    Patient has been encouraged to use ice for comfort.  Tylenol if needed for soreness.   1 week suture removal   HPI, Physical Exam, Assessment and Plan have been scribed under the direction and in the presence of Robert Bellow, MD. Karie Fetch, RN     Ian Conley 05/17/2018, 3:43 PM

## 2018-05-24 ENCOUNTER — Ambulatory Visit (INDEPENDENT_AMBULATORY_CARE_PROVIDER_SITE_OTHER): Payer: Medicare Other | Admitting: *Deleted

## 2018-05-24 ENCOUNTER — Telehealth: Payer: Self-pay | Admitting: *Deleted

## 2018-05-24 DIAGNOSIS — L723 Sebaceous cyst: Secondary | ICD-10-CM

## 2018-05-24 NOTE — Progress Notes (Signed)
Patient ID: Ian Conley, male   DOB: 09-16-1951, 67 y.o.   MRN: [id number] Patient came in today for a wound check.  The wound is clean, with no signs of infection noted. The sutures were removed  Follow up as scheduled.

## 2018-05-24 NOTE — Telephone Encounter (Signed)
-----   Message from Robert Bellow, MD sent at 05/23/2018 10:12 PM EDT ----- Please notify the patient that the laboratory report on the skin cyst was fine.  return as planned for wound check. ----- Message ----- From: Interface, Lab In [place] Sent: 05/19/2018   6:49 PM To: Robert Bellow, MD

## 2018-05-24 NOTE — Telephone Encounter (Signed)
Notified patient as instructed, patient agrees. Patient aware to come in today 05/24/18 for wound check

## 2018-05-24 NOTE — Patient Instructions (Signed)
Return as needed

## 2018-07-13 ENCOUNTER — Emergency Department: Payer: Medicare Other

## 2018-07-13 ENCOUNTER — Other Ambulatory Visit: Payer: Self-pay

## 2018-07-13 ENCOUNTER — Inpatient Hospital Stay
Admission: EM | Admit: 2018-07-13 | Discharge: 2018-07-16 | DRG: 064 | Disposition: A | Discharge status: Skilled Nursing Facility | Payer: Medicare Other | Attending: Specialist | Admitting: Specialist

## 2018-07-13 DIAGNOSIS — R4182 Altered mental status, unspecified: Secondary | ICD-10-CM | POA: Diagnosis present

## 2018-07-13 DIAGNOSIS — N179 Acute kidney failure, unspecified: Secondary | ICD-10-CM | POA: Diagnosis present

## 2018-07-13 DIAGNOSIS — E43 Unspecified severe protein-calorie malnutrition: Secondary | ICD-10-CM

## 2018-07-13 DIAGNOSIS — R410 Disorientation, unspecified: Secondary | ICD-10-CM | POA: Diagnosis not present

## 2018-07-13 DIAGNOSIS — E871 Hypo-osmolality and hyponatremia: Secondary | ICD-10-CM | POA: Diagnosis not present

## 2018-07-13 DIAGNOSIS — J984 Other disorders of lung: Secondary | ICD-10-CM | POA: Diagnosis not present

## 2018-07-13 DIAGNOSIS — I639 Cerebral infarction, unspecified: Principal | ICD-10-CM

## 2018-07-13 DIAGNOSIS — B951 Streptococcus, group B, as the cause of diseases classified elsewhere: Secondary | ICD-10-CM | POA: Diagnosis present

## 2018-07-13 DIAGNOSIS — F17210 Nicotine dependence, cigarettes, uncomplicated: Secondary | ICD-10-CM | POA: Diagnosis present

## 2018-07-13 DIAGNOSIS — Z79899 Other long term (current) drug therapy: Secondary | ICD-10-CM | POA: Diagnosis not present

## 2018-07-13 DIAGNOSIS — Z7982 Long term (current) use of aspirin: Secondary | ICD-10-CM | POA: Diagnosis not present

## 2018-07-13 DIAGNOSIS — I6901 Attention and concentration deficit following nontraumatic subarachnoid hemorrhage: Secondary | ICD-10-CM | POA: Diagnosis not present

## 2018-07-13 DIAGNOSIS — Z7902 Long term (current) use of antithrombotics/antiplatelets: Secondary | ICD-10-CM | POA: Diagnosis not present

## 2018-07-13 DIAGNOSIS — R1312 Dysphagia, oropharyngeal phase: Secondary | ICD-10-CM | POA: Diagnosis not present

## 2018-07-13 DIAGNOSIS — Z880 Allergy status to penicillin: Secondary | ICD-10-CM | POA: Diagnosis not present

## 2018-07-13 DIAGNOSIS — M6281 Muscle weakness (generalized): Secondary | ICD-10-CM | POA: Diagnosis not present

## 2018-07-13 DIAGNOSIS — G459 Transient cerebral ischemic attack, unspecified: Secondary | ICD-10-CM | POA: Diagnosis not present

## 2018-07-13 DIAGNOSIS — N390 Urinary tract infection, site not specified: Secondary | ICD-10-CM | POA: Diagnosis not present

## 2018-07-13 DIAGNOSIS — R627 Adult failure to thrive: Secondary | ICD-10-CM | POA: Diagnosis not present

## 2018-07-13 DIAGNOSIS — Z8673 Personal history of transient ischemic attack (TIA), and cerebral infarction without residual deficits: Secondary | ICD-10-CM

## 2018-07-13 DIAGNOSIS — I959 Hypotension, unspecified: Secondary | ICD-10-CM | POA: Diagnosis present

## 2018-07-13 DIAGNOSIS — I10 Essential (primary) hypertension: Secondary | ICD-10-CM | POA: Diagnosis present

## 2018-07-13 DIAGNOSIS — D751 Secondary polycythemia: Secondary | ICD-10-CM | POA: Diagnosis present

## 2018-07-13 DIAGNOSIS — Z7401 Bed confinement status: Secondary | ICD-10-CM | POA: Diagnosis not present

## 2018-07-13 DIAGNOSIS — I63233 Cerebral infarction due to unspecified occlusion or stenosis of bilateral carotid arteries: Secondary | ICD-10-CM | POA: Diagnosis not present

## 2018-07-13 DIAGNOSIS — Z681 Body mass index (BMI) 19 or less, adult: Secondary | ICD-10-CM

## 2018-07-13 DIAGNOSIS — F1010 Alcohol abuse, uncomplicated: Secondary | ICD-10-CM | POA: Diagnosis present

## 2018-07-13 DIAGNOSIS — I6389 Other cerebral infarction: Secondary | ICD-10-CM | POA: Diagnosis not present

## 2018-07-13 DIAGNOSIS — E785 Hyperlipidemia, unspecified: Secondary | ICD-10-CM | POA: Diagnosis not present

## 2018-07-13 DIAGNOSIS — E860 Dehydration: Secondary | ICD-10-CM | POA: Diagnosis present

## 2018-07-13 DIAGNOSIS — G92 Toxic encephalopathy: Secondary | ICD-10-CM | POA: Diagnosis not present

## 2018-07-13 DIAGNOSIS — D696 Thrombocytopenia, unspecified: Secondary | ICD-10-CM | POA: Diagnosis not present

## 2018-07-13 DIAGNOSIS — G9340 Encephalopathy, unspecified: Secondary | ICD-10-CM | POA: Diagnosis not present

## 2018-07-13 DIAGNOSIS — M1611 Unilateral primary osteoarthritis, right hip: Secondary | ICD-10-CM | POA: Diagnosis not present

## 2018-07-13 DIAGNOSIS — I69054 Hemiplegia and hemiparesis following nontraumatic subarachnoid hemorrhage affecting left non-dominant side: Secondary | ICD-10-CM | POA: Diagnosis not present

## 2018-07-13 LAB — COMPREHENSIVE METABOLIC PANEL
ALBUMIN: 2.9 g/dL — AB (ref 3.5–5.0)
ALT: 31 U/L (ref 0–44)
AST: 38 U/L (ref 15–41)
Alkaline Phosphatase: 81 U/L (ref 38–126)
Anion gap: 10 (ref 5–15)
BUN: 23 mg/dL (ref 8–23)
CHLORIDE: 101 mmol/L (ref 98–111)
CO2: 19 mmol/L — AB (ref 22–32)
Calcium: 8.8 mg/dL — ABNORMAL LOW (ref 8.9–10.3)
Creatinine, Ser: 1.38 mg/dL — ABNORMAL HIGH (ref 0.61–1.24)
GFR calc Af Amer: >60 mL/min (ref >60)
GFR calc non Af Amer: 52 mL/min — ABNORMAL LOW (ref >60)
GLUCOSE: 139 mg/dL — AB (ref 70–99)
POTASSIUM: 4 mmol/L (ref 3.5–5.1)
SODIUM: 130 mmol/L — AB (ref 135–145)
Total Bilirubin: 1.1 mg/dL (ref 0.3–1.2)
Total Protein: 6.8 g/dL (ref 6.5–8.1)

## 2018-07-13 LAB — URINALYSIS, COMPLETE (UACMP) WITH MICROSCOPIC
BACTERIA UA: NONE SEEN
BILIRUBIN URINE: NEGATIVE
GLUCOSE, UA: NEGATIVE mg/dL
Ketones, ur: 5 mg/dL — AB
LEUKOCYTES UA: NEGATIVE
Nitrite: NEGATIVE
PROTEIN: NEGATIVE mg/dL
Specific Gravity, Urine: 1.015 (ref 1.005–1.030)
pH: 5 (ref 5.0–8.0)

## 2018-07-13 LAB — TROPONIN I

## 2018-07-13 LAB — CBC
HCT: 48.5 % (ref 40.0–52.0)
Hemoglobin: 16.4 g/dL (ref 13.0–18.0)
MCH: 30.1 pg (ref 26.0–34.0)
MCHC: 33.9 g/dL (ref 32.0–36.0)
MCV: 88.8 fL (ref 80.0–100.0)
PLATELETS: 77 10*3/uL — AB (ref 150–440)
RBC: 5.45 MIL/uL (ref 4.40–5.90)
RDW: 13.6 % (ref 11.5–14.5)
WBC: 4.2 10*3/uL (ref 3.8–10.6)

## 2018-07-13 LAB — AMMONIA: AMMONIA: 11 umol/L (ref 9–35)

## 2018-07-13 LAB — LACTIC ACID, PLASMA
LACTIC ACID, VENOUS: 2.3 mmol/L — AB (ref 0.5–1.9)
Lactic Acid, Venous: 2 mmol/L (ref 0.5–1.9)

## 2018-07-13 MED ORDER — LORAZEPAM 2 MG/ML IJ SOLN: 1.0000 mg | Freq: Four times a day (QID) | INTRAMUSCULAR | Status: AC | PRN

## 2018-07-13 MED ORDER — THIAMINE HCL 100 MG/ML IJ SOLN: 100.0000 mg | Freq: Every day | INTRAMUSCULAR | Status: DC

## 2018-07-13 MED ORDER — ADULT MULTIVITAMIN W/MINERALS CH: 1.0000 | ORAL_TABLET | Freq: Every day | ORAL | Status: DC

## 2018-07-13 MED ORDER — ASPIRIN EC 81 MG PO TBEC
81.0000 mg | DELAYED_RELEASE_TABLET | Freq: Every day | ORAL | Status: DC
  Administered 2018-07-13 – 2018-07-14 (×2): 81 mg via ORAL
  Filled 2018-07-13 (×2): qty 1

## 2018-07-13 MED ORDER — ENOXAPARIN SODIUM 40 MG/0.4ML ~~LOC~~ SOLN
40.0000 mg | SUBCUTANEOUS | Status: DC
  Administered 2018-07-13 – 2018-07-15 (×3): 40 mg via SUBCUTANEOUS
  Filled 2018-07-13 (×3): qty 0.4

## 2018-07-13 MED ORDER — LORAZEPAM 2 MG PO TABS
0.0000 mg | ORAL_TABLET | Freq: Four times a day (QID) | ORAL | Status: DC
  Administered 2018-07-13: 3 mg via ORAL
  Filled 2018-07-13: qty 1

## 2018-07-13 MED ORDER — ADULT MULTIVITAMIN W/MINERALS CH
1.0000 | ORAL_TABLET | Freq: Every day | ORAL | Status: DC
  Administered 2018-07-13 – 2018-07-14 (×2): 1 via ORAL
  Filled 2018-07-13 (×2): qty 1

## 2018-07-13 MED ORDER — LORAZEPAM 2 MG PO TABS: 0.0000 mg | ORAL_TABLET | Freq: Two times a day (BID) | ORAL | Status: DC

## 2018-07-13 MED ORDER — VITAMIN B-1 100 MG PO TABS
100.0000 mg | ORAL_TABLET | Freq: Every day | ORAL | Status: DC
  Administered 2018-07-13 – 2018-07-14 (×2): 100 mg via ORAL
  Filled 2018-07-13 (×2): qty 1

## 2018-07-13 MED ORDER — SODIUM CHLORIDE 0.9 % IV SOLN
INTRAVENOUS | Status: DC
  Administered 2018-07-13 – 2018-07-14 (×2): via INTRAVENOUS

## 2018-07-13 MED ORDER — FOLIC ACID 1 MG PO TABS
1.0000 mg | ORAL_TABLET | Freq: Every day | ORAL | Status: DC
  Administered 2018-07-13 – 2018-07-14 (×2): 1 mg via ORAL
  Filled 2018-07-13 (×2): qty 1

## 2018-07-13 MED ORDER — ACETAMINOPHEN 650 MG RE SUPP: 650.0000 mg | Freq: Four times a day (QID) | RECTAL | Status: DC | PRN

## 2018-07-13 MED ORDER — POLYETHYLENE GLYCOL 3350 17 G PO PACK: 17.0000 g | PACK | Freq: Every day | ORAL | Status: DC | PRN

## 2018-07-13 MED ORDER — ACETAMINOPHEN 325 MG PO TABS
650.0000 mg | ORAL_TABLET | Freq: Four times a day (QID) | ORAL | Status: DC | PRN
  Administered 2018-07-15: 650 mg via ORAL
  Filled 2018-07-13: qty 2

## 2018-07-13 MED ORDER — LORAZEPAM 1 MG PO TABS
1.0000 mg | ORAL_TABLET | Freq: Four times a day (QID) | ORAL | Status: AC | PRN
  Filled 2018-07-13: qty 1

## 2018-07-13 MED ORDER — CLOPIDOGREL BISULFATE 75 MG PO TABS
75.0000 mg | ORAL_TABLET | Freq: Every day | ORAL | Status: DC
  Administered 2018-07-13 – 2018-07-14 (×2): 75 mg via ORAL
  Filled 2018-07-13 (×2): qty 1

## 2018-07-13 MED ORDER — VITAMIN B-1 100 MG PO TABS: 100.0000 mg | ORAL_TABLET | Freq: Every day | ORAL | Status: DC

## 2018-07-13 MED ORDER — FOLIC ACID 1 MG PO TABS: 1.0000 mg | ORAL_TABLET | Freq: Every day | ORAL | Status: DC

## 2018-07-13 NOTE — ED Triage Notes (Signed)
Pt arrived via ambulance family c/o failure to thrive x 3-4 days states that patient is more confused and agitated

## 2018-07-13 NOTE — ED Provider Notes (Signed)
[facility] Emergency Department Provider Note  Time seen: 8:54 AM  I have reviewed the triage vital signs and the nursing notes.   HISTORY  Chief Complaint Altered Mental Status    HPI Ian Conley is a 68 y.o. male with a past medical history of hypertension, CVA, presents to the emergency department for altered mental status.  According to family patient lives at home for the past 3 days he has been very confused.  Denies any history of the same.  Denies any alcohol use.  No known recent falls.  They deny any known fever vomiting or diarrhea.  Here patient is awake and alert, but he is confused.  Does not know why he is here says he is here for his scheduled appointment.  Family states this is extremely abnormal for him to be acting this way.   Past Medical History:  Diagnosis Date  . Allergy   . Hypertension   . Personal history of tobacco use, presenting hazards to health 03/25/2016  . Polycythemia   . Stroke [facility])    2008 9/11    Patient Active Problem List   Diagnosis Date Noted  . Sebaceous cyst 05/17/2018  . Essential hypertension 05/04/2018  . Acute upper respiratory infection 05/04/2018  . Primary osteoarthritis of right hip 05/04/2018  . Cerebrovascular disease 05/04/2018  . Localized superficial swelling, mass, or lump 05/04/2018  . Screening for prostate cancer 05/04/2018  . Polycythemia, secondary 09/05/2016  . Personal history of tobacco use, presenting hazards to health 03/25/2016  . Gastric wall thickening 03/25/2016  . Alkaline phosphatase elevation 03/10/2016    No past surgical history on file.  Prior to Admission medications   Medication Sig Start Date End Date Taking? Authorizing Provider  amLODipine (NORVASC) 10 MG tablet Take 1 tab by po daily 03/22/18   Ronnell Freshwater, NP  aspirin 81 MG tablet Take 81 mg by mouth daily.    [provider]  clopidogrel (PLAVIX) 75 MG tablet Take 1 tablet (75 mg total) by mouth  daily. 04/14/18   Ronnell Freshwater, NP  metoprolol succinate (TOPROL-XL) 25 MG 24 hr tablet take 1 tablet by mouth once daily for blood pressure 03/22/18   Boscia, Heather E, NP  phenylephrine (SUDAFED PE) 10 MG TABS tablet Take 10 mg by mouth every 6 (six) hours as needed.    [provider]  traMADol (ULTRAM) 50 MG tablet Take 50 mg by mouth every 6 (six) hours as needed.    [provider]    Allergies  Allergen Reactions  . Penicillins Rash    Family History  Problem Relation Age of Onset  . Diabetes Mother   . Cancer Father        Throat   . HIV Brother     Social History Social History   Tobacco Use  . Smoking status: Current Every Day Smoker    Packs/day: 1.00    Years: 45.00    Pack years: 45.00    Types: Cigarettes  . Smokeless tobacco: Never Used  Substance Use Topics  . Alcohol use: Yes    Alcohol/week: 12.6 oz    Types: 21 Cans of beer per week  . Drug use: No    Review of Systems Unable to obtain an accurate review of systems secondary to acute encephalopathy/altered mental status  ____________________________________________   PHYSICAL EXAM:  VITAL SIGNS: ED Triage Vitals  Enc Vitals Group     BP 07/13/18 0841 126/78  Pulse Rate 07/13/18 0841 75     Resp 07/13/18 0841 17     Temp 07/13/18 0841 (!) 97.4 F (36.3 C)     Temp Source 07/13/18 0841 Oral     SpO2 07/13/18 0836 97 %     Weight 07/13/18 0843 133 lb 2.5 oz (60.4 kg)     Height 07/13/18 0843 5\' 10"  (1.778 m)     Head Circumference --      Peak Flow --      Pain Score 07/13/18 0843 0     Pain Loc --      Pain Edu? --      Excl. in [place]? --    Constitutional: Alert, no distress.  Appears calm and comfortable.  Disoriented. Eyes: Normal exam ENT   Head: Normocephalic and atraumatic.   Mouth/Throat: Mucous membranes are moist. Cardiovascular: Normal rate, regular rhythm.  Respiratory: Normal respiratory effort without tachypnea nor retractions. Breath sounds  are clear Gastrointestinal: Soft and nontender. No distention.  Musculoskeletal: Nontender with normal range of motion in all extremities.  Neurologic:  Normal speech and language. No gross focal neurologic deficits Skin:  Skin is warm, dry and intact.  Psychiatric: Mood and affect are normal.  ____________________________________________    RADIOLOGY  CT had negative Chest x-ray negative     ____________________________________________   INITIAL IMPRESSION / ASSESSMENT AND PLAN / ED COURSE  Pertinent labs & imaging results that were available during my care of the patient were reviewed by me and considered in my medical decision making (see chart for details).  Patient presents to the emergency department for altered mental status.  According to family for the past 3 days patient has been very confused and agitated at times.  Currently patient is confused, but calm and cooperative.  Patient has an overall normal physical examination, nontender abdomen.  We will check labs, chest x-ray, CT scan head, urinalysis and continue to closely monitor.  Patient and family agreeable.  Patient's work-up is resulted largely within normal limits with a slight lactic acid elevation.  CT scan of the head and chest x-ray are normal.  Ammonia level normal.  Patient's sodium is slightly lower, but not concerningly so.  Urinalysis pending.  Urinalysis is normal.  Lactate is 2.0.  Besides some mild dehydration his work-up is largely reassuring.  I discussed with family they state 2 weeks ago he was 100% normal, currently he thinks he is in his room at home.  Very disoriented.  We will admit to the hospitalist service given his acute decline amd delirium.  ____________________________________________   FINAL CLINICAL IMPRESSION(S) / ED DIAGNOSES  Altered mental status Delirium   Harvest Dark, MD 07/13/18 1454

## 2018-07-13 NOTE — Progress Notes (Signed)
Family Meeting Note  Advance Directive:{yes  Today a meeting took place with the pt and dter jackie  Patient is being admitted with chest discomfort, feeling fatigued and shortness of breath. She has history of MI status post stent placement. Patient was found to have low potassium she is pretty cardiac. Code status addressed in the ER daughter Kennyth Lose present. Patient wants to be full code.  Time spent 17 minutes  Fritzi Mandes, MD

## 2018-07-13 NOTE — ED Notes (Signed)
Pt repositioned in bed. Pt asking if he can smoke in the rm. Pt made aware that he could not smoke that he was in the hospital. Pt insisting that he needs to smoke and wants to know where he can go to do so. Family at bedside.

## 2018-07-13 NOTE — H&P (Signed)
[place] [facility] [place] NAME: Ian Conley    MR#:  [id number]  DATE OF BIRTH:  11-06-1951  DATE OF ADMISSION:  07/13/2018  PRIMARY CARE PHYSICIAN: Lavera Guise, MD   REQUESTING/REFERRING PHYSICIAN: dr Vicente Males  CHIEF COMPLAINT:  Altered mental status patient's mother and brother present in the emergency room. He is a poor historian unable to give much history.  HISTORY OF PRESENT ILLNESS:  Ian Conley  is a 67 y.o. male with a known history of severe alcohol abuse, hypertension, tobacco abuse comes to the emergency room with increasing confusion at home. Mother and brother I in the room not able to give much history however they tell me patient is not his usual self and has been confused lately. They also report patient has not been eating well however he drinks on a daily basis several beers a day.  Patient lives with his two sons were not in the ER. He is not able to give any history review of systems.  pt was found to be somewhat dehydrated. I rest of his labs are pretty stable. He is being admitted with acute renal failure and failure to thrive. No reported falls or injury.  PAST MEDICAL HISTORY:   Past Medical History:  Diagnosis Date  . Allergy   . Hypertension   . Personal history of tobacco use, presenting hazards to health 03/25/2016  . Polycythemia   . Stroke [facility])    2008 9/11    PAST SURGICAL HISTOIRY:  No past surgical history on file.  SOCIAL HISTORY:   Social History   Tobacco Use  . Smoking status: Current Every Day Smoker    Packs/day: 1.00    Years: 45.00    Pack years: 45.00    Types: Cigarettes  . Smokeless tobacco: Never Used  Substance Use Topics  . Alcohol use: Yes    Alcohol/week: 12.6 oz    Types: 21 Cans of beer per week    FAMILY HISTORY:   Family History  Problem Relation Age of Onset  . Diabetes Mother   . Cancer Father        Throat   . HIV Brother     DRUG ALLERGIES:    Allergies  Allergen Reactions  . Penicillins Rash    Has patient had a PCN reaction causing immediate rash, facial/tongue/throat swelling, SOB or lightheadedness with hypotension: Yes Has patient had a PCN reaction causing severe rash involving mucus membranes or skin necrosis: Unknown Has patient had a PCN reaction that required hospitalization: No Has patient had a PCN reaction occurring within the last 10 years: No If all of the above answers are "NO", then may proceed with Cephalosporin use.    REVIEW OF SYSTEMS:  Review of Systems  Unable to perform ROS: Medical condition     MEDICATIONS AT HOME:   Prior to Admission medications   Medication Sig Start Date End Date Taking? Authorizing Provider  amLODipine (NORVASC) 10 MG tablet Take 1 tab by po daily Patient taking differently: Take 10 mg by mouth daily.  03/22/18  Yes Ronnell Freshwater, NP  aspirin 81 MG tablet Take 81 mg by mouth daily.   Yes [provider]  clopidogrel (PLAVIX) 75 MG tablet Take 1 tablet (75 mg total) by mouth daily. 04/14/18  Yes Boscia, Greer Ee, NP  metoprolol succinate (TOPROL-XL) 25 MG 24 hr tablet take 1 tablet by mouth once daily for blood pressure Patient taking differently: Take  25 mg by mouth daily.  03/22/18  Yes Boscia, Greer Ee, NP      VITAL SIGNS:  Blood pressure 126/78, pulse 75, temperature (!) 97.4 F (36.3 C), temperature source Oral, resp. rate 17, height 5\' 10"  (1.778 m), weight 60.4 kg (133 lb 2.5 oz), SpO2 98 %.  PHYSICAL EXAMINATION:  GENERAL:  67 y.o.-year-old patient lying in the bed with no acute distress. Thin cachectic EYES: Pupils equal, round, reactive to light and accommodation. No scleral icterus. Extraocular muscles intact.  HEENT: Head atraumatic, normocephalic. Oropharynx and nasopharynx clear. Poor dentition NECK:  Supple, no jugular venous distention. No thyroid enlargement, no tenderness.  LUNGS: Normal breath sounds bilaterally, no wheezing, rales,rhonchi  or crepitation. No use of accessory muscles of respiration.  CARDIOVASCULAR: S1, S2 normal. No murmurs, rubs, or gallops.  ABDOMEN: Soft, nontender, nondistended. Bowel sounds present. No organomegaly or mass.  EXTREMITIES: No pedal edema, cyanosis, or clubbing.  NEUROLOGIC: Cranial nerves II through XII are intact. Overall weak  PSYCHIATRIC: The patient is alert and confused SKIN: No obvious rash, lesion, or ulcer.   LABORATORY PANEL:   CBC Recent Labs  Lab 07/13/18 0854  WBC 4.2  HGB 16.4  HCT 48.5  PLT 77*   ------------------------------------------------------------------------------------------------------------------  Chemistries  Recent Labs  Lab 07/13/18 0854  NA 130*  K 4.0  CL 101  CO2 19*  GLUCOSE 139*  BUN 23  CREATININE 1.38*  CALCIUM 8.8*  AST 38  ALT 31  ALKPHOS 81  BILITOT 1.1   ------------------------------------------------------------------------------------------------------------------  Cardiac Enzymes Recent Labs  Lab 07/13/18 0854  TROPONINI <0.03   ------------------------------------------------------------------------------------------------------------------  RADIOLOGY:  Dg Chest 2 View  Result Date: 07/13/2018 CLINICAL DATA:  Failure to thrive EXAM: CHEST - 2 VIEW COMPARISON:  07/29/2017 FINDINGS: Cardiac shadow is within normal limits. The lungs are hyperinflated. Mild scarring is noted in the apices bilaterally similar to that noted on prior CT examination. No focal infiltrate or sizable effusion is seen. No acute bony abnormality is noted. Degenerative changes in the thoracic spine are seen. IMPRESSION: Chronic changes without acute abnormality. Electronically Signed   By: Inez Catalina M.D.   On: 07/13/2018 09:34   Ct Head Wo Contrast  Result Date: 07/13/2018 CLINICAL DATA:  Encephalopathy EXAM: CT HEAD WITHOUT CONTRAST TECHNIQUE: Contiguous axial images were obtained from the base of the skull through the vertex without  intravenous contrast. COMPARISON:  MRI head 09/02/2007, CT head 09/02/2007 FINDINGS: Brain: Moderate atrophy with progression since the prior study. Negative for hydrocephalus. Microvascular ischemic changes in the white matter and right thalamus have progressed significantly since the prior study. No acute infarct, hemorrhage, mass. No midline shift. Vascular: Negative for acute vascular thrombosis. Skull: Negative Sinuses/Orbits: Mild mucosal edema paranasal sinuses.  Normal orbit. Other: None IMPRESSION: Progression of atrophy and chronic microvascular ischemia compared with 2008. No acute abnormality. Electronically Signed   By: Franchot Gallo M.D.   On: 07/13/2018 09:46    EKG:    IMPRESSION AND PLAN:   Ian Conley  is a 67 y.o. male with a known history of severe alcohol abuse, hypertension, tobacco abuse comes to the emergency room with increasing confusion at home. Mother and brother I in the room not able to give much history however they tell me patient is not his usual self and has been confused lately. They also report patient has not been eating well however he drinks on a daily basis several beers a day.  1. altered mental status/failure to thrive/alcohol abuse/??organic brain syndrome -  admit patient to medical floor for overnight observation -IV fluids -consider MRI of the brain  2. alcohol abuse, failure to thrive, poor PO intake, malnutrition protein calorie severe -CIWA protocol -PO folate, thiamine, multivitamin -IV fluids -dietitian consultation  3. History of secondary polycythemia -follows at the cancer center -H&H appears stable at present  4. History of hypertension with relative hypotension -hold BP meds  5. DVT prophylaxis subcu Lovenox  Physical therapy to see patient management for discharge planning   All the records are reviewed and case discussed with ED provider. Management plans discussed with the patient, family and they are in agreement.  CODE  STATUS: full  TOTAL TIME TAKING CARE OF THIS PATIENT: 68minutes.    Fritzi Mandes M.D on 07/13/2018 at 3:47 PM  Between 7am to 6pm - Pager - [phone]  After 6pm go to [email or web address] - password [email or web address] [facility]  [facility]  Office  [phone]  CC: Primary care physician; Lavera Guise, MD

## 2018-07-13 NOTE — ED Notes (Signed)
Date and time results received: 07/13/18 1000 Test: Lactic acid Critical Value: 2.3 Name of Provider Notified: Paduchowski Orders Received? Or Actions Taken?: acknowledged

## 2018-07-14 ENCOUNTER — Observation Stay: Payer: Medicare Other

## 2018-07-14 DIAGNOSIS — E43 Unspecified severe protein-calorie malnutrition: Secondary | ICD-10-CM

## 2018-07-14 DIAGNOSIS — R4182 Altered mental status, unspecified: Secondary | ICD-10-CM

## 2018-07-14 DIAGNOSIS — I6389 Other cerebral infarction: Secondary | ICD-10-CM | POA: Diagnosis not present

## 2018-07-14 DIAGNOSIS — I63233 Cerebral infarction due to unspecified occlusion or stenosis of bilateral carotid arteries: Secondary | ICD-10-CM | POA: Diagnosis not present

## 2018-07-14 LAB — URINE CULTURE: Culture: 10000 — AB

## 2018-07-14 LAB — TROPONIN I
Troponin I: <0.03 ng/mL (ref <0.03)
Troponin I: <0.03 ng/mL (ref <0.03)

## 2018-07-14 LAB — PHOSPHORUS: Phosphorus: 3.5 mg/dL (ref 2.5–4.6)

## 2018-07-14 LAB — BASIC METABOLIC PANEL
ANION GAP: 8 (ref 5–15)
BUN: 19 mg/dL (ref 8–23)
CALCIUM: 8.6 mg/dL — AB (ref 8.9–10.3)
CO2: 20 mmol/L — ABNORMAL LOW (ref 22–32)
Chloride: 105 mmol/L (ref 98–111)
Creatinine, Ser: 0.99 mg/dL (ref 0.61–1.24)
GLUCOSE: 81 mg/dL (ref 70–99)
Potassium: 4.4 mmol/L (ref 3.5–5.1)
Sodium: 133 mmol/L — ABNORMAL LOW (ref 135–145)

## 2018-07-14 LAB — MAGNESIUM: Magnesium: 2.1 mg/dL (ref 1.7–2.4)

## 2018-07-14 LAB — AMMONIA: Ammonia: 9 umol/L (ref 9–35)

## 2018-07-14 MED ORDER — STROKE: EARLY STAGES OF RECOVERY BOOK
Freq: Once | Status: AC
  Administered 2018-07-14: 20:00:00

## 2018-07-14 MED ORDER — ASPIRIN-DIPYRIDAMOLE ER 25-200 MG PO CP12
1.0000 | ORAL_CAPSULE | Freq: Two times a day (BID) | ORAL | Status: DC
  Administered 2018-07-14 – 2018-07-16 (×3): 1 via ORAL
  Filled 2018-07-14 (×5): qty 1

## 2018-07-14 MED ORDER — FOLIC ACID 5 MG/ML IJ SOLN: 1.0000 mg | Freq: Every day | INTRAMUSCULAR | Status: DC

## 2018-07-14 MED ORDER — CEFAZOLIN SODIUM-DEXTROSE 2-4 GM/100ML-% IV SOLN
2.0000 g | Freq: Three times a day (TID) | INTRAVENOUS | Status: DC
  Administered 2018-07-14 – 2018-07-16 (×6): 2 g via INTRAVENOUS
  Filled 2018-07-14 (×8): qty 100

## 2018-07-14 MED ORDER — ENSURE ENLIVE PO LIQD: 237.0000 mL | Freq: Two times a day (BID) | ORAL | Status: DC

## 2018-07-14 MED ORDER — DEXTROSE-NACL 5-0.9 % IV SOLN: INTRAVENOUS | Status: DC

## 2018-07-14 MED ORDER — ATORVASTATIN CALCIUM 20 MG PO TABS: 20.0000 mg | ORAL_TABLET | Freq: Every day | ORAL | Status: DC

## 2018-07-14 MED ORDER — THIAMINE HCL 100 MG/ML IJ SOLN
INTRAVENOUS | Status: AC
  Administered 2018-07-14 – 2018-07-15 (×2): via INTRAVENOUS
  Filled 2018-07-14 (×2): qty 1000

## 2018-07-14 MED ORDER — ATORVASTATIN CALCIUM 20 MG PO TABS
80.0000 mg | ORAL_TABLET | Freq: Every day | ORAL | Status: DC
  Administered 2018-07-14 – 2018-07-15 (×2): 80 mg via ORAL
  Filled 2018-07-14 (×2): qty 4

## 2018-07-14 NOTE — Progress Notes (Signed)
[place] at [place] NAME: Ian Conley    MR#:  [id number]  DATE OF BIRTH:  [birth year]  SUBJECTIVE:  CHIEF COMPLAINT:   Chief Complaint  Patient presents with  . Altered Mental Status  Patient is obtunded, family friend at the bedside, patient did receive Ativan overnight and is on [place] protocol, will place n.p.o. for now, check MRI of the brain for further evaluation, aspiration/fall/skin care precautions  REVIEW OF SYSTEMS:  CONSTITUTIONAL: No fever, fatigue or weakness.  EYES: No blurred or double vision.  EARS, NOSE, AND THROAT: No tinnitus or ear pain.  RESPIRATORY: No cough, shortness of breath, wheezing or hemoptysis.  CARDIOVASCULAR: No chest pain, orthopnea, edema.  GASTROINTESTINAL: No nausea, vomiting, diarrhea or abdominal pain.  GENITOURINARY: No dysuria, hematuria.  ENDOCRINE: No polyuria, nocturia,  HEMATOLOGY: No anemia, easy bruising or bleeding SKIN: No rash or lesion. MUSCULOSKELETAL: No joint pain or arthritis.   NEUROLOGIC: No tingling, numbness, weakness.  PSYCHIATRY: No anxiety or depression.   ROS  DRUG ALLERGIES:   Allergies  Allergen Reactions  . Penicillins Rash    Has patient had a PCN reaction causing immediate rash, facial/tongue/throat swelling, SOB or lightheadedness with hypotension: Yes Has patient had a PCN reaction causing severe rash involving mucus membranes or skin necrosis: Unknown Has patient had a PCN reaction that required hospitalization: No Has patient had a PCN reaction occurring within the last 10 years: No If all of the above answers are "NO", then may proceed with Cephalosporin use.    VITALS:  Blood pressure 131/75, pulse 68, temperature (!) 97.3 F (36.3 C), temperature source Oral, resp. rate 18, height 5\' 10"  (1.778 m), weight 60.4 kg (133 lb 2.5 oz), SpO2 100 %.  PHYSICAL EXAMINATION:  GENERAL:  67 y.o.-year-old patient lying in the bed with no acute distress.  EYES:  Pupils equal, round, reactive to light and accommodation. No scleral icterus. Extraocular muscles intact.  HEENT: Head atraumatic, normocephalic. Oropharynx and nasopharynx clear.  NECK:  Supple, no jugular venous distention. No thyroid enlargement, no tenderness.  LUNGS: Normal breath sounds bilaterally, no wheezing, rales,rhonchi or crepitation. No use of accessory muscles of respiration.  CARDIOVASCULAR: S1, S2 normal. No murmurs, rubs, or gallops.  ABDOMEN: Soft, nontender, nondistended. Bowel sounds present. No organomegaly or mass.  EXTREMITIES: No pedal edema, cyanosis, or clubbing.  NEUROLOGIC: Cranial nerves II through XII are intact. Muscle strength 5/5 in all extremities. Sensation intact. Gait not checked.  PSYCHIATRIC: The patient is alert and oriented x 3.  SKIN: No obvious rash, lesion, or ulcer.   Physical Exam LABORATORY PANEL:   CBC Recent Labs  Lab 07/13/18 0854  WBC 4.2  HGB 16.4  HCT 48.5  PLT 77*   ------------------------------------------------------------------------------------------------------------------  Chemistries  Recent Labs  Lab 07/13/18 0854 07/14/18 0902  NA 130* 133*  K 4.0 4.4  CL 101 105  CO2 19* 20*  GLUCOSE 139* 81  BUN 23 19  CREATININE 1.38* 0.99  CALCIUM 8.8* 8.6*  MG  --  2.1  AST 38  --   ALT 31  --   ALKPHOS 81  --   BILITOT 1.1  --    ------------------------------------------------------------------------------------------------------------------  Cardiac Enzymes Recent Labs  Lab 07/13/18 0854  TROPONINI <0.03   ------------------------------------------------------------------------------------------------------------------  RADIOLOGY:  Dg Chest 2 View  Result Date: 07/13/2018 CLINICAL DATA:  Failure to thrive EXAM: CHEST - 2 VIEW COMPARISON:  07/29/2017 FINDINGS: Cardiac shadow is within normal limits. The lungs are hyperinflated.  Mild scarring is noted in the apices bilaterally similar to that noted on prior  CT examination. No focal infiltrate or sizable effusion is seen. No acute bony abnormality is noted. Degenerative changes in the thoracic spine are seen. IMPRESSION: Chronic changes without acute abnormality. Electronically Signed   By: Inez Catalina M.D.   On: 07/13/2018 09:34   Ct Head Wo Contrast  Result Date: 07/13/2018 CLINICAL DATA:  Encephalopathy EXAM: CT HEAD WITHOUT CONTRAST TECHNIQUE: Contiguous axial images were obtained from the base of the skull through the vertex without intravenous contrast. COMPARISON:  MRI head 09/02/2007, CT head 09/02/2007 FINDINGS: Brain: Moderate atrophy with progression since the prior study. Negative for hydrocephalus. Microvascular ischemic changes in the white matter and right thalamus have progressed significantly since the prior study. No acute infarct, hemorrhage, mass. No midline shift. Vascular: Negative for acute vascular thrombosis. Skull: Negative Sinuses/Orbits: Mild mucosal edema paranasal sinuses.  Normal orbit. Other: None IMPRESSION: Progression of atrophy and chronic microvascular ischemia compared with 2008. No acute abnormality. Electronically Signed   By: Franchot Gallo M.D.   On: 07/13/2018 09:46    ASSESSMENT AND PLAN:  Ian Conley  is a 67 y.o. male with a known history of severe alcohol abuse, hypertension, tobacco abuse comes to the emergency room with increasing confusion at home. Mother and brother I in the room not able to give much history however they tell me patient is not his usual self and has been confused lately. They also report patient has not been eating well however he drinks on a daily basis several beers a day.  *Acute toxic metabolic encephalopathy  Worse per nursing staff clinically Exact etiology is unknown  -noted history of alcohol abuse/?organic brain syndrome vs acute UTI versus seizure Ammonia level normal, avoid psychotropic meds, aspiration/fall/skin care precautions while in house, check MRI of the brain for  further evaluation, neurochecks per routine, head of bed at 30 degrees, consult neurology for expert opinion, EEG, rule out acute coronary syndrome with cardiac enzymes x3 sets, and continue close medical monitoring  *Chronic alcohol abuse with associated chronic hyponatremia/thrombocytopenia Continue alcohol withdrawal protocol Ammonia levels normal  *Acute on chronic adult FTT with moderate to severe protein calorie/energy malnutrition  Dietary consulted  *Acute group B strep UTI  Stable Ancef IV for 3-day course   *History of CVA Continue dual antiplatelet therapy with aspirin/Plavix  *History of polycythemia Stable We will need to follow-up with hematology/oncology status post discharge for continued medical management  *Chronic hypertension Stable   All the records are reviewed and case discussed with Care Management/Social Workerr. Management plans discussed with the patient, family and they are in agreement.  CODE STATUS: full  TOTAL TIME TAKING CARE OF THIS PATIENT: 35 minutes.     POSSIBLE D/C IN 2-3 DAYS, DEPENDING ON CLINICAL CONDITION.   Ian Conley M.D on 07/14/2018   Between 7am to 6pm - Pager - [phone]  After 6pm go to [email or web address] - password [email or web address] [facility]  Office  [phone]  CC: Primary care physician; Lavera Guise, MD  Note: This dictation was prepared with Dragon dictation along with smaller phrase technology. Any transcriptional errors that result from this process are unintentional.

## 2018-07-14 NOTE — Progress Notes (Signed)
PT Cancellation Note  Patient Details Name: Ian Conley MRN: [id number] DOB: 1951/12/03   Cancelled Treatment:    Reason Eval/Treat Not Completed: Fatigue/lethargy limiting ability to participate; Unable to wake pt, spoke to Dr. Jerelyn Charles who requested PT hold this date secondary to lethargy.  Will attempt to see pt at a future date as medically appropriate.     Linus Salmons PT, DPT 07/14/18, 11:08 AM

## 2018-07-14 NOTE — Progress Notes (Signed)
eeg completed ° °

## 2018-07-14 NOTE — Progress Notes (Signed)
Patient has been very drowsy/lethargic today.  Dr Jerelyn Charles.  Patient is currently having an MRI

## 2018-07-14 NOTE — Progress Notes (Signed)
Initial Nutrition Assessment  DOCUMENTATION CODES:   Severe malnutrition in context of chronic illness  INTERVENTION:   Pt likely at high refeeding risk; recommend monitor K, P, and Mg labs when oral intake improves.   Ensure Enlive po BID, each supplement provides 350 kcal and 20 grams of protein  MVI, thiamine, folic acid daily  Magic cup TID with meals, each supplement provides 290 kcal and 9 grams of protein  Dysphagia 3 diet   NUTRITION DIAGNOSIS:   Severe Malnutrition related to social / environmental circumstances(etoh abuse ) as evidenced by moderate fat depletion, moderate muscle depletion, 11 percent weight loss in 3 months.  GOAL:   Patient will meet greater than or equal to 90% of their needs  MONITOR:   PO intake, Supplement acceptance, Labs, Weight trends, I & O's, Skin  REASON FOR ASSESSMENT:   Consult Poor PO  ASSESSMENT:   67 y.o. male with a known history of severe alcohol abuse, hypertension, tobacco abuse comes to the emergency room with increasing confusion at home   Visited pt's room today. Unable to arouse pt long enough to obtain nutrition related history. Per chart review, pt with heavy etoh use and poor oral intake pta. Per chart, pt has lost 16lbs(11%) in 3 months; this is significant. RD will add supplements to help pt meet his estimated needs. RD will also change pt to dysphagia 3 diet as he appears to have poor dentition. Pt likely at high refeeding risk; recommend monitor K, P, and Mg labs when oral intake improves.   Medications reviewed and include: aspirin, plavix, lovenx, folic acid, MVI, thiamine, NaCl @75ml /hr  Labs reviewed: Na 133(L), K 4.4 wnl, P 3.5 wnl, Mg 2.1 wnl  NUTRITION - FOCUSED PHYSICAL EXAM:    Most Recent Value  Orbital Region  Mild depletion  Upper Arm Region  Moderate depletion  Thoracic and Lumbar Region  Moderate depletion  Buccal Region  Moderate depletion  Temple Region  Mild depletion  Clavicle Bone Region   Moderate depletion  Clavicle and Acromion Bone Region  Moderate depletion  Scapular Bone Region  Moderate depletion  Dorsal Hand  Severe depletion  Patellar Region  Moderate depletion  Anterior Thigh Region  Moderate depletion  Posterior Calf Region  Severe depletion  Edema (RD Assessment)  Mild  Hair  Reviewed  Eyes  Reviewed  Mouth  Reviewed  Skin  Reviewed  Nails  Reviewed     Diet Order:   Diet Order           DIET SOFT Room service appropriate? Yes; Fluid consistency: Thin  Diet effective now         EDUCATION NEEDS:   Not appropriate for education at this time  Skin:  Skin Assessment: Reviewed RN Assessment  Last BM:  7/23  Height:   Ht Readings from Last 1 Encounters:  07/13/18 5\' 10"  (1.778 m)    Weight:   Wt Readings from Last 1 Encounters:  07/13/18 133 lb 2.5 oz (60.4 kg)    Ideal Body Weight:  75.4 kg  BMI:  Body mass index is 19.11 kg/m.  Estimated Nutritional Needs:   Kcal:  1800-2100kcal/day   Protein:  90-103g/day   Fluid:  >1.5L/day   Koleen Distance MS, RD, LDN Pager #- [phone] Office#- [phone] After Hours Pager: [phone]

## 2018-07-14 NOTE — Procedures (Signed)
ELECTROENCEPHALOGRAM REPORT   Patient: Ian Conley       Room #: 205A-AA EEG No. ID: [id number] Age: 67 y.o.        Sex: male Referring Physician: Ladell Heads Report Date:  07/14/2018        Interpreting Physician: Alexis Goodell  History: [name] is an 67 y.o. male with altered mental status  Medications:  ASA, Lipitor, Ancef, Plavix  Conditions of Recording:  This is a 21 channel routine scalp EEG performed with bipolar and monopolar montages arranged in accordance to the international 10/20 system of electrode placement. One channel was dedicated to EKG recording.  The patient is in the awake and briefly drowsy states.  Description:  Muscle and movement artifact is prominent during the recording due to the patient's difficulty in cooperating.  When visualized the waking background activity consists of a low voltage, symmetrical, fairly well organized, 8 Hz alpha activity, seen from the parieto-occipital and posterior temporal regions.  Low voltage fast activity, poorly organized, is seen anteriorly and is at times superimposed on more posterior regions.  A mixture of theta and alpha rhythms are seen from the central and temporal regions. The patient drowses briefly with slowing to irregular, low voltage theta and beta activity.   Stage II sleep is not obtained. No epileptiform activity is noted.   Hyperventilation was not performed.  Intermittent photic stimulation was performed but failed to illicit any change in the tracing.   IMPRESSION: Normal electroencephalogram, awake, briefly drowsy and with activation procedures. There are no focal lateralizing or epileptiform features.   Alexis Goodell, MD Neurology [phone] 07/14/2018, 4:31 PM

## 2018-07-14 NOTE — Evaluation (Signed)
Clinical/Bedside Swallow Evaluation Patient Details  Name: VENKAT ANKNEY MRN: [id number] Date of Birth: 17-Jun-1951  Today's Date: 07/14/2018 Time: SLP Start Time (ACUTE ONLY): 57 SLP Stop Time (ACUTE ONLY): 1658 SLP Time Calculation (min) (ACUTE ONLY): 33 min  Past Medical History:  Past Medical History:  Diagnosis Date  . Allergy   . Hypertension   . Personal history of tobacco use, presenting hazards to health 03/25/2016  . Polycythemia   . Stroke [facility])    2008 9/11   Past Surgical History: History reviewed. No pertinent surgical history. HPI:  Per admitting H&P: Ian Conley  is a 67 y.o. male with a known history of severe alcohol abuse, hypertension, tobacco abuse comes to the emergency room with increasing confusion at home. Mother and brother I in the room not able to give much history however they tell me patient is not his usual self and has been confused lately. They also report patient has not been eating well however he drinks on a daily basis several beers a day.   Assessment / Plan / Recommendation Clinical Impression    Patient presents with s/s moderate oropharyngeal dysphagia at bedside. Patient presents with s/s consistent with laryngeal penetration and/or aspiration with nectar thick liquids by cup c/b wet, gurgly vocal quality and likely reduced laryngeal elevation. Oral phase c/b oral holding of all boluses tested with mild to mod oral prep difficulties and transit delay, greatest delay with sips of nectar and honey thick liquid. Oral mech exam grossly revealed mildly increased L>R sided oral motor deficits, with exception of significant L facial droop. Labial seal WFL for all consistencies tested.  Improved swallow initiation noted with teaspoons honey thick liquid and puree. Recommend Dysphagia I diet with honey thick liquids to be given by spoon only, feed only when alert, ensuring patient has swallowed before offering next tsp puree/honey thick liquid, assistance  with all meals, meds to be crushed in puree, and strict aspiration precautions. Possible MBSS when patient becomes consistently alert. SLP to follow for toleration of diet and provide trials of upgraded consistency.   SLP Visit Diagnosis: Dysphagia, oropharyngeal phase (R13.12)    Aspiration Risk  Moderate aspiration risk    Diet Recommendation Dysphagia 1 (Puree);Honey-thick liquid(by teaspoon only)   Liquid Administration via: Spoon Medication Administration: Crushed with puree Supervision: Full supervision/cueing for compensatory strategies Compensations: Minimize environmental distractions;Slow rate;Small sips/bites;Follow solids with liquid Postural Changes: Seated upright at 90 degrees;Remain upright for at least 30 minutes after po intake    Other  Recommendations Oral Care Recommendations: Oral care BID   Follow up Recommendations Home health SLP      Frequency and Duration min 2x/week  2 weeks       Prognosis Prognosis for Safe Diet Advancement: Fair Barriers to Reach Goals: Cognitive deficits;Severity of deficits      Swallow Study   General Date of Onset: 07/14/18 HPI: Per admitting H&P: Ian Conley  is a 67 y.o. male with a known history of severe alcohol abuse, hypertension, tobacco abuse comes to the emergency room with increasing confusion at home. Mother and brother I in the room not able to give much history however they tell me patient is not his usual self and has been confused lately. They also report patient has not been eating well however he drinks on a daily basis several beers a day. Type of Study: Bedside Swallow Evaluation Diet Prior to this Study: NPO Behavior/Cognition: Cooperative;Pleasant mood;Other (Comment)(mildly drowsy) Oral Cavity Assessment: Within Functional Limits(mottled appearance,  pink and brown areas) Oral Cavity - Dentition: Dentures, not available;Missing dentition;Other (Comment)(endentulous on top) Patient Positioning: Upright in  bed Baseline Vocal Quality: Low vocal intensity    Oral/Motor/Sensory Function Overall Oral Motor/Sensory Function: Moderate impairment Facial ROM: Reduced left Facial Symmetry: Abnormal symmetry left Facial Strength: Reduced right;Reduced left Facial Sensation: Within Functional Limits Lingual ROM: Reduced right;Reduced left;Other (Comment)(reduced Left greater than Right) Lingual Symmetry: Abnormal symmetry left(mild) Lingual Strength: Reduced Mandible: Within Functional Limits   Ice Chips Ice chips: Not tested   Thin Liquid Thin Liquid: Not tested    Nectar Thick Nectar Thick Liquid: Impaired Presentation: Cup Oral Phase Impairments: Reduced lingual movement/coordination Oral phase functional implications: Oral holding Pharyngeal Phase Impairments: Suspected delayed Swallow   Honey Thick Honey Thick Liquid: Impaired Presentation: Cup;Spoon Oral Phase Impairments: Reduced lingual movement/coordination Oral Phase Functional Implications: Oral holding;Other (comment)(Increased oral holding with honey thick by cup) Pharyngeal Phase Impairments: Suspected delayed Swallow;Multiple swallows(Greater delay in swallow initiation with honey thick by cup ) Other Comments: More timely swallow with tsp's honey thick(Increased swallow delay with sips of honey thick by cup)   Puree Puree: Impaired Presentation: Spoon Oral Phase Impairments: Reduced lingual movement/coordination Oral Phase Functional Implications: Prolonged oral transit   Solid     Solid: Not tested      Terez Freimark 07/14/2018,5:07 PM

## 2018-07-14 NOTE — Progress Notes (Signed)
MRI noted for acute stroke Will start on stroke protocol -patient is outside window for TPA Discontinue aspirin/Plavix-start Aggrenox twice daily, increase statin therapy, neurology to see

## 2018-07-15 ENCOUNTER — Observation Stay
Admit: 2018-07-15 | Discharge: 2018-07-15 | Disposition: A | Discharge status: Home / Self Care | Payer: Medicare Other | Attending: Family Medicine | Admitting: Family Medicine

## 2018-07-15 DIAGNOSIS — I959 Hypotension, unspecified: Secondary | ICD-10-CM | POA: Diagnosis present

## 2018-07-15 DIAGNOSIS — N179 Acute kidney failure, unspecified: Secondary | ICD-10-CM | POA: Diagnosis present

## 2018-07-15 DIAGNOSIS — Z681 Body mass index (BMI) 19 or less, adult: Secondary | ICD-10-CM | POA: Diagnosis not present

## 2018-07-15 DIAGNOSIS — Z7902 Long term (current) use of antithrombotics/antiplatelets: Secondary | ICD-10-CM | POA: Diagnosis not present

## 2018-07-15 DIAGNOSIS — F1010 Alcohol abuse, uncomplicated: Secondary | ICD-10-CM | POA: Diagnosis present

## 2018-07-15 DIAGNOSIS — I639 Cerebral infarction, unspecified: Secondary | ICD-10-CM | POA: Diagnosis present

## 2018-07-15 DIAGNOSIS — I10 Essential (primary) hypertension: Secondary | ICD-10-CM | POA: Diagnosis present

## 2018-07-15 DIAGNOSIS — Z880 Allergy status to penicillin: Secondary | ICD-10-CM | POA: Diagnosis not present

## 2018-07-15 DIAGNOSIS — E871 Hypo-osmolality and hyponatremia: Secondary | ICD-10-CM | POA: Diagnosis present

## 2018-07-15 DIAGNOSIS — R4182 Altered mental status, unspecified: Secondary | ICD-10-CM | POA: Diagnosis present

## 2018-07-15 DIAGNOSIS — D751 Secondary polycythemia: Secondary | ICD-10-CM | POA: Diagnosis present

## 2018-07-15 DIAGNOSIS — D696 Thrombocytopenia, unspecified: Secondary | ICD-10-CM | POA: Diagnosis present

## 2018-07-15 DIAGNOSIS — E43 Unspecified severe protein-calorie malnutrition: Secondary | ICD-10-CM | POA: Diagnosis present

## 2018-07-15 DIAGNOSIS — B951 Streptococcus, group B, as the cause of diseases classified elsewhere: Secondary | ICD-10-CM | POA: Diagnosis present

## 2018-07-15 DIAGNOSIS — Z79899 Other long term (current) drug therapy: Secondary | ICD-10-CM | POA: Diagnosis not present

## 2018-07-15 DIAGNOSIS — E860 Dehydration: Secondary | ICD-10-CM | POA: Diagnosis present

## 2018-07-15 DIAGNOSIS — F17210 Nicotine dependence, cigarettes, uncomplicated: Secondary | ICD-10-CM | POA: Diagnosis present

## 2018-07-15 DIAGNOSIS — R627 Adult failure to thrive: Secondary | ICD-10-CM | POA: Diagnosis present

## 2018-07-15 DIAGNOSIS — Z8673 Personal history of transient ischemic attack (TIA), and cerebral infarction without residual deficits: Secondary | ICD-10-CM | POA: Diagnosis not present

## 2018-07-15 DIAGNOSIS — Z7982 Long term (current) use of aspirin: Secondary | ICD-10-CM | POA: Diagnosis not present

## 2018-07-15 DIAGNOSIS — N390 Urinary tract infection, site not specified: Secondary | ICD-10-CM | POA: Diagnosis present

## 2018-07-15 DIAGNOSIS — G92 Toxic encephalopathy: Secondary | ICD-10-CM | POA: Diagnosis present

## 2018-07-15 LAB — CBC
HCT: 41.5 % (ref 40.0–52.0)
HEMOGLOBIN: 13.9 g/dL (ref 13.0–18.0)
MCH: 30 pg (ref 26.0–34.0)
MCHC: 33.5 g/dL (ref 32.0–36.0)
MCV: 89.6 fL (ref 80.0–100.0)
Platelets: 59 10*3/uL — ABNORMAL LOW (ref 150–440)
RBC: 4.62 MIL/uL (ref 4.40–5.90)
RDW: 13.4 % (ref 11.5–14.5)
WBC: 3.8 10*3/uL (ref 3.8–10.6)

## 2018-07-15 LAB — COMPREHENSIVE METABOLIC PANEL
ALK PHOS: 76 U/L (ref 38–126)
ALT: 25 U/L (ref 0–44)
AST: 24 U/L (ref 15–41)
Albumin: 2.4 g/dL — ABNORMAL LOW (ref 3.5–5.0)
Anion gap: 6 (ref 5–15)
BUN: 13 mg/dL (ref 8–23)
CALCIUM: 8.4 mg/dL — AB (ref 8.9–10.3)
CHLORIDE: 104 mmol/L (ref 98–111)
CO2: 22 mmol/L (ref 22–32)
CREATININE: 0.83 mg/dL (ref 0.61–1.24)
GFR calc Af Amer: >60 mL/min (ref >60)
GFR calc non Af Amer: >60 mL/min (ref >60)
Glucose, Bld: 108 mg/dL — ABNORMAL HIGH (ref 70–99)
Potassium: 3.6 mmol/L (ref 3.5–5.1)
SODIUM: 132 mmol/L — AB (ref 135–145)
Total Bilirubin: 0.8 mg/dL (ref 0.3–1.2)
Total Protein: 5.6 g/dL — ABNORMAL LOW (ref 6.5–8.1)

## 2018-07-15 LAB — ECHOCARDIOGRAM COMPLETE
Height: 70 in
WEIGHTICAEL: 2130.53 [oz_av]

## 2018-07-15 LAB — LIPID PANEL
CHOLESTEROL: 115 mg/dL (ref 0–200)
HDL: 36 mg/dL — ABNORMAL LOW (ref >40)
LDL Cholesterol: 54 mg/dL (ref 0–99)
Total CHOL/HDL Ratio: 3.2 RATIO
Triglycerides: 126 mg/dL (ref <150)
VLDL: 25 mg/dL (ref 0–40)

## 2018-07-15 LAB — HEMOGLOBIN A1C
Hgb A1c MFr Bld: 6.2 % — ABNORMAL HIGH (ref 4.8–5.6)
Mean Plasma Glucose: 131.24 mg/dL

## 2018-07-15 LAB — TROPONIN I

## 2018-07-15 LAB — TSH: TSH: 1.697 u[IU]/mL (ref 0.350–4.500)

## 2018-07-15 LAB — SEDIMENTATION RATE: Sed Rate: 30 mm/hr — ABNORMAL HIGH (ref 0–20)

## 2018-07-15 LAB — RPR: RPR Ser Ql: NONREACTIVE

## 2018-07-15 LAB — VITAMIN B12: Vitamin B-12: 602 pg/mL (ref 180–914)

## 2018-07-15 MED ORDER — NEPRO/CARBSTEADY PO LIQD
237.0000 mL | Freq: Two times a day (BID) | ORAL | Status: DC
  Administered 2018-07-16: 237 mL via ORAL

## 2018-07-15 NOTE — NC FL2 (Signed)
[place] LEVEL OF CARE SCREENING TOOL     IDENTIFICATION  Patient Name: Ian Conley Birthdate: Aug 17, 1951 Sex: male Admission Date (Current Location): 07/13/2018  [place] and [place] Number:  Engineering geologist and Address:  [facility], [address], [place], [place] [address]      Provider Number: [id number]  Attending Physician Name and Address:  Gorden Harms, MD  Relative Name and Phone Number:  Despina Hidden- sister [phone]    Current Level of Care: Hospital Recommended Level of Care: [place] Prior Approval Number:    Date Approved/Denied:   PASRR Number: [id number]  Discharge Plan: SNF    Current Diagnoses: Patient Active Problem List   Diagnosis Date Noted  . CVA (cerebral vascular accident) ([place]) 07/15/2018  . Protein-calorie malnutrition, severe 07/14/2018  . Altered mental status 07/13/2018  . Sebaceous cyst 05/17/2018  . Essential hypertension 05/04/2018  . Acute upper respiratory infection 05/04/2018  . Primary osteoarthritis of right hip 05/04/2018  . Cerebrovascular disease 05/04/2018  . Localized superficial swelling, mass, or lump 05/04/2018  . Screening for prostate cancer 05/04/2018  . Polycythemia, secondary 09/05/2016  . Personal history of tobacco use, presenting hazards to health 03/25/2016  . Gastric wall thickening 03/25/2016  . Alkaline phosphatase elevation 03/10/2016    Orientation RESPIRATION BLADDER Height & Weight     Place, Time, Self  Normal Incontinent Weight: 133 lb 2.5 oz (60.4 kg) Height:  5\' 10"  (177.8 cm)  BEHAVIORAL SYMPTOMS/MOOD NEUROLOGICAL BOWEL NUTRITION STATUS  (None) (none) Continent Diet(Dysphagia 2 )  AMBULATORY STATUS COMMUNICATION OF NEEDS Skin   Extensive Assist Verbally Normal                       Personal Care Assistance Level of Assistance  Bathing, Feeding, Dressing Bathing Assistance: Limited assistance Feeding  assistance: Independent Dressing Assistance: Limited assistance     Functional Limitations Info  Sight, Hearing, Speech Sight Info: Adequate Hearing Info: Adequate Speech Info: Adequate    SPECIAL CARE FACTORS FREQUENCY  PT (By licensed PT), OT (By licensed OT)     PT Frequency: 5 OT Frequency: 5            Contractures Contractures Info: Not present    Additional Factors Info  Allergies, Code Status Code Status Info: Full Code  Allergies Info: Penicillins            Current Medications (07/15/2018):  This is the current hospital active medication list Current Facility-Administered Medications  Medication Dose Route Frequency Provider Last Rate Last Dose  . acetaminophen (TYLENOL) tablet 650 mg  650 mg Oral Q6H PRN Fritzi Mandes, MD       Or  . acetaminophen (TYLENOL) suppository 650 mg  650 mg Rectal Q6H PRN Fritzi Mandes, MD      . atorvastatin (LIPITOR) tablet 80 mg  80 mg Oral q1800 Salary, Holly Bodily D, MD   80 mg at 07/14/18 1959  . ceFAZolin (ANCEF) IVPB 2g/100 mL premix  2 g Intravenous Q8H Salary, Montell D, MD 200 mL/hr at 07/15/18 1410 2 g at 07/15/18 1410  . dextrose 5 % 1,000 mL with multivitamins adult (MVI -12) 10 mL, folic acid 1 mg, thiamine (B-1) 100 mg infusion   Intravenous Continuous Salary, Montell D, MD 75 mL/hr at 07/15/18 0626    . dipyridamole-aspirin (AGGRENOX) 200-25 MG per 12 hr capsule 1 capsule  1 capsule Oral BID Salary, Avel Peace, MD   1 capsule  at 07/14/18 2000  . enoxaparin (LOVENOX) injection 40 mg  40 mg Subcutaneous Q24H Fritzi Mandes, MD   40 mg at 07/14/18 2140  . feeding supplement (ENSURE ENLIVE) (ENSURE ENLIVE) liquid 237 mL  237 mL Oral BID BM Salary, Montell D, MD      . LORazepam (ATIVAN) tablet 1 mg  1 mg Oral Q6H PRN Fritzi Mandes, MD       Or  . LORazepam (ATIVAN) injection 1 mg  1 mg Intravenous Q6H PRN Fritzi Mandes, MD      . polyethylene glycol (MIRALAX / GLYCOLAX) packet 17 g  17 g Oral Daily PRN Fritzi Mandes, MD          Discharge Medications: Please see discharge summary for a list of discharge medications.  Relevant Imaging Results:  Relevant Lab Results:   Additional Information SSN [id number]  Annamaria Boots, [place]

## 2018-07-15 NOTE — Plan of Care (Signed)
  Problem: Education: Goal: Knowledge of General Education information will improve Description Including pain rating scale, medication(s)/side effects and non-pharmacologic comfort measures Outcome: Progressing   Problem: Clinical Measurements: Goal: Ability to maintain clinical measurements within normal limits will improve Outcome: Progressing Goal: Will remain free from infection Outcome: Progressing Goal: Diagnostic test results will improve Outcome: Progressing Goal: Respiratory complications will improve Outcome: Progressing Goal: Cardiovascular complication will be avoided Outcome: Progressing   Problem: Activity: Goal: Risk for activity intolerance will decrease Outcome: Progressing Note:  P.t. Saw this am  c/o left hip discomfort when up   Problem: Coping: Goal: Level of anxiety will decrease Outcome: Progressing   Problem: Elimination: Goal: Will not experience complications related to bowel motility Outcome: Progressing Goal: Will not experience complications related to urinary retention Outcome: Progressing   Problem: Pain Managment: Goal: General experience of comfort will improve Outcome: Progressing   Problem: Safety: Goal: Ability to remain free from injury will improve Outcome: Progressing   Problem: Skin Integrity: Goal: Risk for impaired skin integrity will decrease Outcome: Progressing   Problem: Education: Goal: Knowledge of disease or condition will improve Outcome: Progressing Goal: Knowledge of secondary prevention will improve Outcome: Progressing Goal: Knowledge of patient specific risk factors addressed and post discharge goals established will improve Outcome: Progressing   Problem: Health Behavior/Discharge Planning: Goal: Ability to manage health-related needs will improve Outcome: Progressing   Problem: Self-Care: Goal: Ability to participate in self-care as condition permits will improve Outcome: Progressing   Problem:  Ischemic Stroke/TIA Tissue Perfusion: Goal: Complications of ischemic stroke/TIA will be minimized Outcome: Progressing

## 2018-07-15 NOTE — Clinical Social Work Placement (Signed)
   CLINICAL SOCIAL WORK PLACEMENT  NOTE  Date:  07/15/2018  Patient Details  Name: Ian Conley MRN: [id number] Date of Birth: 1951-08-01  Clinical Social Work is seeking post-discharge placement for this patient at the [place] level of care (*CSW will initial, date and re-position this form in  chart as items are completed):  Yes   Patient/family provided with [place] Work Department's list of facilities offering this level of care within the geographic area requested by the patient (or if unable, by the patient's family).  Yes   Patient/family informed of their freedom to choose among providers that offer the needed level of care, that participate in Medicare, Medicaid or managed care program needed by the patient, have an available bed and are willing to accept the patient.  Yes   Patient/family informed of [facility]'s ownership interest in [facility] and [facility], as well as of the fact that they are under no obligation to receive care at these facilities.  PASRR submitted to EDS on 07/15/18     PASRR number received on 07/15/18     Existing PASRR number confirmed on       FL2 transmitted to all facilities in geographic area requested by pt/family on 07/15/18     FL2 transmitted to all facilities within larger geographic area on       Patient informed that his/her managed care company has contracts with or will negotiate with certain facilities, including the following:            Patient/family informed of bed offers received.  Patient chooses bed at       Physician recommends and patient chooses bed at      Patient to be transferred to   on  .  Patient to be transferred to facility by       Patient family notified on   of transfer.  Name of family member notified:        PHYSICIAN       Additional Comment:    _______________________________________________ Annamaria Boots, [place] 07/15/2018, 2:36 PM

## 2018-07-15 NOTE — Progress Notes (Signed)
Speech Language Pathology Dysphagia Treatment Patient Details Name: Ian Conley MRN: [id number] DOB: 1951-01-24 Today's Date: 07/15/2018 Time: 7116-5790 SLP Time Calculation (min) (ACUTE ONLY): 25 min  Assessment / Plan / Recommendation Clinical Impression     Pt continues to present with a moderate oral pharyngeal dysphagia as characterized by  Wet vocal quality and delayed cough with thin liquid trials. Pt intake of dysphagia 2 well however refused further trials. Pt has significant left sided lean during meal and had to have support via pillow to sit up at center for swallow. Pt required to use left hand which is weaker and required assistance for intake of solids and liquids. Pt had no cough with dysphagia 2 trials or nectar thick liquid. Pt did have wet vocal quality x 3 with thin liquid trials via cup. Pt able to be cued to cough to clear adequacy however did not attempt to cough independently.  Pt family present, slp educated on diet upgrade to dysphagia 2 with nectar thick liquids and place signs. Family had no questions or concerns at that time.  SLP educated to notify slp and NSG if family ahs questions or concerns.     Diet Recommendation   Dysphagia 2 with Nectar thick liquids.      SLP Plan Continue with current plan of care   Pertinent Vitals/Pain None reported.  Swallowing Goals     General Behavior/Cognition: Alert;Cooperative;Pleasant mood;Confused Patient Positioning: Upright in bed Oral care provided: N/A HPI: Per admitting H&P: Ian Conley  is a 67 y.o. male with a known history of severe alcohol abuse, hypertension, tobacco abuse comes to the emergency room with increasing confusion at home. Mother and brother I in the room not able to give much history however they tell me patient is not his usual self and has been confused lately. They also report patient has not been eating well however he drinks on a daily basis several beers a day.  Oral Cavity - Oral Hygiene     Dysphagia Treatment Treatment Methods: Skilled observation;Upgraded PO texture trial;Patient/caregiver education Patient observed directly with PO's: Yes Type of PO's observed: Dysphagia 2 (chopped);Dysphagia 1 (puree);Nectar-thick liquids;Thin liquids Feeding: Needs assist Liquids provided via: Teaspoon;Cup Oral Phase Signs & Symptoms: Prolonged mastication Pharyngeal Phase Signs & Symptoms: Multiple swallows;Wet vocal quality;Delayed cough Type of cueing: Verbal;Tactile Amount of cueing: Minimal   GO     [place] Sauber 07/15/2018, 2:19 PM

## 2018-07-15 NOTE — Progress Notes (Signed)
*  PRELIMINARY RESULTS* Echocardiogram 2D Echocardiogram has been performed.  Sherrie Sport 07/15/2018, 8:58 AM

## 2018-07-15 NOTE — Evaluation (Signed)
Occupational Therapy Evaluation Patient Details Name: Ian Conley MRN: [id number] DOB: January 27, 1951 Today's Date: 07/15/2018    History of Present Illness Pt. is a 67 y.o. male who was admitted to [facility] with increased confusion. Imaging revealed a 2cm Acute to early subacute ischemic nonhemorrhagic infarct involving the mid cerebellar vermis. Pt. PMHx includes: Severe ETOH abuse, HTN, Tobacco Abuse,    Clinical Impression   Pt. Presents with weakness, 8/10 pain in gluteal/hip region with movement, limited activity tolerance, and limited functional mobility which limits the ability to complete basic ADL and IADL functioning.  Pt. Resides at home with his sons. Per family pt. Was independent with ADLs, and IADLs. Pt. Utilized a pillbox for medication management, and was able to drive. Pt. Has left UE weakness for a previous stroke. Pt. Education was provided about opportunities to encourage, and involve the UEs daily self-care tasks. Functional Mobility for ADL tasks is limited by pain. Pt. would benefit from OT services for ADL training, A/E training, neuromuscular re-ed, and pt. Education about home modification, and DME. Pt. would benefit from SNF level of care upon discharge. Pt. Could benefit from follow-up OT services at discharge.    Follow Up Recommendations  SNF    Equipment Recommendations       Recommendations for Other Services       Precautions / Restrictions Restrictions Weight Bearing Restrictions: No      Mobility Bed Mobility Overal bed mobility: Needs Assistance Bed Mobility: Sit to Supine       Sit to supine: Max assist(secondary to gluteal/Hip pin.)      Transfers                      Balance                                           ADL either performed or assessed with clinical judgement   ADL Overall ADL's : Needs assistance/impaired Eating/Feeding: Set up;Minimal assistance Eating/Feeding Details (indicate cue type and  reason): Has swallowing precautions. Grooming: Minimal assistance   Upper Body Bathing: Set up;Min guard;Bed level   Lower Body Bathing: Set up;Moderate assistance;Bed level(secondary to gluteal/hip pain)   Upper Body Dressing : Minimal assistance;Set up;Bed level   Lower Body Dressing: Set up;Moderate assistance;Bed level(secondary to gluteal/hip pain)               Functional mobility during ADLs: (secondary to gluteal pain)       Vision         Perception     Praxis      Pertinent Vitals/Pain Pain Assessment: 0-10 Pain Score: 8 (with movement ) Pain Location: gluteal/Hip pain Pain Descriptors / Indicators: Aching Pain Intervention(s): Limited activity within patient's tolerance;Monitored during session     Hand Dominance Right   Extremity/Trunk Assessment Upper Extremity Assessment Upper Extremity Assessment: Generalized weakness(Left sided weakness from prior CVA. Intact sensation,a nd propriceptive awareness.)           Communication Communication Communication: No difficulties   Cognition Arousal/Alertness: Awake/alert Behavior During Therapy: WFL for tasks assessed/performed                                       General Comments       Exercises  Shoulder Instructions      Home Living Family/patient expects to be discharged to:: Private residence Living Arrangements: Children(with 2 sons) Available Help at Discharge: Family Type of Home: House Home Access: Stairs to enter Technical brewer of Steps: 2 Entrance Stairs-Rails: None Home Layout: One level     Bathroom Shower/Tub: Walk-in Hydrologist: [place]: None          Prior Functioning/Environment Level of Independence: Independent        Comments: Pt. was independent with ADLs, and IADLS, driving, and  used a pillbox for medication management.        OT Problem List: Decreased strength;Decreased  activity tolerance;Decreased knowledge of use of DME or AE;Pain;Impaired UE functional use;Decreased coordination      OT Treatment/Interventions: Self-care/ADL training;Therapeutic exercise;Patient/family education;DME and/or AE instruction;Therapeutic activities    OT Goals(Current goals can be found in the care plan section) Acute Rehab OT Goals Patient Stated Goal: To return to PLOF OT Goal Formulation: With patient Potential to Achieve Goals: Good  OT Frequency: Min 1X/week   Barriers to D/C:            Co-evaluation              AM-PAC PT "6 Clicks" Daily Activity     Outcome Measure Help from another person eating meals?: A Little Help from another person taking care of personal grooming?: A Little Help from another person toileting, which includes using toliet, bedpan, or urinal?: A Lot Help from another person bathing (including washing, rinsing, drying)?: A Lot Help from another person to put on and taking off regular upper body clothing?: A Little Help from another person to put on and taking off regular lower body clothing?: A Lot 6 Click Score: 15   End of Session    Activity Tolerance: Patient tolerated treatment well Patient left: in bed;with bed alarm set;with family/visitor present  OT Visit Diagnosis: Muscle weakness (generalized) (M62.81);Pain                Time: 1022-1050 OT Time Calculation (min): 28 min Charges:  OT General Charges $OT Visit: 1 Visit OT Evaluation $OT Eval Low Complexity: 1 Low  Harrel Carina, MS, OTR/L  Harrel Carina 07/15/2018, 12:19 PM

## 2018-07-15 NOTE — Progress Notes (Signed)
[facility] [place] NAME: Ian Conley    MR#:  [id number]  DATE OF BIRTH:  [birth year]  SUBJECTIVE:  CHIEF COMPLAINT:   Chief Complaint  Patient presents with  . Altered Mental Status  Patient much improved neurologically this morning, numerous family members at the bedside  REVIEW OF SYSTEMS:  CONSTITUTIONAL: No fever, fatigue or weakness.  EYES: No blurred or double vision.  EARS, NOSE, AND THROAT: No tinnitus or ear pain.  RESPIRATORY: No cough, shortness of breath, wheezing or hemoptysis.  CARDIOVASCULAR: No chest pain, orthopnea, edema.  GASTROINTESTINAL: No nausea, vomiting, diarrhea or abdominal pain.  GENITOURINARY: No dysuria, hematuria.  ENDOCRINE: No polyuria, nocturia,  HEMATOLOGY: No anemia, easy bruising or bleeding SKIN: No rash or lesion. MUSCULOSKELETAL: No joint pain or arthritis.   NEUROLOGIC: No tingling, numbness, weakness.  PSYCHIATRY: No anxiety or depression.   ROS  DRUG ALLERGIES:   Allergies  Allergen Reactions  . Penicillins Rash    Has patient had a PCN reaction causing immediate rash, facial/tongue/throat swelling, SOB or lightheadedness with hypotension: Yes Has patient had a PCN reaction causing severe rash involving mucus membranes or skin necrosis: Unknown Has patient had a PCN reaction that required hospitalization: No Has patient had a PCN reaction occurring within the last 10 years: No If all of the above answers are "NO", then may proceed with Cephalosporin use.    VITALS:  Blood pressure 103/61, pulse 69, temperature 98 F (36.7 C), temperature source Oral, resp. rate 20, height 5\' 10"  (1.778 m), weight 60.4 kg (133 lb 2.5 oz), SpO2 99 %.  PHYSICAL EXAMINATION:  GENERAL:  67 y.o.-year-old patient lying in the bed with no acute distress.  EYES: Pupils equal, round, reactive to light and accommodation. No scleral icterus. Extraocular muscles intact.  HEENT: Head atraumatic, normocephalic.  Oropharynx and nasopharynx clear.  NECK:  Supple, no jugular venous distention. No thyroid enlargement, no tenderness.  LUNGS: Normal breath sounds bilaterally, no wheezing, rales,rhonchi or crepitation. No use of accessory muscles of respiration.  CARDIOVASCULAR: S1, S2 normal. No murmurs, rubs, or gallops.  ABDOMEN: Soft, nontender, nondistended. Bowel sounds present. No organomegaly or mass.  EXTREMITIES: No pedal edema, cyanosis, or clubbing.  NEUROLOGIC: Cranial nerves II through XII are intact. Muscle strength 5/5 in all extremities. Sensation intact. Gait not checked.  PSYCHIATRIC: The patient is alert and oriented x 3.  SKIN: No obvious rash, lesion, or ulcer.   Physical Exam LABORATORY PANEL:   CBC Recent Labs  Lab 07/15/18 0452  WBC 3.8  HGB 13.9  HCT 41.5  PLT 59*   ------------------------------------------------------------------------------------------------------------------  Chemistries  Recent Labs  Lab 07/14/18 0902 07/15/18 0452  NA 133* 132*  K 4.4 3.6  CL 105 104  CO2 20* 22  GLUCOSE 81 108*  BUN 19 13  CREATININE 0.99 0.83  CALCIUM 8.6* 8.4*  MG 2.1  --   AST  --  24  ALT  --  25  ALKPHOS  --  76  BILITOT  --  0.8   ------------------------------------------------------------------------------------------------------------------  Cardiac Enzymes Recent Labs  Lab 07/14/18 1927 07/14/18 2322  TROPONINI <0.03 <0.03   ------------------------------------------------------------------------------------------------------------------  RADIOLOGY:  Mr Brain Wo Contrast  Result Date: 07/14/2018 CLINICAL DATA:  Initial evaluation for acute encephalopathy. Altered mental status. EXAM: MRI HEAD WITHOUT CONTRAST TECHNIQUE: Multiplanar, multiecho pulse sequences of the brain and surrounding structures were obtained without intravenous contrast. COMPARISON:  Prior CT from 07/13/2018 as well as previous MRI from 09/02/2007.  FINDINGS: Brain: Examination  technically limited by motion artifact. Diffuse prominence of the CSF containing spaces compatible generalized age-related cerebral atrophy. Patchy and confluent T2/FLAIR hyperintensity within the periventricular and deep white matter both cerebral hemispheres most compatible chronic small vessel ischemic change, moderate nature. Scatter remote lacunar infarcts present within the bilateral thalami. Linear diffusion abnormality involving the central cerebellar vermis consistent with an acute to early subacute ischemic infarct (series 100, image 21). Area of infarction measures approximately 22 mm in length, extending to involve the roof of the fourth ventricle. No associated hemorrhage or mass effect. No other evidence for acute or subacute ischemia. Gray-white matter differentiation otherwise maintained. No evidence for acute or chronic intracranial hemorrhage. No mass lesion, midline shift or mass effect. No hydrocephalus. No extra-axial fluid collection. Pituitary gland within normal limits. Vascular: Major intracranial vascular flow voids are maintained. Skull and upper cervical spine: Craniocervical junction within normal limits. Upper cervical spine demonstrates no acute abnormality. Degenerative spondylolysis at C3-4 without significant stenosis. No focal marrow replacing lesion. Scalp soft tissues unremarkable. Sinuses/Orbits: Globes and orbital soft tissues demonstrate no acute finding. Scattered mucosal thickening throughout the ethmoidal air cells and maxillary sinuses. No air-fluid levels to suggest acute sinusitis. Mastoids are clear. Inner ear structures normal. Other: None. IMPRESSION: 1. Approximate 2 cm acute to early subacute ischemic nonhemorrhagic infarct involving the mid cerebellar vermis. 2. Generalized age-related cerebral atrophy with moderate chronic small vessel ischemic disease, with remote bilateral thalamic lacunar infarcts. Electronically Signed   By: Jeannine Boga M.D.   On:  07/14/2018 16:20   US Carotid Bilateral (at [facility])  Result Date: 07/14/2018 CLINICAL DATA:  Stroke EXAM: BILATERAL CAROTID DUPLEX ULTRASOUND TECHNIQUE: Pearline Cables scale imaging, color Doppler and duplex ultrasound were performed of bilateral carotid and vertebral arteries in the neck. COMPARISON:  09/03/2007 FINDINGS: Criteria: Quantification of carotid stenosis is based on velocity parameters that correlate the residual internal carotid diameter with NASCET-based stenosis levels, using the diameter of the distal internal carotid lumen as the denominator for stenosis measurement. The following velocity measurements were obtained: RIGHT ICA:  81 cm/sec CCA:  619 cm/sec SYSTOLIC ICA/CCA RATIO:  0.7 DIASTOLIC ICA/CCA RATIO: ECA:  58 cm/sec LEFT ICA:  61 cm/sec CCA:  509 cm/sec SYSTOLIC ICA/CCA RATIO:  0.6 DIASTOLIC ICA/CCA RATIO: ECA:  58 cm/sec RIGHT CAROTID ARTERY: Mild calcified plaque in the bulb. Low resistance internal carotid Doppler pattern is preserved. RIGHT VERTEBRAL ARTERY:  Antegrade. LEFT CAROTID ARTERY: Mild smooth soft plaque in the bulb. Low resistance internal carotid Doppler pattern is preserved. LEFT VERTEBRAL ARTERY:  Antegrade. IMPRESSION: Less than 50% stenosis in the right and left internal carotid arteries. Electronically Signed   By: Marybelle Killings M.D.   On: 07/14/2018 18:57    ASSESSMENT AND PLAN:  JohnnyCarteris a67 y.o.malewith a known history of severe alcohol abuse, hypertension, tobacco abuse comes to the emergency room with increasing confusion at home. Mother and brother I in the room not able to give much history however they tell me patient is not his usual self and has been confused lately. They also report patient has not been eating well however he drinks on a daily basis several beers a day.  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 Management plans discussed with the patient, family and they are in agreement.  CODE STATUS: full  TOTAL TIME TAKING CARE OF THIS PATIENT: 35 minutes.     POSSIBLE D/C IN 1-2 DAYS, DEPENDING ON CLINICAL CONDITION.   Avel Peace Bryor Rami M.D on 07/15/2018   Between 7am to 6pm - Pager - [phone]  After 6pm go to [email or web address] - password [email or web address] [facility]  Office  [phone]  CC: Primary care physician; Lavera Guise, MD  Note: This dictation was prepared with Dragon dictation along with smaller phrase technology. Any transcriptional errors that result from this process are unintentional.

## 2018-07-15 NOTE — Consult Note (Signed)
Referring Physician: Salary    Chief Complaint: Increasing confusion  HPI: Ian Conley is an 67 y.o. male with a history of HTN and stroke in the past with residual left sided weakness who per report of his family has been weak for about a month.  Was eating little.  Complained of "boils" in his mouth.  Has continued to drink ETOH daily until Monday.  Family reports some confusion over the past couple of weeks that has been progressive.  With no improvement was brought to the ED.    Date last known well: Unable to determine Time last known well: Unable to determine tPA Given: No: Unable to determine LKW  Past Medical History:  Diagnosis Date  . Allergy   . Hypertension   . Personal history of tobacco use, presenting hazards to health 03/25/2016  . Polycythemia   . Stroke [facility])    2008 9/11    History reviewed. No pertinent surgical history.  Family History  Problem Relation Age of Onset  . Diabetes Mother   . Cancer Father        Throat   . HIV Brother    Social History:  reports that he has been smoking cigarettes.  He has a 45.00 pack-year smoking history. He has never used smokeless tobacco. He reports that he drinks about 12.6 oz of alcohol per week. He reports that he does not use drugs.  Allergies:  Allergies  Allergen Reactions  . Penicillins Rash    Has patient had a PCN reaction causing immediate rash, facial/tongue/throat swelling, SOB or lightheadedness with hypotension: Yes Has patient had a PCN reaction causing severe rash involving mucus membranes or skin necrosis: Unknown Has patient had a PCN reaction that required hospitalization: No Has patient had a PCN reaction occurring within the last 10 years: No If all of the above answers are "NO", then may proceed with Cephalosporin use.    Medications:  I have reviewed the patient's current medications. Prior to Admission:  Medications Prior to Admission  Medication Sig Dispense Refill Last Dose  .  amLODipine (NORVASC) 10 MG tablet Take 1 tab by po daily (Patient taking differently: Take 10 mg by mouth daily. ) 90 tablet 1 Taking  . aspirin 81 MG tablet Take 81 mg by mouth daily.   Taking  . clopidogrel (PLAVIX) 75 MG tablet Take 1 tablet (75 mg total) by mouth daily. 30 tablet 5 Taking  . metoprolol succinate (TOPROL-XL) 25 MG 24 hr tablet take 1 tablet by mouth once daily for blood pressure (Patient taking differently: Take 25 mg by mouth daily. ) 90 tablet 1 Taking   Scheduled: . atorvastatin  80 mg Oral q1800  . dipyridamole-aspirin  1 capsule Oral BID  . enoxaparin (LOVENOX) injection  40 mg Subcutaneous Q24H  . feeding supplement (ENSURE ENLIVE)  237 mL Oral BID BM    ROS: History obtained from the patient  General ROS: negative for - chills, fatigue, fever, night sweats, weight gain or weight loss Psychological ROS: negative for - behavioral disorder, hallucinations, memory difficulties, mood swings or suicidal ideation Ophthalmic ROS: negative for - blurry vision, double vision, eye pain or loss of vision ENT ROS: poor dentition Allergy and Immunology ROS: negative for - hives or itchy/watery eyes Hematological and Lymphatic ROS: negative for - bleeding problems, bruising or swollen lymph nodes Endocrine ROS: negative for - galactorrhea, hair pattern changes, polydipsia/polyuria or temperature intolerance Respiratory ROS: negative for - cough, hemoptysis, shortness of breath or wheezing  Cardiovascular ROS: negative for - chest pain, dyspnea on exertion, edema or irregular heartbeat Gastrointestinal ROS: vomiting Genito-Urinary ROS: negative for - dysuria, hematuria, incontinence or urinary frequency/urgency Musculoskeletal ROS: negative for - joint swelling or muscular weakness Neurological ROS: as noted in HPI Dermatological ROS: negative for rash and skin lesion changes  Physical Examination: Blood pressure 103/61, pulse 69, temperature 98 F (36.7 C), temperature  source Oral, resp. rate 20, height 5' 10"  (1.778 m), weight 60.4 kg (133 lb 2.5 oz), SpO2 99 %.  HEENT-  Normocephalic, no lesions, without obvious abnormality.  Normal external eye and conjunctiva.  Normal TM's bilaterally.  Normal auditory canals and external ears. Normal external nose, mucus membranes and septum.  Normal pharynx. Cardiovascular- S1, S2 normal, pulses palpable throughout   Lungs- chest clear, no wheezing, rales, normal symmetric air entry Abdomen- soft, non-tender; bowel sounds normal; no masses,  no organomegaly Extremities- no edema Lymph-no adenopathy palpable Musculoskeletal-no joint tenderness, deformity or swelling Skin-warm and dry, no hyperpigmentation, vitiligo, or suspicious lesions  Neurological Examination   Mental Status: Alert.  Did not know he was in the hospital.  Unable to tell me the year.  Confabulates about history leading to hospitalization.  Speech fluent without evidence of aphasia.  Able to follow simple commands without difficulty. Cranial Nerves: II: Discs flat bilaterally; Left visual field loss, pupils equal, round, reactive to light and accommodation III,IV, VI: ptosis not present, extra-ocular motions intact bilaterally V,VII: decrease in left NLF, facial light touch sensation normal bilaterally VIII: hearing normal bilaterally IX,X: gag reflex present XI: bilateral shoulder shrug XII: midline tongue extension Motor: Right : Upper extremity   5/5    Left:     Upper extremity   4/5  Lower extremity   5/5     Lower extremity   4/5 Tone and bulk:normal tone throughout; no atrophy noted Sensory: Pinprick and light touch intact throughout, bilaterally Deep Tendon Reflexes: 2+ and symmetric throughout Plantars: Right: mute   Left: upgoing Cerebellar: Normal finger-to-nose and normal heel-to-shin testing bilaterally Gait: not tested due to safety concerns   Laboratory Studies:  Basic Metabolic Panel: Recent Labs  Lab 07/13/18 0854  07/14/18 0902 07/15/18 0452  NA 130* 133* 132*  K 4.0 4.4 3.6  CL 101 105 104  CO2 19* 20* 22  GLUCOSE 139* 81 108*  BUN 23 19 13   CREATININE 1.38* 0.99 0.83  CALCIUM 8.8* 8.6* 8.4*  MG  --  2.1  --   PHOS  --  3.5  --     Liver Function Tests: Recent Labs  Lab 07/13/18 0854 07/15/18 0452  AST 38 24  ALT 31 25  ALKPHOS 81 76  BILITOT 1.1 0.8  PROT 6.8 5.6*  ALBUMIN 2.9* 2.4*   No results for input(s): LIPASE, AMYLASE in the last 168 hours. Recent Labs  Lab 07/13/18 0855 07/14/18 0902  AMMONIA 11 9    CBC: Recent Labs  Lab 07/13/18 0854 07/15/18 0452  WBC 4.2 3.8  HGB 16.4 13.9  HCT 48.5 41.5  MCV 88.8 89.6  PLT 77* 59*    Cardiac Enzymes: Recent Labs  Lab 07/13/18 0854 07/14/18 1418 07/14/18 1927 07/14/18 2322  TROPONINI <0.03 <0.03 <0.03 <0.03    BNP: Invalid input(s): POCBNP  CBG: No results for input(s): GLUCAP in the last 168 hours.  Microbiology: Results for orders placed or performed during the hospital encounter of 07/13/18  Urine culture     Status: Abnormal   Collection Time: 07/13/18 11:37 AM  Result Value Ref Range Status   Specimen Description   Final    URINE, RANDOM Performed at [facility], [address] [place], [place] [address]    Special Requests   Final    NONE Performed at [facility], [address] [place], [place] [address]    Culture (A)  Final    10,000 COLONIES/mL GROUP B STREP(S.AGALACTIAE)ISOLATED TESTING AGAINST S. AGALACTIAE NOT ROUTINELY PERFORMED DUE TO PREDICTABILITY OF AMP/PEN/VAN SUSCEPTIBILITY. Performed at [facility], [place] [address], [place], [place] [address]    Report Status 07/14/2018 FINAL  Final    Coagulation Studies: No results for input(s): LABPROT, INR in the last 72 hours.  Urinalysis:  Recent Labs  Lab 07/13/18 1137  COLORURINE YELLOW*  LABSPEC 1.015  PHURINE 5.0  GLUCOSEU NEGATIVE  HGBUR SMALL*  BILIRUBINUR NEGATIVE  KETONESUR 5*   PROTEINUR NEGATIVE  NITRITE NEGATIVE  LEUKOCYTESUR NEGATIVE    Lipid Panel:    Component Value Date/Time   CHOL 115 07/15/2018 0452   TRIG 126 07/15/2018 0452   HDL 36 (L) 07/15/2018 0452   CHOLHDL 3.2 07/15/2018 0452   VLDL 25 07/15/2018 0452   LDLCALC 54 07/15/2018 0452    HgbA1C:  Lab Results  Component Value Date   HGBA1C 6.2 (H) 07/15/2018    Urine Drug Screen:  No results found for: LABOPIA, COCAINSCRNUR, LABBENZ, AMPHETMU, THCU, LABBARB  Alcohol Level: No results for input(s): ETH in the last 168 hours.   Imaging: Mr Brain Wo Contrast  Result Date: 07/14/2018 CLINICAL DATA:  Initial evaluation for acute encephalopathy. Altered mental status. EXAM: MRI HEAD WITHOUT CONTRAST TECHNIQUE: Multiplanar, multiecho pulse sequences of the brain and surrounding structures were obtained without intravenous contrast. COMPARISON:  Prior CT from 07/13/2018 as well as previous MRI from 09/02/2007. FINDINGS: Brain: Examination technically limited by motion artifact. Diffuse prominence of the CSF containing spaces compatible generalized age-related cerebral atrophy. Patchy and confluent T2/FLAIR hyperintensity within the periventricular and deep white matter both cerebral hemispheres most compatible chronic small vessel ischemic change, moderate nature. Scatter remote lacunar infarcts present within the bilateral thalami. Linear diffusion abnormality involving the central cerebellar vermis consistent with an acute to early subacute ischemic infarct (series 100, image 21). Area of infarction measures approximately 22 mm in length, extending to involve the roof of the fourth ventricle. No associated hemorrhage or mass effect. No other evidence for acute or subacute ischemia. Gray-white matter differentiation otherwise maintained. No evidence for acute or chronic intracranial hemorrhage. No mass lesion, midline shift or mass effect. No hydrocephalus. No extra-axial fluid collection. Pituitary gland  within normal limits. Vascular: Major intracranial vascular flow voids are maintained. Skull and upper cervical spine: Craniocervical junction within normal limits. Upper cervical spine demonstrates no acute abnormality. Degenerative spondylolysis at C3-4 without significant stenosis. No focal marrow replacing lesion. Scalp soft tissues unremarkable. Sinuses/Orbits: Globes and orbital soft tissues demonstrate no acute finding. Scattered mucosal thickening throughout the ethmoidal air cells and maxillary sinuses. No air-fluid levels to suggest acute sinusitis. Mastoids are clear. Inner ear structures normal. Other: None. IMPRESSION: 1. Approximate 2 cm acute to early subacute ischemic nonhemorrhagic infarct involving the mid cerebellar vermis. 2. Generalized age-related cerebral atrophy with moderate chronic small vessel ischemic disease, with remote bilateral thalamic lacunar infarcts. Electronically Signed   By: Jeannine Boga M.D.   On: 07/14/2018 16:20   US Carotid Bilateral (at [facility])  Result Date: 07/14/2018 CLINICAL DATA:  Stroke EXAM: BILATERAL CAROTID DUPLEX ULTRASOUND TECHNIQUE:  Gray scale imaging, color Doppler and duplex ultrasound were performed of bilateral carotid and vertebral arteries in the neck. COMPARISON:  09/03/2007 FINDINGS: Criteria: Quantification of carotid stenosis is based on velocity parameters that correlate the residual internal carotid diameter with NASCET-based stenosis levels, using the diameter of the distal internal carotid lumen as the denominator for stenosis measurement. The following velocity measurements were obtained: RIGHT ICA:  81 cm/sec CCA:  629 cm/sec SYSTOLIC ICA/CCA RATIO:  0.7 DIASTOLIC ICA/CCA RATIO: ECA:  58 cm/sec LEFT ICA:  61 cm/sec CCA:  476 cm/sec SYSTOLIC ICA/CCA RATIO:  0.6 DIASTOLIC ICA/CCA RATIO: ECA:  58 cm/sec RIGHT CAROTID ARTERY: Mild calcified plaque in the bulb. Low resistance internal carotid Doppler pattern is preserved. RIGHT  VERTEBRAL ARTERY:  Antegrade. LEFT CAROTID ARTERY: Mild smooth soft plaque in the bulb. Low resistance internal carotid Doppler pattern is preserved. LEFT VERTEBRAL ARTERY:  Antegrade. IMPRESSION: Less than 50% stenosis in the right and left internal carotid arteries. Electronically Signed   By: Marybelle Killings M.D.   On: 07/14/2018 18:57    Assessment: 67 y.o. male presenting with altered mental status.  MRI of the brain reviewed and shows an acute mid cerebellar vermis infarct.  Patient with multiple vascular risk factors.  Likely secondary to small vessel disease.  Patient also with acute discontinuation of ETOH consumption recently.  This may be contributing to patient's mental status as well.  Metabolic abnormalities noted on presentation also including AKI and hyponatremia.   Carotid dopplers show no evidence of hemodynamically significant stenosis.  Echocardiogram shows no cardiac source of emboli with an EF of 55-65%.  A1c 6.2, LDL 54.  Stroke Risk Factors - hypertension and smoking  Plan: 1. PT consult, OT consult, Speech consult 2. Prophylactic therapy-Due to low platelets patient unable to start ASA and Plavix at this time 3. NPO until RN stroke swallow screen 4. Telemetry monitoring 5. Frequent neuro checks 6. B12, B1, ESR, TSH, RPR 7. CIWA 8. Smoking cessation counseling   Alexis Goodell, MD Neurology [phone] 07/15/2018, 1:15 PM

## 2018-07-15 NOTE — Clinical Social Work Note (Signed)
Clinical Social Work Assessment  Patient Details  Name: Ian Conley MRN: [id number] Date of Birth: 1951-01-21  Date of referral:  07/15/18               Reason for consult:  Facility Placement                Permission sought to share information with:  Case Manager Permission granted to share information::  Yes, Verbal Permission Granted  Name::      SNF  Agency::   [facility]  Relationship::     Contact Information:     Housing/Transportation Living arrangements for the past 2 months:  Single Family Home Source of Information:  Patient Patient Interpreter Needed:  None Criminal Activity/Legal Involvement Pertinent to Current Situation/Hospitalization:  No - Comment as needed Significant Relationships:  Adult Children, Siblings Lives with:  Self, Adult Children Do you feel safe going back to the place where you live?  Yes Need for family participation in patient care:  Yes (Comment)  Care giving concerns:  Patient lives with his 2 adult children.    Social Worker assessment / plan:  CSW consulted for SNF placement. CSW met with patient and sister Despina Hidden. CSW introduced self and explained role. Patient states that he lives with his 2 sons that both work. Patient's sister states that she assists patient at home as well. Patient has been to SNF in the past and is accepting to going again. Patient's preference would be [facility] or [facility]. CSW explained the bed search process. Patient and family agreed to bed search. CSW will initiate bed search and give bed offers once received. CSW will follow for discharge planning.   Employment status:  Unemployed Nurse, adult PT Recommendations:  [place] / Referral to community resources:  [place]  Patient/Family's Response to care:  Patient thanked CSW for assistance   Patient/Family's Understanding of and Emotional Response to Diagnosis,  Current Treatment, and Prognosis:  Patient and sister are in agreement with SNF and accepting of discharge plan   Emotional Assessment Appearance:  Appears stated age Attitude/Demeanor/Rapport:  Lethargic Affect (typically observed):  Accepting, Pleasant Orientation:  Oriented to Self, Oriented to Place, Oriented to  Time Alcohol / Substance use:  Not Applicable Psych involvement (Current and /or in the community):  No (Comment)  Discharge Needs  Concerns to be addressed:  Discharge Planning Concerns Readmission within the last 30 days:  No Current discharge risk:  None Barriers to Discharge:  Continued Medical Work up   [employer], [place] 07/15/2018, 2:11 PM

## 2018-07-15 NOTE — Progress Notes (Signed)
Family Meeting Note  Advance Directive:yes  Today a meeting took place with the Patient, multiple family members.  Patient is unable to participate due [id number] capacity due to encephalopathy  The following clinical team members were present during this meeting:MD  The following were discussed:Patient's diagnosis: Acute recurrent stroke, alcoholism with chronic hyponatremia/thrombocytopenia, malnutrition, Patient's progosis: Unable to determine and Goals for treatment: Full Code  Additional follow-up to be provided: prn  Time spent during discussion:20 minutes  Gorden Harms, MD

## 2018-07-15 NOTE — Evaluation (Addendum)
Physical Therapy Evaluation Patient Details Name: Ian Conley MRN: [id number] DOB: 01/20/1951 Today's Date: 07/15/2018   History of Present Illness  Pt. is a 67 y.o. male who was admitted to [facility] with increased confusion. Imaging revealed a 2cm Acute to early subacute ischemic nonhemorrhagic infarct involving the mid cerebellar vermis. Pt. PMHx includes: Severe ETOH abuse, HTN, Tobacco Abuse,   Clinical Impression  Upon evaluation, patient alert and oriented to self, general location only; follows simple commands, but requires min cuing for orientation and recall of new information.  Baseline L UE/LE hemiparesis noted, but patient feels weakness, coordination have worsened with this event.  Denies numbness/paresthesia.  Does endorse significant pain to L hip area; worsened with movement and WBing attempts.  Currently requiring min assist for bed mobility; mod assist for sit/stand with RW; min/mod assist for basic transfers and short-distance gait (29') with RW.  Very narrowed BOS with noted decrease in L LE step height/length; heavy R lateral lean at times.  Unsafe/unable to complete without RW and +1 assist from therapist at all times. RN informed/aware of patient reports of pain to L hip; to notify physician if reports persist. Would benefit from skilled PT to address above deficits and promote optimal return to PLOF; recommend transition to STR upon discharge from acute hospitalization.     Follow Up Recommendations SNF    Equipment Recommendations       Recommendations for Other Services       Precautions / Restrictions Precautions Precautions: Fall Restrictions Weight Bearing Restrictions: No      Mobility  Bed Mobility Overal bed mobility: Needs Assistance Bed Mobility: Sit to Supine       Sit to supine: Min assist      Transfers Overall transfer level: Needs assistance Equipment used: Rolling walker (2 wheeled) Transfers: Sit to/from Stand Sit to Stand: Mod  assist         General transfer comment: tends to pull on RW for lift off; excessive weight shift to R LE (reports increased pain to L LE); limited balance reactions, requiring assist from therapist for balance correction and midline orientation  Ambulation/Gait Ambulation/Gait assistance: Min assist;Mod assist Gait Distance (Feet): 30 Feet Assistive device: Rolling walker (2 wheeled)       General Gait Details: 3-point, step to gait pattern with decreased step height/length bilat (L > R), very narrowed (near tandem) BOS with excessive weight shift/trunk lean to R.  Poor dynamic balance; unsafe to attempt without RW and +1 assist.  Stairs            Wheelchair Mobility    Modified Rankin (Stroke Patients Only)       Balance Overall balance assessment: Needs assistance Sitting-balance support: Feet supported;No upper extremity supported Sitting balance-Leahy Scale: Good     Standing balance support: Bilateral upper extremity supported Standing balance-Leahy Scale: Fair                               Pertinent Vitals/Pain Pain Assessment: Faces Pain Score: 8 (with movement ) Faces Pain Scale: Hurts even more Pain Location: L hip/buttocks Pain Descriptors / Indicators: Aching;Grimacing;Guarding Pain Intervention(s): Limited activity within patient's tolerance;Monitored during session;Repositioned    Home Living Family/patient expects to be discharged to:: Private residence Living Arrangements: Children Available Help at Discharge: Family Type of Home: House Home Access: Stairs to enter Entrance Stairs-Rails: None Entrance Stairs-Number of Steps: 2 Home Layout: One level Home Equipment: None  Prior Function Level of Independence: Independent         Comments: Per OT eval, pt. was independent with ADLs, and IADLS, driving, and  used a pillbox for medication management.  Does endorse single fall immediately prior to this event; denies  additional.     Hand Dominance   Dominant Hand: Right    Extremity/Trunk Assessment   Upper Extremity Assessment Upper Extremity Assessment: (R UE grossly 4/5, L LE grossly 3-/5.  Mild/mod coordination deficits (present since CVA in '08))    Lower Extremity Assessment Lower Extremity Assessment: (R LE grossly 4/5 throughout, L LE 3-/5 at hip and knee, 2+/5.  Denies numbness and paresthesia.)       Communication   Communication: No difficulties  Cognition Arousal/Alertness: Awake/alert Behavior During Therapy: WFL for tasks assessed/performed                                   General Comments: poor STM (immediate recall, 3/3; delayed recall 2/3), oriented to self and general location (as hospital) only      General Comments      Exercises     Assessment/Plan    PT Assessment Patient needs continued PT services  PT Problem List Decreased strength;Decreased range of motion;Decreased activity tolerance;Decreased balance;Decreased mobility;Decreased coordination;Decreased cognition;Decreased knowledge of use of DME;Decreased safety awareness       PT Treatment Interventions DME instruction;Gait training;Stair training;Functional mobility training;Therapeutic activities;Therapeutic exercise;Balance training;Cognitive remediation    PT Goals (Current goals can be found in the Care Plan section)  Acute Rehab PT Goals Patient Stated Goal: to take a nap PT Goal Formulation: With patient Time For Goal Achievement: 07/29/18 Potential to Achieve Goals: Fair    Frequency 7X/week   Barriers to discharge        Co-evaluation               AM-PAC PT "6 Clicks" Daily Activity  Outcome Measure Difficulty turning over in bed (including adjusting bedclothes, sheets and blankets)?: Unable Difficulty moving from lying on back to sitting on the side of the bed? : Unable Difficulty sitting down on and standing up from a chair with arms (e.g., wheelchair,  bedside commode, etc,.)?: Unable Help needed moving to and from a bed to chair (including a wheelchair)?: A Lot Help needed walking in hospital room?: A Lot Help needed climbing 3-5 steps with a railing? : A Lot 6 Click Score: 9    End of Session Equipment Utilized During Treatment: Gait belt;Oxygen Activity Tolerance: Patient tolerated treatment well Patient left: in chair;with call bell/phone within reach Nurse Communication: Mobility status PT Visit Diagnosis: Difficulty in walking, not elsewhere classified (R26.2);Muscle weakness (generalized) (M62.81);Hemiplegia and hemiparesis Hemiplegia - Right/Left: Left Hemiplegia - dominant/non-dominant: Non-dominant Hemiplegia - caused by: Cerebral infarction    Time: 2633-3545 PT Time Calculation (min) (ACUTE ONLY): 32 min   Charges:   PT Evaluation $PT Eval Moderate Complexity: 1 Mod PT Treatments $Neuromuscular Re-education: 8-22 mins    Alanta Scobey H. Owens Shark, PT, DPT, NCS 07/15/18, 3:58 PM [phone]

## 2018-07-16 DIAGNOSIS — I10 Essential (primary) hypertension: Secondary | ICD-10-CM | POA: Diagnosis not present

## 2018-07-16 DIAGNOSIS — Y92122 Bedroom in nursing home as the place of occurrence of the external cause: Secondary | ICD-10-CM | POA: Diagnosis not present

## 2018-07-16 DIAGNOSIS — J439 Emphysema, unspecified: Secondary | ICD-10-CM | POA: Diagnosis not present

## 2018-07-16 DIAGNOSIS — Y9389 Activity, other specified: Secondary | ICD-10-CM | POA: Diagnosis not present

## 2018-07-16 DIAGNOSIS — K222 Esophageal obstruction: Secondary | ICD-10-CM | POA: Diagnosis not present

## 2018-07-16 DIAGNOSIS — W19XXXA Unspecified fall, initial encounter: Secondary | ICD-10-CM | POA: Diagnosis not present

## 2018-07-16 DIAGNOSIS — F17210 Nicotine dependence, cigarettes, uncomplicated: Secondary | ICD-10-CM | POA: Diagnosis not present

## 2018-07-16 DIAGNOSIS — Z7401 Bed confinement status: Secondary | ICD-10-CM | POA: Diagnosis not present

## 2018-07-16 DIAGNOSIS — K2970 Gastritis, unspecified, without bleeding: Secondary | ICD-10-CM | POA: Diagnosis not present

## 2018-07-16 DIAGNOSIS — I69054 Hemiplegia and hemiparesis following nontraumatic subarachnoid hemorrhage affecting left non-dominant side: Secondary | ICD-10-CM | POA: Diagnosis not present

## 2018-07-16 DIAGNOSIS — R748 Abnormal levels of other serum enzymes: Secondary | ICD-10-CM | POA: Diagnosis not present

## 2018-07-16 DIAGNOSIS — I69359 Hemiplegia and hemiparesis following cerebral infarction affecting unspecified side: Secondary | ICD-10-CM | POA: Diagnosis not present

## 2018-07-16 DIAGNOSIS — Z79899 Other long term (current) drug therapy: Secondary | ICD-10-CM | POA: Diagnosis not present

## 2018-07-16 DIAGNOSIS — K760 Fatty (change of) liver, not elsewhere classified: Secondary | ICD-10-CM | POA: Diagnosis not present

## 2018-07-16 DIAGNOSIS — N390 Urinary tract infection, site not specified: Secondary | ICD-10-CM | POA: Diagnosis not present

## 2018-07-16 DIAGNOSIS — I700 Atherosclerosis of aorta: Secondary | ICD-10-CM | POA: Diagnosis not present

## 2018-07-16 DIAGNOSIS — I639 Cerebral infarction, unspecified: Secondary | ICD-10-CM | POA: Diagnosis not present

## 2018-07-16 DIAGNOSIS — E785 Hyperlipidemia, unspecified: Secondary | ICD-10-CM | POA: Diagnosis not present

## 2018-07-16 DIAGNOSIS — Z8673 Personal history of transient ischemic attack (TIA), and cerebral infarction without residual deficits: Secondary | ICD-10-CM | POA: Diagnosis not present

## 2018-07-16 DIAGNOSIS — R634 Abnormal weight loss: Secondary | ICD-10-CM | POA: Diagnosis not present

## 2018-07-16 DIAGNOSIS — D751 Secondary polycythemia: Secondary | ICD-10-CM | POA: Diagnosis not present

## 2018-07-16 DIAGNOSIS — R1312 Dysphagia, oropharyngeal phase: Secondary | ICD-10-CM | POA: Diagnosis not present

## 2018-07-16 DIAGNOSIS — R630 Anorexia: Secondary | ICD-10-CM | POA: Diagnosis not present

## 2018-07-16 DIAGNOSIS — K3189 Other diseases of stomach and duodenum: Secondary | ICD-10-CM | POA: Diagnosis not present

## 2018-07-16 DIAGNOSIS — Z23 Encounter for immunization: Secondary | ICD-10-CM | POA: Diagnosis not present

## 2018-07-16 DIAGNOSIS — R197 Diarrhea, unspecified: Secondary | ICD-10-CM | POA: Diagnosis not present

## 2018-07-16 DIAGNOSIS — M6281 Muscle weakness (generalized): Secondary | ICD-10-CM | POA: Diagnosis not present

## 2018-07-16 DIAGNOSIS — W0110XA Fall on same level from slipping, tripping and stumbling with subsequent striking against unspecified object, initial encounter: Secondary | ICD-10-CM | POA: Diagnosis not present

## 2018-07-16 DIAGNOSIS — R531 Weakness: Secondary | ICD-10-CM | POA: Diagnosis not present

## 2018-07-16 DIAGNOSIS — I6901 Attention and concentration deficit following nontraumatic subarachnoid hemorrhage: Secondary | ICD-10-CM | POA: Diagnosis not present

## 2018-07-16 DIAGNOSIS — J432 Centrilobular emphysema: Secondary | ICD-10-CM | POA: Diagnosis not present

## 2018-07-16 DIAGNOSIS — S0101XA Laceration without foreign body of scalp, initial encounter: Secondary | ICD-10-CM | POA: Diagnosis not present

## 2018-07-16 DIAGNOSIS — Y998 Other external cause status: Secondary | ICD-10-CM | POA: Diagnosis not present

## 2018-07-16 DIAGNOSIS — M1611 Unilateral primary osteoarthritis, right hip: Secondary | ICD-10-CM | POA: Diagnosis not present

## 2018-07-16 DIAGNOSIS — I679 Cerebrovascular disease, unspecified: Secondary | ICD-10-CM | POA: Diagnosis not present

## 2018-07-16 DIAGNOSIS — R627 Adult failure to thrive: Secondary | ICD-10-CM | POA: Diagnosis not present

## 2018-07-16 DIAGNOSIS — I6389 Other cerebral infarction: Secondary | ICD-10-CM | POA: Diagnosis not present

## 2018-07-16 DIAGNOSIS — R262 Difficulty in walking, not elsewhere classified: Secondary | ICD-10-CM | POA: Diagnosis not present

## 2018-07-16 DIAGNOSIS — S0990XA Unspecified injury of head, initial encounter: Secondary | ICD-10-CM | POA: Diagnosis not present

## 2018-07-16 DIAGNOSIS — G459 Transient cerebral ischemic attack, unspecified: Secondary | ICD-10-CM | POA: Diagnosis not present

## 2018-07-16 DIAGNOSIS — K219 Gastro-esophageal reflux disease without esophagitis: Secondary | ICD-10-CM | POA: Diagnosis not present

## 2018-07-16 DIAGNOSIS — D696 Thrombocytopenia, unspecified: Secondary | ICD-10-CM | POA: Diagnosis not present

## 2018-07-16 DIAGNOSIS — J438 Other emphysema: Secondary | ICD-10-CM | POA: Diagnosis not present

## 2018-07-16 LAB — RPR: RPR: NONREACTIVE

## 2018-07-16 MED ORDER — ATORVASTATIN CALCIUM 80 MG PO TABS: 80.0000 mg | ORAL_TABLET | Freq: Every day | ORAL | Status: DC

## 2018-07-16 MED ORDER — CEPHALEXIN 500 MG PO CAPS: 500.0000 mg | ORAL_CAPSULE | Freq: Three times a day (TID) | ORAL | 0 refills | Status: AC

## 2018-07-16 MED ORDER — ASPIRIN-DIPYRIDAMOLE ER 25-200 MG PO CP12: 1.0000 | ORAL_CAPSULE | Freq: Two times a day (BID) | ORAL | Status: DC

## 2018-07-16 MED ORDER — CEPHALEXIN 500 MG PO CAPS
500.0000 mg | ORAL_CAPSULE | Freq: Three times a day (TID) | ORAL | Status: DC
  Administered 2018-07-16: 11:00:00 500 mg via ORAL
  Filled 2018-07-16: qty 1

## 2018-07-16 MED ORDER — SODIUM CHLORIDE 0.9 % IV BOLUS
1000.0000 mL | Freq: Once | INTRAVENOUS | Status: AC
  Administered 2018-07-16: 1000 mL via INTRAVENOUS

## 2018-07-16 MED ORDER — SODIUM CHLORIDE 0.9 % IV SOLN
INTRAVENOUS | Status: DC
  Administered 2018-07-16: 05:00:00 via INTRAVENOUS

## 2018-07-16 MED ORDER — POLYETHYLENE GLYCOL 3350 17 G PO PACK: 17.0000 g | PACK | Freq: Every day | ORAL | 0 refills | Status: DC | PRN

## 2018-07-16 NOTE — Progress Notes (Addendum)
Physical Therapy Treatment Patient Details Name: Ian Conley MRN: [id number] DOB: 1951-05-17 Today's Date: 07/16/2018    History of Present Illness Pt. is a 67 y.o. male who was admitted to [facility] with increased confusion. Imaging revealed a 2cm Acute to early subacute ischemic nonhemorrhagic infarct involving the mid cerebellar vermis. Pt. PMHx includes: Severe ETOH abuse, HTN, Tobacco Abuse,     PT Comments    Initial attempt, pt with staff for personal care. Second attempt, Pt agreeable to PT; denies pain. Pt demonstrates poor balance throughout session with heavy lateral lean to the right both in sit and stand. Pt unable to correct by self, but with assist in sit, able to improve closer to midline hold. Unable to correct in stand with progressive heavy lean to R making transfer bed to chair difficult requiring assist of 2. Poor safety awareness throughout. Continue PT to progress balance to improve functional mobility and safety of mobility.    Follow Up Recommendations        Equipment Recommendations       Recommendations for Other Services       Precautions / Restrictions Precautions Precautions: Fall Restrictions Weight Bearing Restrictions: No    Mobility  Bed Mobility Overal bed mobility: Needs Assistance Bed Mobility: Supine to Sit     Supine to sit: Min assist;Mod assist     General bed mobility comments: Min A to rise to sit with increased A needed initially to find/maintain balance with heavy R lateral lean  Transfers Overall transfer level: Needs assistance Equipment used: Rolling walker (2 wheeled) Transfers: Sit to/from Stand Sit to Stand: Mod assist;+2 physical assistance;From elevated surface         General transfer comment: Pt stands intially with Min A of 1 with increasing need of heavy Mod A and Min guard/A of another due to heavy R lateral lean   Ambulation/Gait Ambulation/Gait assistance: Mod assist;+2 safety/equipment Gait Distance  (Feet): 3 Feet Assistive device: Rolling walker (2 wheeled)       General Gait Details: Poor ability to negotiate sequence for where pt wished to go (bed to chair). Heavy lateral R lean requiring heavy Mod A and Min of another. Poor safety awareness and requires consistent cues for sequence, direction, and stand to sit transfer   Stairs             Wheelchair Mobility    Modified Rankin (Stroke Patients Only)       Balance Overall balance assessment: Needs assistance Sitting-balance support: Bilateral upper extremity supported;Feet supported Sitting balance-Leahy Scale: Poor Sitting balance - Comments: R lateral lean   Standing balance support: Bilateral upper extremity supported Standing balance-Leahy Scale: Poor Standing balance comment: heavy R lateral lean                            Cognition Arousal/Alertness: Awake/alert                                            Exercises Other Exercises Other Exercises: sitting balance for righting    General Comments        Pertinent Vitals/Pain Pain Assessment: Faces Faces Pain Scale: Hurts even more Pain Location: (Vague complaint of LLE with independent movement over bed)    Home Living  Prior Function            PT Goals (current goals can now be found in the care plan section) Progress towards PT goals: Progressing toward goals    Frequency    7X/week      PT Plan Current plan remains appropriate    Co-evaluation              AM-PAC PT "6 Clicks" Daily Activity  Outcome Measure  Difficulty turning over in bed (including adjusting bedclothes, sheets and blankets)?: Unable Difficulty moving from lying on back to sitting on the side of the bed? : Unable Difficulty sitting down on and standing up from a chair with arms (e.g., wheelchair, bedside commode, etc,.)?: Unable Help needed moving to and from a bed to chair (including a  wheelchair)?: A Lot Help needed walking in hospital room?: A Lot Help needed climbing 3-5 steps with a railing? : Total 6 Click Score: 8    End of Session Equipment Utilized During Treatment: Gait belt Activity Tolerance: Other (comment)(weakness/balance) Patient left: in chair;with call bell/phone within reach;with chair alarm set;with family/visitor present Nurse Communication: Mobility status;Other (comment)(up in chair and poor safety awareness) PT Visit Diagnosis: Difficulty in walking, not elsewhere classified (R26.2);Muscle weakness (generalized) (M62.81);Hemiplegia and hemiparesis Hemiplegia - Right/Left: Left Hemiplegia - dominant/non-dominant: Non-dominant Hemiplegia - caused by: Cerebral infarction     Time: 4081-4481 PT Time Calculation (min) (ACUTE ONLY): 27 min  Charges:  $Gait Training: 8-22 mins $Neuromuscular Re-education: 8-22 mins                      Larae Grooms, PTA 07/16/2018, 12:10 PM

## 2018-07-16 NOTE — Clinical Social Work Note (Signed)
Patient is medically ready for discharge to [facility] today. CSW notified patient and sister Despina Hidden in room. CSW also notified Claiborne Billings, admissions coordinator at [facility] that patient will DC today. CSW faxed discharge info to [facility]. Patient will be transported by EMS. RN to call report and call for EMS.   [place], [place]

## 2018-07-16 NOTE — Discharge Planning (Addendum)
Patient IV removed.  RN assessment and VS revealed stability for DC to [facility]. Report called and s/w Arline Asp, LPN. Discharge papers signed, explained and educated (placed in packet).  EMS contacted to transport to room 153.  Waiting on arrival.

## 2018-07-16 NOTE — Discharge Summary (Signed)
[facility] NAME: Ian Conley    MR#:  [id number]  DATE OF BIRTH:  1951/03/30  DATE OF ADMISSION:  07/13/2018 ADMITTING PHYSICIAN: Fritzi Mandes, MD  DATE OF DISCHARGE: 07/16/2018  PRIMARY CARE PHYSICIAN: Lavera Guise, MD    ADMISSION DIAGNOSIS:  Altered mental status, unspecified altered mental status type [R41.82]  DISCHARGE DIAGNOSIS:  Active Problems:   Altered mental status   Protein-calorie malnutrition, severe   CVA (cerebral vascular accident) ([place])   SECONDARY DIAGNOSIS:   Past Medical History:  Diagnosis Date  . Allergy   . Hypertension   . Personal history of tobacco use, presenting hazards to health 03/25/2016  . Polycythemia   . Stroke [facility])    2008 9/11    HOSPITAL COURSE:   JohnnyCarteris a67 y.o.malewith a known history of severe alcohol abuse, hypertension, tobacco abuse comes to the emergency room with increasing confusion at home.  1.  Altered mental status/encephalopathy- initially when patient presented to the hospital this was thought to be secondary to alcohol withdrawal, but despite being on treatment for the withdrawal if patient continued to have altered mental status.  He underwent neurological testing including an MRI of the brain which showed a subacute CVA. - Patient's mental status change was secondary to the CVA.  Patient was on aspirin and Plavix prior to coming to the hospital and therefore has been switched over to Aggrenox. -Seen by neurology and they agreed with this management.  Patient's ultrasound of the carotid showed no hemodynamically significant carotid artery stenosis.  Echo cardiogram shows normal ejection fraction with no thrombus noted. - Patient was seen by physical therapy, occupational therapy and also speech.  He is being discharged to a rehab facility and will continue Aggrenox and high-dose intensity statin.  He should have speech therapy continue to follow him to  advance his diet as tolerated.  He is currently on a dysphagia 2 diet with nectar thick liquids.  2.  Alcohol abuse- patient was on CIWA protocol on the hospital.  He has shown no signs of acute alcohol withdrawal presently.  3.  Urinary tract infection- patient's urinalysis was positive, his urine cultures grew minimal strep agalactiae. -Patient is being discharged on 3 days of oral Keflex.  4.  Essential hypertension- patient's blood pressure has been on the low side, his antihypertensives are currently being held and this can be resumed as an outpatient once his blood pressure stabilizes. -Patient was on Norvasc, metoprolol but currently there is being discontinued.  5.  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  No acute bleeding.  This can be further followed as an outpatient and outpatient referral to hematology oncology if needed.  Patient is being discharged to a skilled nursing facility for ongoing care.  Patient's sister and family in agreement with this plan.  DISCHARGE CONDITIONS:   Stable  CONSULTS OBTAINED:  Treatment Team:  Alexis Goodell, MD  DRUG ALLERGIES:   Allergies  Allergen Reactions  . Penicillins Rash    Has patient had a PCN reaction causing immediate rash, facial/tongue/throat swelling, SOB or lightheadedness with hypotension: Yes Has patient had a PCN reaction causing severe rash involving mucus membranes or skin necrosis: Unknown Has patient had a PCN reaction that required hospitalization: No Has patient had a PCN reaction occurring within the last 10 years: No If all of the above answers are "NO", then may proceed with Cephalosporin use.    DISCHARGE MEDICATIONS:   Allergies as of 07/16/2018  Reactions   Penicillins Rash   Has patient had a PCN reaction causing immediate rash, facial/tongue/throat swelling, SOB or lightheadedness with hypotension: Yes Has patient had a PCN reaction causing severe rash involving mucus membranes or  skin necrosis: Unknown Has patient had a PCN reaction that required hospitalization: No Has patient had a PCN reaction occurring within the last 10 years: No If all of the above answers are "NO", then may proceed with Cephalosporin use.      Medication List    STOP taking these medications   amLODipine 10 MG tablet Commonly known as:  NORVASC   aspirin 81 MG tablet   clopidogrel 75 MG tablet Commonly known as:  PLAVIX   metoprolol succinate 25 MG 24 hr tablet Commonly known as:  TOPROL-XL     TAKE these medications   atorvastatin 80 MG tablet Commonly known as:  LIPITOR Take 1 tablet (80 mg total) by mouth daily at 6 PM.   cephALEXin 500 MG capsule Commonly known as:  KEFLEX Take 1 capsule (500 mg total) by mouth every 8 (eight) hours for 3 days.   dipyridamole-aspirin 200-25 MG 12hr capsule Commonly known as:  AGGRENOX Take 1 capsule by mouth 2 (two) times daily.   polyethylene glycol packet Commonly known as:  MIRALAX / GLYCOLAX Take 17 g by mouth daily as needed for mild constipation.         DISCHARGE INSTRUCTIONS:   DIET:  Cardiac diet  Dysphasia 2 diet with nectar thick liquids  DISCHARGE CONDITION:  Stable  ACTIVITY:  Activity as tolerated  OXYGEN:  Home Oxygen: No.   Oxygen Delivery: room air  DISCHARGE LOCATION:  nursing home   If you experience worsening of your admission symptoms, develop shortness of breath, life threatening emergency, suicidal or homicidal thoughts you must seek medical attention immediately by calling 911 or calling your MD immediately  if symptoms less severe.  You Must read complete instructions/literature along with all the possible adverse reactions/side effects for all the Medicines you take and that have been prescribed to you. Take any new Medicines after you have completely understood and accpet all the possible adverse reactions/side effects.   Please note  You were cared for by a hospitalist during your  hospital stay. If you have any questions about your discharge medications or the care you received while you were in the hospital after you are discharged, you can call the unit and asked to speak with the hospitalist on call if the hospitalist that took care of you is not available. Once you are discharged, your primary care physician will handle any further medical issues. Please note that NO REFILLS for any discharge medications will be authorized once you are discharged, as it is imperative that you return to your primary care physician (or establish a relationship with a primary care physician if you do not have one) for your aftercare needs so that they can reassess your need for medications and monitor your lab values.     Today   Mental status much improved.  No acute further events overnight.  Patient sister is at bedside.  Patient tolerating p.o. well.  VITAL SIGNS:  Blood pressure 101/60, pulse 85, temperature 98.6 F (37 C), temperature source Oral, resp. rate 18, height 5\' 10"  (1.778 m), weight 60.4 kg (133 lb 2.5 oz), SpO2 98 %.  I/O:    Intake/Output Summary (Last 24 hours) at 07/16/2018 1203 Last data filed at 07/16/2018 0648 Gross per 24 hour  Intake  1204 ml  Output 1150 ml  Net 54 ml    PHYSICAL EXAMINATION:  GENERAL:  67 y.o.-year-old patient lying in the bed in no acute distress.  EYES: Pupils equal, round, reactive to light and accommodation. No scleral icterus. Extraocular muscles intact.  HEENT: Head atraumatic, normocephalic. Oropharynx and nasopharynx clear.  NECK:  Supple, no jugular venous distention. No thyroid enlargement, no tenderness.  LUNGS: Normal breath sounds bilaterally, no wheezing, rales,rhonchi. No use of accessory muscles of respiration.  CARDIOVASCULAR: S1, S2 normal. No murmurs, rubs, or gallops.  ABDOMEN: Soft, non-tender, non-distended. Bowel sounds present. No organomegaly or mass.  EXTREMITIES: No pedal edema, cyanosis, or clubbing.   NEUROLOGIC: Cranial nerves II through XII are intact. No focal motor or sensory defecits b/l.  PSYCHIATRIC: The patient is alert and oriented x 1. Globally weak.  SKIN: No obvious rash, lesion, or ulcer.   DATA REVIEW:   CBC Recent Labs  Lab 07/15/18 0452  WBC 3.8  HGB 13.9  HCT 41.5  PLT 59*    Chemistries  Recent Labs  Lab 07/14/18 0902 07/15/18 0452  NA 133* 132*  K 4.4 3.6  CL 105 104  CO2 20* 22  GLUCOSE 81 108*  BUN 19 13  CREATININE 0.99 0.83  CALCIUM 8.6* 8.4*  MG 2.1  --   AST  --  24  ALT  --  25  ALKPHOS  --  76  BILITOT  --  0.8    Cardiac Enzymes Recent Labs  Lab 07/14/18 2322  TROPONINI <0.03    Microbiology Results  Results for orders placed or performed during the hospital encounter of 07/13/18  Urine culture     Status: Abnormal   Collection Time: 07/13/18 11:37 AM  Result Value Ref Range Status   Specimen Description   Final    URINE, RANDOM Performed at [facility], [address] [place], [place] [address]    Special Requests   Final    NONE Performed at [facility], [address], [place], [place] [address]    Culture (A)  Final    10,000 COLONIES/mL GROUP B STREP(S.AGALACTIAE)ISOLATED TESTING AGAINST S. AGALACTIAE NOT ROUTINELY PERFORMED DUE TO PREDICTABILITY OF AMP/PEN/VAN SUSCEPTIBILITY. Performed at [facility], [place] [address], [place], [place] [address]    Report Status 07/14/2018 FINAL  Final    RADIOLOGY:  Mr Brain Wo Contrast  Result Date: 07/14/2018 CLINICAL DATA:  Initial evaluation for acute encephalopathy. Altered mental status. EXAM: MRI HEAD WITHOUT CONTRAST TECHNIQUE: Multiplanar, multiecho pulse sequences of the brain and surrounding structures were obtained without intravenous contrast. COMPARISON:  Prior CT from 07/13/2018 as well as previous MRI from 09/02/2007. FINDINGS: Brain: Examination technically limited by motion artifact. Diffuse prominence of the CSF containing spaces  compatible generalized age-related cerebral atrophy. Patchy and confluent T2/FLAIR hyperintensity within the periventricular and deep white matter both cerebral hemispheres most compatible chronic small vessel ischemic change, moderate nature. Scatter remote lacunar infarcts present within the bilateral thalami. Linear diffusion abnormality involving the central cerebellar vermis consistent with an acute to early subacute ischemic infarct (series 100, image 21). Area of infarction measures approximately 22 mm in length, extending to involve the roof of the fourth ventricle. No associated hemorrhage or mass effect. No other evidence for acute or subacute ischemia. Gray-white matter differentiation otherwise maintained. No evidence for acute or chronic intracranial hemorrhage. No mass lesion, midline shift or mass effect. No hydrocephalus. No extra-axial fluid collection. Pituitary gland within normal limits. Vascular:  Major intracranial vascular flow voids are maintained. Skull and upper cervical spine: Craniocervical junction within normal limits. Upper cervical spine demonstrates no acute abnormality. Degenerative spondylolysis at C3-4 without significant stenosis. No focal marrow replacing lesion. Scalp soft tissues unremarkable. Sinuses/Orbits: Globes and orbital soft tissues demonstrate no acute finding. Scattered mucosal thickening throughout the ethmoidal air cells and maxillary sinuses. No air-fluid levels to suggest acute sinusitis. Mastoids are clear. Inner ear structures normal. Other: None. IMPRESSION: 1. Approximate 2 cm acute to early subacute ischemic nonhemorrhagic infarct involving the mid cerebellar vermis. 2. Generalized age-related cerebral atrophy with moderate chronic small vessel ischemic disease, with remote bilateral thalamic lacunar infarcts. Electronically Signed   By: Jeannine Boga M.D.   On: 07/14/2018 16:20   US Carotid Bilateral (at [facility])  Result Date:  07/14/2018 CLINICAL DATA:  Stroke EXAM: BILATERAL CAROTID DUPLEX ULTRASOUND TECHNIQUE: Pearline Cables scale imaging, color Doppler and duplex ultrasound were performed of bilateral carotid and vertebral arteries in the neck. COMPARISON:  09/03/2007 FINDINGS: Criteria: Quantification of carotid stenosis is based on velocity parameters that correlate the residual internal carotid diameter with NASCET-based stenosis levels, using the diameter of the distal internal carotid lumen as the denominator for stenosis measurement. The following velocity measurements were obtained: RIGHT ICA:  81 cm/sec CCA:  086 cm/sec SYSTOLIC ICA/CCA RATIO:  0.7 DIASTOLIC ICA/CCA RATIO: ECA:  58 cm/sec LEFT ICA:  61 cm/sec CCA:  761 cm/sec SYSTOLIC ICA/CCA RATIO:  0.6 DIASTOLIC ICA/CCA RATIO: ECA:  58 cm/sec RIGHT CAROTID ARTERY: Mild calcified plaque in the bulb. Low resistance internal carotid Doppler pattern is preserved. RIGHT VERTEBRAL ARTERY:  Antegrade. LEFT CAROTID ARTERY: Mild smooth soft plaque in the bulb. Low resistance internal carotid Doppler pattern is preserved. LEFT VERTEBRAL ARTERY:  Antegrade. IMPRESSION: Less than 50% stenosis in the right and left internal carotid arteries. Electronically Signed   By: Marybelle Killings M.D.   On: 07/14/2018 18:57      Management plans discussed with the patient, family and they are in agreement.  CODE STATUS:     Code Status Orders  (From admission, onward)        Start     Ordered   07/13/18 1641  Full code  Continuous     07/13/18 1640    Code Status History    This patient has a current code status but no historical code status.      TOTAL TIME TAKING CARE OF THIS PATIENT: 40 minutes.    Henreitta Leber M.D on 07/16/2018 at 12:03 PM  Between 7am to 6pm - Pager - [phone]  After 6pm go to [email or web address] - password [email or web address] [facility]  Office  [phone]  CC: Primary care physician; Lavera Guise, MD

## 2018-07-17 ENCOUNTER — Emergency Department
Admission: EM | Admit: 2018-07-17 | Discharge: 2018-07-18 | Disposition: A | Discharge status: Home / Self Care | Payer: Medicare Other | Attending: Emergency Medicine | Admitting: Emergency Medicine

## 2018-07-17 DIAGNOSIS — Z8673 Personal history of transient ischemic attack (TIA), and cerebral infarction without residual deficits: Secondary | ICD-10-CM | POA: Insufficient documentation

## 2018-07-17 DIAGNOSIS — W0110XA Fall on same level from slipping, tripping and stumbling with subsequent striking against unspecified object, initial encounter: Secondary | ICD-10-CM | POA: Insufficient documentation

## 2018-07-17 DIAGNOSIS — S0990XA Unspecified injury of head, initial encounter: Secondary | ICD-10-CM

## 2018-07-17 DIAGNOSIS — S0101XA Laceration without foreign body of scalp, initial encounter: Secondary | ICD-10-CM | POA: Diagnosis not present

## 2018-07-17 DIAGNOSIS — I10 Essential (primary) hypertension: Secondary | ICD-10-CM | POA: Insufficient documentation

## 2018-07-17 DIAGNOSIS — Y92122 Bedroom in nursing home as the place of occurrence of the external cause: Secondary | ICD-10-CM | POA: Insufficient documentation

## 2018-07-17 DIAGNOSIS — Y9389 Activity, other specified: Secondary | ICD-10-CM | POA: Insufficient documentation

## 2018-07-17 DIAGNOSIS — Z23 Encounter for immunization: Secondary | ICD-10-CM | POA: Diagnosis not present

## 2018-07-17 DIAGNOSIS — W19XXXA Unspecified fall, initial encounter: Secondary | ICD-10-CM

## 2018-07-17 DIAGNOSIS — F17210 Nicotine dependence, cigarettes, uncomplicated: Secondary | ICD-10-CM | POA: Insufficient documentation

## 2018-07-17 DIAGNOSIS — Y998 Other external cause status: Secondary | ICD-10-CM | POA: Insufficient documentation

## 2018-07-17 HISTORY — DX: Anemia, unspecified: D64.9

## 2018-07-17 HISTORY — DX: Acute pancreatitis without necrosis or infection, unspecified: K85.90

## 2018-07-17 LAB — VITAMIN B1: Vitamin B1 (Thiamine): 179.3 nmol/L (ref 66.5–200.0)

## 2018-07-17 MED ORDER — TETANUS-DIPHTH-ACELL PERTUSSIS 5-2.5-18.5 LF-MCG/0.5 IM SUSP
0.5000 mL | Freq: Once | INTRAMUSCULAR | Status: AC
  Administered 2018-07-18: 0.5 mL via INTRAMUSCULAR
  Filled 2018-07-17: qty 0.5

## 2018-07-17 NOTE — ED Triage Notes (Signed)
Patient coming from [facility] for unwitnessed fall. Patient has laceration with steri strips in place to posterior head. Patient denies pain or other complaints. Patient is at facility for physical therapy post CVA a few weeks ago. Patient has left-sided weakness post CVA at baseline.

## 2018-07-17 NOTE — ED Provider Notes (Signed)
[facility] Emergency Department Provider Note   ____________________________________________   First MD Initiated Contact with Patient 07/17/18 2356     (approximate)  I have reviewed the triage vital signs and the nursing notes.   HISTORY  Chief Complaint Fall, laceration   HPI Ian Conley is a 67 y.o. male brought to the ED from skilled nursing facility with a chief complaint of fall.  Patient with a recent history of CVA, on Plavix, recently with unsteady gait as assessed by PT who states he was trying to ambulate to the restroom when he lost his balance and fell, striking the back of his head.  Unsure if he suffered LOC.  Presents with scalp laceration.  Denies headache, vision changes, neck pain, chest pain, shortness of breath, abdominal pain, nausea or vomiting.  Unknown date of last tetanus.   Past Medical History:  Diagnosis Date  . Allergy   . Anemia   . Hypertension   . Pancreatitis   . Personal history of tobacco use, presenting hazards to health 03/25/2016  . Polycythemia   . Stroke [facility])    2008 9/11    Patient Active Problem List   Diagnosis Date Noted  . CVA (cerebral vascular accident) ([place]) 07/15/2018  . Protein-calorie malnutrition, severe 07/14/2018  . Altered mental status 07/13/2018  . Sebaceous cyst 05/17/2018  . Essential hypertension 05/04/2018  . Acute upper respiratory infection 05/04/2018  . Primary osteoarthritis of right hip 05/04/2018  . Cerebrovascular disease 05/04/2018  . Localized superficial swelling, mass, or lump 05/04/2018  . Screening for prostate cancer 05/04/2018  . Polycythemia, secondary 09/05/2016  . Personal history of tobacco use, presenting hazards to health 03/25/2016  . Gastric wall thickening 03/25/2016  . Alkaline phosphatase elevation 03/10/2016    No past surgical history on file.  Prior to Admission medications   Medication Sig Start Date End Date Taking? Authorizing Provider    atorvastatin (LIPITOR) 80 MG tablet Take 1 tablet (80 mg total) by mouth daily at 6 PM. 07/16/18   Sainani, Belia Heman, MD  cephALEXin (KEFLEX) 500 MG capsule Take 1 capsule (500 mg total) by mouth every 8 (eight) hours for 3 days. 07/16/18 07/19/18  Henreitta Leber, MD  dipyridamole-aspirin (AGGRENOX) 200-25 MG 12hr capsule Take 1 capsule by mouth 2 (two) times daily. 07/16/18   Henreitta Leber, MD  polyethylene glycol (MIRALAX / GLYCOLAX) packet Take 17 g by mouth daily as needed for mild constipation. 07/16/18   Henreitta Leber, MD    Allergies Penicillins  Family History  Problem Relation Age of Onset  . Diabetes Mother   . Cancer Father        Throat   . HIV Brother     Social History Social History   Tobacco Use  . Smoking status: Current Every Day Smoker    Packs/day: 1.00    Years: 45.00    Pack years: 45.00    Types: Cigarettes  . Smokeless tobacco: Never Used  Substance Use Topics  . Alcohol use: Yes    Alcohol/week: 12.6 oz    Types: 21 Cans of beer per week  . Drug use: No    Review of Systems  Constitutional: No fever/chills Eyes: No visual changes. ENT: No sore throat. Cardiovascular: Denies chest pain. Respiratory: Denies shortness of breath. Gastrointestinal: No abdominal pain.  No nausea, no vomiting.  No diarrhea.  No constipation. Genitourinary: Negative for dysuria. Musculoskeletal: Negative for back pain. Skin: Positive for scalp laceration.  Negative for rash. Neurological: Negative for headaches, focal weakness or numbness.   ____________________________________________   PHYSICAL EXAM:  VITAL SIGNS: ED Triage Vitals  Enc Vitals Group     BP      Pulse      Resp      Temp      Temp src      SpO2      Weight      Height      Head Circumference      Peak Flow      Pain Score      Pain Loc      Pain Edu?      Excl. in [place]?     Constitutional: Alert and oriented.  Cachectic appearing and in mild acute distress. Eyes: Conjunctivae  are normal. PERRL. EOMI. Head: Approximately 3 cm vertically linear scalp laceration to posterior scalp without active bleeding; well approximated. Nose: No external evidence of injury. Mouth/Throat: Mucous membranes are moist.  Poor dentition.  No dental malocclusion.. Neck: No stridor.  No cervical spine tenderness to palpation. Cardiovascular: Normal rate, regular rhythm. Grossly normal heart sounds.  Good peripheral circulation. Respiratory: Normal respiratory effort.  No retractions. Lungs CTAB. Gastrointestinal: Soft and nontender. No distention. No abdominal bruits. No CVA tenderness. Musculoskeletal: No lower extremity tenderness nor edema.  No joint effusions. Neurologic: Alert and oriented to person and place.  CN II-XII grossly intact.  Normal speech and language. No gross focal neurologic deficits are appreciated.  Skin:  Skin is warm, dry and intact. No rash noted. Psychiatric: Mood and affect are normal. Speech and behavior are normal.  ____________________________________________   LABS (all labs ordered are listed, but only abnormal results are displayed)  Labs Reviewed - No data to display ____________________________________________  EKG  None ____________________________________________  RADIOLOGY  ED MD interpretation: No ICH  Official radiology report(s): Ct Head Wo Contrast  Result Date: 07/18/2018 CLINICAL DATA:  Mechanical fall from bed with posterior scalp laceration. On Plavix. Recent infarct. EXAM: CT HEAD WITHOUT CONTRAST TECHNIQUE: Contiguous axial images were obtained from the base of the skull through the vertex without intravenous contrast. COMPARISON:  Head CT 07/13/2018, brain MRI 07/14/2018 FINDINGS: Brain: No intracranial hemorrhage. Cerebellar vermis infarct on prior MRI is not well seen by CT. Again seen generalized atrophy and chronic small vessel ischemia. No subdural or extra-axial fluid collection. Vascular: No hyperdense vessel or unexpected  calcification. Skull: No fracture.  Unchanged small right parietal exostosis. Sinuses/Orbits: Mild mucosal thickening of paranasal sinuses. Metallic density in the right Carlton Adam, partially included. No acute findings. Other: None. IMPRESSION: 1.  No acute intracranial abnormality.  No skull fracture. 2. No CT correlate for recent cerebellar vermis infarct on MRI. Electronically Signed   By: Jeb Levering M.D.   On: 07/18/2018 00:55    ____________________________________________   PROCEDURES  Procedure(s) performed:     Marland KitchenMarland KitchenLaceration Repair Date/Time: 07/18/2018 1:38 AM Performed by: Paulette Blanch, MD Authorized by: Paulette Blanch, MD   Consent:    Consent obtained:  Verbal   Consent given by:  Patient   Risks discussed:  Infection, pain and poor wound healing Anesthesia (see MAR for exact dosages):    Anesthesia method:  Topical application   Topical anesthesia: PainEase. Laceration details:    Length (cm):  3 Repair type:    Repair type:  Simple Exploration:    Hemostasis achieved with:  Direct pressure   Wound exploration: entire depth of wound probed and visualized  Contaminated: no   Treatment:    Area cleansed with:  Saline   Amount of cleaning:  Standard   Irrigation solution:  Sterile saline   Visualized foreign bodies/material removed: no   Skin repair:    Repair method:  Staples   Number of staples:  4 Approximation:    Approximation:  Loose Post-procedure details:    Patient tolerance of procedure:  Tolerated well, no immediate complications    Critical Care performed: No  ____________________________________________   INITIAL IMPRESSION / ASSESSMENT AND PLAN / ED COURSE  As part of my medical decision making, I reviewed the following data within the [place] notes reviewed and incorporated, Old chart reviewed, Radiograph reviewed and Notes from prior ED visits   67 year old male with a history of CVA, on Plavix, who  presents status post mechanical fall with scalp laceration from nursing home.  Differential diagnosis includes but is not limited to [place], CVA, skull fracture, scalp laceration, etc.  Patient denies pain and declines Tylenol when offered.  Given that patient is on an anticoagulant, will obtain CT head to evaluate for intracranial injury.      ____________________________________________   FINAL CLINICAL IMPRESSION(S) / ED DIAGNOSES  Final diagnoses:  Fall, initial encounter  Laceration of scalp, initial encounter  Minor head injury, initial encounter     ED Discharge Orders    None       Note:  This document was prepared using Dragon voice recognition software and may include unintentional dictation errors.    Paulette Blanch, MD 07/18/18 [phone]

## 2018-07-18 ENCOUNTER — Emergency Department: Payer: Medicare Other

## 2018-07-18 DIAGNOSIS — R262 Difficulty in walking, not elsewhere classified: Secondary | ICD-10-CM | POA: Diagnosis not present

## 2018-07-18 DIAGNOSIS — I69359 Hemiplegia and hemiparesis following cerebral infarction affecting unspecified side: Secondary | ICD-10-CM | POA: Diagnosis not present

## 2018-07-18 DIAGNOSIS — W19XXXA Unspecified fall, initial encounter: Secondary | ICD-10-CM | POA: Diagnosis not present

## 2018-07-18 MED ORDER — PENTAFLUOROPROP-TETRAFLUOROETH EX AERO: INHALATION_SPRAY | CUTANEOUS | Status: DC | PRN

## 2018-07-18 NOTE — Discharge Instructions (Addendum)
1.  Staple removal in 7 to 10 days. 2.  Return to the ER for worsening symptoms, persistent vomiting, difficulty breathing, lethargy or other concerns.

## 2018-07-18 NOTE — ED Notes (Signed)
ED Provider at bedside. 

## 2018-07-18 NOTE — ED Notes (Signed)
Pressure relieving bandage placed on sacrum per family request.

## 2018-07-21 ENCOUNTER — Other Ambulatory Visit: Payer: Medicare Other

## 2018-07-21 ENCOUNTER — Inpatient Hospital Stay (HOSPITAL_BASED_OUTPATIENT_CLINIC_OR_DEPARTMENT_OTHER): Payer: Medicare Other | Admitting: Hematology and Oncology

## 2018-07-21 ENCOUNTER — Encounter: Payer: Self-pay | Admitting: Hematology and Oncology

## 2018-07-21 ENCOUNTER — Inpatient Hospital Stay: Payer: Medicare Other

## 2018-07-21 ENCOUNTER — Ambulatory Visit: Payer: Medicare Other | Admitting: Hematology and Oncology

## 2018-07-21 ENCOUNTER — Inpatient Hospital Stay: Payer: Medicare Other | Attending: Hematology and Oncology

## 2018-07-21 ENCOUNTER — Other Ambulatory Visit: Payer: Self-pay | Admitting: Hematology and Oncology

## 2018-07-21 VITALS — BP 110/62 | HR 83 | Temp 97.8°F | Resp 18 | Ht 70.0 in

## 2018-07-21 DIAGNOSIS — D696 Thrombocytopenia, unspecified: Secondary | ICD-10-CM

## 2018-07-21 DIAGNOSIS — Z79899 Other long term (current) drug therapy: Secondary | ICD-10-CM | POA: Diagnosis not present

## 2018-07-21 DIAGNOSIS — I10 Essential (primary) hypertension: Secondary | ICD-10-CM | POA: Insufficient documentation

## 2018-07-21 DIAGNOSIS — R197 Diarrhea, unspecified: Secondary | ICD-10-CM

## 2018-07-21 DIAGNOSIS — R748 Abnormal levels of other serum enzymes: Secondary | ICD-10-CM | POA: Insufficient documentation

## 2018-07-21 DIAGNOSIS — R531 Weakness: Secondary | ICD-10-CM | POA: Diagnosis not present

## 2018-07-21 DIAGNOSIS — Z8673 Personal history of transient ischemic attack (TIA), and cerebral infarction without residual deficits: Secondary | ICD-10-CM | POA: Diagnosis not present

## 2018-07-21 DIAGNOSIS — F17210 Nicotine dependence, cigarettes, uncomplicated: Secondary | ICD-10-CM

## 2018-07-21 DIAGNOSIS — R634 Abnormal weight loss: Secondary | ICD-10-CM | POA: Insufficient documentation

## 2018-07-21 DIAGNOSIS — D751 Secondary polycythemia: Secondary | ICD-10-CM | POA: Diagnosis not present

## 2018-07-21 LAB — CBC WITH DIFFERENTIAL/PLATELET
BASOS PCT: 1 %
Basophils Absolute: 0.1 10*3/uL (ref 0–0.1)
EOS ABS: 0 10*3/uL (ref 0–0.7)
EOS PCT: 1 %
HCT: 38.8 % — ABNORMAL LOW (ref 40.0–52.0)
HEMOGLOBIN: 12.9 g/dL — AB (ref 13.0–18.0)
LYMPHS ABS: 1 10*3/uL (ref 1.0–3.6)
Lymphocytes Relative: 22 %
MCH: 30 pg (ref 26.0–34.0)
MCHC: 33.3 g/dL (ref 32.0–36.0)
MCV: 90.2 fL (ref 80.0–100.0)
MONO ABS: 0.4 10*3/uL (ref 0.2–1.0)
MONOS PCT: 8 %
NEUTROS PCT: 68 %
Neutro Abs: 3.3 10*3/uL (ref 1.4–6.5)
Platelets: 167 10*3/uL (ref 150–440)
RBC: 4.3 MIL/uL — ABNORMAL LOW (ref 4.40–5.90)
RDW: 14.2 % (ref 11.5–14.5)
WBC: 4.8 10*3/uL (ref 3.8–10.6)

## 2018-07-21 LAB — BASIC METABOLIC PANEL
Anion gap: 11 (ref 5–15)
BUN: 9 mg/dL (ref 8–23)
CO2: 21 mmol/L — ABNORMAL LOW (ref 22–32)
Calcium: 8.9 mg/dL (ref 8.9–10.3)
Chloride: 103 mmol/L (ref 98–111)
Creatinine, Ser: 0.9 mg/dL (ref 0.61–1.24)
GFR calc Af Amer: >60 mL/min (ref >60)
GFR calc non Af Amer: >60 mL/min (ref >60)
Glucose, Bld: 101 mg/dL — ABNORMAL HIGH (ref 70–99)
Potassium: 3.8 mmol/L (ref 3.5–5.1)
Sodium: 135 mmol/L (ref 135–145)

## 2018-07-21 LAB — PROTIME-INR
INR: 1.05
Prothrombin Time: 13.6 seconds (ref 11.4–15.2)

## 2018-07-21 LAB — FOLATE: Folate: 9.3 ng/mL (ref >5.9)

## 2018-07-21 LAB — FERRITIN: Ferritin: 1107 ng/mL — ABNORMAL HIGH (ref 24–336)

## 2018-07-21 LAB — APTT: aPTT: 30 seconds (ref 24–36)

## 2018-07-21 NOTE — Progress Notes (Signed)
[facility] day:  07/21/18   Chief Complaint: Ian Conley is a 67 y.o. 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  HPI:   The patient was last seen in the hematology clinic on 01/17/2018.  At that time, he had ongoing dental issues. He was able to eat.  Weight was up 2 pounds. Exam was stable.  He was smoking less.  Hematocrit was 54.5. He refused phlebotomy.  He was encouraged to follow-up at the [place].  He was encouraged to stop smoking.  He was admitted to [facility] from 07/13/2018 - 07/16/2018 with altered mental status. He was weak, anorexic, and increasingly confused. Head MRI revealed subacute CVA.  Cartoid duplex revealed no significant stenosis.  Echo revealed no thrombus.  He was discharged on Aggrenox.  Urine culture grew minimal strep agalactiae.  He was discharged on 3 days of Keflex.  CBC on 07/15/2018 revealed a hematocrit of 41.5, hemoglobin 13.9, MCV 89.6, platelets 59,000, and WBC 3800.  B12 was 602 and B1 was 179.3 on 07/15/2018.  He was seen in the [facility] ER on 07/17/2018 secondary to a fall and a scalp laceration.  He was noted to be in a skilled nursing facility.  He had an unsteady gait as assessed by PT.  Symptomatically, patient is doing better. He is currently residing at [facility] following his admission for the CVA. He is participating in physical therapy services. Patient is not eating well. He is currently on a pureed diet and notes that he does not like the food.  Patient is scheduled for discharge on 08/06/2018.  Patient is feeling "kind of awkward" lately. He notes that he has been having diffuse abdominal pain and what he describes as "stomach problems". Patient is having loose stools. Patient has a chronic non-productive cough. Patient has developed an area of pressure related skin breakdown to his sacrum.   Patient has not been drinking alcohol for the last month. Prior to that, he  was drinking "them bull beers".  He continues to smoke.    Past Medical History:  Diagnosis Date  . Allergy   . Anemia   . Hypertension   . Pancreatitis   . Personal history of tobacco use, presenting hazards to health 03/25/2016  . Polycythemia   . Stroke [facility])    2008 9/11    History reviewed. No pertinent surgical history.  Family History  Problem Relation Age of Onset  . Diabetes Mother   . Cancer Father        Throat   . HIV Brother     Social History:  reports that he has been smoking cigarettes.  He has a 45.00 pack-year smoking history. He has never used smokeless tobacco. He reports that he drinks about 12.6 oz of alcohol per week. He reports that he does not use drugs.  He started smoking between the age of 7-18.  He smoked 1 pack a day for 48 years.  He is smoking 1 pack a day.  He drinks 2- 24 ounce beers/day.  He was in [employer] for 3 years.  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  He lives in [place].  The patient is accompanied by his 2 sisters today.  Allergies:  Allergies  Allergen Reactions  . Penicillins Rash    Has patient had a PCN reaction causing immediate rash, facial/tongue/throat swelling, SOB or lightheadedness with hypotension:  Yes Has patient had a PCN reaction causing severe rash involving mucus membranes or skin necrosis: Unknown Has patient had a PCN reaction that required hospitalization: No Has patient had a PCN reaction occurring within the last 10 years: No If all of the above answers are "NO", then may proceed with Cephalosporin use.    Current Medications: Current Outpatient Medications  Medication Sig Dispense Refill  . atorvastatin (LIPITOR) 80 MG tablet Take 1 tablet (80 mg total) by mouth daily at 6 PM.    . dipyridamole-aspirin (AGGRENOX) 200-25 MG 12hr capsule Take 1 capsule by mouth 2 (two) times daily.    . polyethylene glycol (MIRALAX / GLYCOLAX) packet Take 17 g by mouth daily as needed for mild  constipation. (Patient not taking: Reported on 07/21/2018) 14 each 0   No current facility-administered medications for this visit.     Review of Systems  Constitutional: Positive for malaise/fatigue. Negative for diaphoresis, fever and weight loss (unknown - refused new weight today).  HENT: Negative.  Negative for nosebleeds and sore throat.        Dental issues causing masticatory issues resulting in weight loss.  Eyes: Negative.   Respiratory: Positive for cough. Negative for hemoptysis, sputum production and shortness of breath.   Cardiovascular: Negative for chest pain, palpitations, orthopnea, leg swelling and PND.  Gastrointestinal: Positive for abdominal pain, diarrhea and nausea. Negative for blood in stool, constipation, melena and vomiting.  Genitourinary: Negative for dysuria, frequency, hematuria and urgency.  Musculoskeletal: Positive for joint pain (hands and feet). Negative for back pain, falls and myalgias.  Skin: Negative for itching and rash.       Pressure related skin breakdown to sacrum  Neurological: Positive for weakness (generalized - working with physical therapy at [facility]). Negative for dizziness, tremors and headaches.       PMH (+) for CVA  Endo/Heme/Allergies: Does not bruise/bleed easily.  Psychiatric/Behavioral: Positive for memory loss. Negative for depression and suicidal ideas. The patient is not nervous/anxious and does not have insomnia.   All other systems reviewed and are negative.  Performance status (ECOG): 2 - Symptomatic, <50% confined to bed  Vital Signs BP 110/62 (BP Location: Right Arm, Patient Position: Sitting)   Pulse 83   Temp 97.8 F (36.6 C) (Tympanic)   Resp 18   Ht 5' 10"  (1.778 m)   SpO2 98%   BMI 19.37 kg/m   Physical Exam  Constitutional: He is oriented to person, place, and time and well-developed, well-nourished, and in no distress.  Thin chronically fatigued appearing gentleman.  HENT:  Head: Normocephalic and atraumatic.   Mouth/Throat: Abnormal dentition (poor dentition).  Gray hair and beard.  Eyes: Pupils are equal, round, and reactive to light. EOM are normal. No scleral icterus.  Glasses. Brown eyes.   Neck: Normal range of motion. Neck supple. No tracheal deviation present. No thyromegaly present.  Cardiovascular: Normal rate, regular rhythm and normal heart sounds. Exam reveals no gallop and no friction rub.  No murmur heard. Pulmonary/Chest: Effort normal and breath sounds normal. No respiratory distress. He has no wheezes. He has no rales.  Abdominal: Soft. Bowel sounds are normal. He exhibits no distension. There is no tenderness.  Musculoskeletal: Normal range of motion. He exhibits no edema or tenderness.  Lymphadenopathy:    He has no cervical adenopathy.    He has no axillary adenopathy.       Right: No inguinal and no supraclavicular adenopathy present.       Left: No inguinal  and no supraclavicular adenopathy present.  Neurological: He is alert and oriented to person, place, and time.  Skin: Skin is warm and dry. No rash noted. No erythema.  Psychiatric: Mood, affect and judgment normal.  Nursing note and vitals reviewed.   Appointment on 07/21/2018  Component Date Value Ref Range Status  . Sodium 07/21/2018 135  135 - 145 mmol/L Final  . Potassium 07/21/2018 3.8  3.5 - 5.1 mmol/L Final  . Chloride 07/21/2018 103  98 - 111 mmol/L Final  . CO2 07/21/2018 21* 22 - 32 mmol/L Final  . Glucose, Bld 07/21/2018 101* 70 - 99 mg/dL Final  . BUN 07/21/2018 9  8 - 23 mg/dL Final  . Creatinine, Ser 07/21/2018 0.90  0.61 - 1.24 mg/dL Final  . Calcium 07/21/2018 8.9  8.9 - 10.3 mg/dL Final  . GFR calc non Af Amer 07/21/2018 >60  >60 mL/min Final  . GFR calc Af Amer 07/21/2018 >60  >60 mL/min Final   Comment: (NOTE) The eGFR has been calculated using the CKD EPI equation. This calculation has not been validated in all clinical situations. eGFR's persistently <60 mL/min signify possible Chronic  Kidney Disease.   Georgiann Hahn gap 07/21/2018 11  5 - 15 Final   Performed at [facility], [address] [place], [place] [address]  . aPTT 07/21/2018 30  24 - 36 seconds Final   Performed at [facility], [address] [place], [place] [address]  . Prothrombin Time 07/21/2018 13.6  11.4 - 15.2 seconds Final  . INR 07/21/2018 1.05   Final   Performed at [facility], [address] [place], [place] [address]  . WBC 07/21/2018 4.8  3.8 - 10.6 K/uL Final  . RBC 07/21/2018 4.30* 4.40 - 5.90 MIL/uL Final  . Hemoglobin 07/21/2018 12.9* 13.0 - 18.0 g/dL Final  . HCT 07/21/2018 38.8* 40.0 - 52.0 % Final  . MCV 07/21/2018 90.2  80.0 - 100.0 fL Final  . MCH 07/21/2018 30.0  26.0 - 34.0 pg Final  . MCHC 07/21/2018 33.3  32.0 - 36.0 g/dL Final  . RDW 07/21/2018 14.2  11.5 - 14.5 % Final  . Platelets 07/21/2018 167  150 - 440 K/uL Final  . Neutrophils Relative % 07/21/2018 68  % Final  . Neutro Abs 07/21/2018 3.3  1.4 - 6.5 K/uL Final  . Lymphocytes Relative 07/21/2018 22  % Final  . Lymphs Abs 07/21/2018 1.0  1.0 - 3.6 K/uL Final  . Monocytes Relative 07/21/2018 8  % Final  . Monocytes Absolute 07/21/2018 0.4  0.2 - 1.0 K/uL Final  . Eosinophils Relative 07/21/2018 1  % Final  . Eosinophils Absolute 07/21/2018 0.0  0 - 0.7 K/uL Final  . Basophils Relative 07/21/2018 1  % Final  . Basophils Absolute 07/21/2018 0.1  0 - 0.1 K/uL Final   Performed at [facility], [address], [place], [place] [address]    Assessment:  Ian Conley is a 67 y.o. 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  He has a 48 pack year smoking history.  He denies any cardiac disease, sleep apnea, or testosterone use.  He had a CVA in 09/01/2007.  Work-up on 03/19/2016 revealed a hematocrit of 57.3, hemoglobin 19.5, MCV 91.5, platelets 144,000, and WBC 5000.  Erythropoietin level was 3.0 (2.6-18.5).  Repeat epo level was 6.8 on 05/05/2016.  JAK2 was negative for  V617F and exon 12.  Ferritin was 181.  Iron studies revealed a  saturation of 41% and a TIBC of 333.  Low dose spiral chest CT scan on 03/25/2016 was negative.  There was notation of a thickened area in the distal stomach or pyloric region.  He has an elevated alkaline phosphatase (139).  PSA was 0.4.  He has never had a colonoscopy.  He undergoes small volume phlebotomy (250-300 cc) if his hematocrit is > 52.  Last phlebotomy was 07/09/2016.  He was admitted to [facility] from 07/13/2018 - 07/16/2018 with altered mental status. Head MRI revealed subacute CVA.  Cartoid duplex revealed no significant stenosis.  Echo revealed no thrombus.  He was discharged on Aggrenox.  Urine culture grew minimal strep agalactiae.  He was discharged on 3 days of Keflex.  Symptomatically, patient is having weakness and memory issues. He was recently discharged to SNF [facility]). Family notes patient is not eating well due to current pureed diet. He is experiencing loose stools. Patient refused weight today in clinic. Exam is grossly unremarkable.  WBC 4800 ([place] 3300).  Hemoglobin 12.9, hematocrit 38.8, platelets 167,000.  Ferritin elevated at 1107.  INR 1.05.  PTT 30.  Folate normal at 9.3.  Plan: 1. Labs today:  CBC with diff, BMP, ferritin, folate, PT, PTT, hepatitis B core antibody total, hepatitis C antibody, HIV antibody, ANA with reflex, GI panel, C.diff 2. Secondary polycythemia - stable  Hematocrit 38.8 today. Goal is to maintain hematocrit < 52 this patient. No therapeutic phlebotomy required.   Additional labs studies added today.  3. Diarrhea - ongoing  Given recent hospitalization and antibiotic use, diarrhea of presumed infectious etiology is of concern. Will check C.diff and GI panel.   No concern for dehydration (BUN 9 and creatinine 0.90) or electrolyte derangement (Na 135, K+ 3.8) at this time. Encouraged to increase fluid intake.  4. Dental issues - ongoing  Patient was scheduled to see  dentistry to discuss full extraction, however he ended up being admitted to the hospital and missed scheduled appointment.   Dental issues resulting in masticatory problems and causing weight loss.   Encouraged to reschedule appointment with dentistry as soon as possible to discuss treatment plans.  5. Post CVA deconditioning - acute  Resides at SNF following admission for subacute CVA. Remains weak and overall deconditioned. He is working with PT/OT/ST at this time. Potentially scheduled for discharge home on 08/06/2018, at which time he will likely require additional therapy and/or home health nursing services. 6. Preventative care needs - ongoing  Patient has never has an EGD or colonoscopy. Given weight loss and diarrhea, will refer to GI Allen Norris, MD) for further evaluation, EGD, and colonoscopy.  7. RTC in 2 weeks for MD assessment and review of labs.    Honor Loh, NP  07/21/2018, 2:47 PM   I saw and evaluated the patient, participating in the key portions of the service and reviewing pertinent diagnostic studies and records.  I reviewed the nurse practitioner's note and agree with the findings and the plan.  The assessment and plan were discussed with the patient.  Numerous questions were asked by the patient and answered.   Nolon Stalls, MD 07/21/2018,2:47 PM

## 2018-07-21 NOTE — Progress Notes (Signed)
No new changes noted today 

## 2018-07-22 LAB — ANA W/REFLEX: Anti Nuclear Antibody(ANA): NEGATIVE

## 2018-07-22 LAB — HEPATITIS B CORE ANTIBODY, TOTAL: Hep B Core Total Ab: NEGATIVE

## 2018-07-22 LAB — HEPATITIS C ANTIBODY: HCV Ab: <0.1 s/co ratio (ref 0.0–0.9)

## 2018-07-22 LAB — HIV ANTIBODY (ROUTINE TESTING W REFLEX): HIV Screen 4th Generation wRfx: NONREACTIVE

## 2018-07-25 ENCOUNTER — Telehealth: Payer: Self-pay | Admitting: *Deleted

## 2018-07-25 NOTE — Telephone Encounter (Signed)
He is welcome to see anyone in that practice. Provide them with an office number and have them call to schedule an appointment. The initial referral has been sent and is active.

## 2018-07-25 NOTE — Telephone Encounter (Signed)
Patient now has an appointment tomorrow morning at 1030 with Dr Juanda Crumble. Ian Conley states she will take him there

## 2018-07-25 NOTE — Telephone Encounter (Addendum)
They told her the soonest he can be seen is mid Sept and sister states Dr Mike Gip wanted him seen in the next 2 weeks, to get that done WE have to call them

## 2018-07-25 NOTE — Telephone Encounter (Addendum)
Patient was told to see Dr Allen Norris in next 2 weeks before returning to see Dr Mike Gip. Dr Allen Norris is [place] and the first available appointment GI can give them is not until mid September. They suggested she contact us to make an appointment for sooner time. Please see if you can make appointment and call sister [name] back [phone]

## 2018-07-25 NOTE — Telephone Encounter (Signed)
Pt has been scheduled for an appt with Dr. [name] on 07/26/18 at 10:30am.

## 2018-07-26 ENCOUNTER — Ambulatory Visit: Payer: Medicare Other | Admitting: Gastroenterology

## 2018-07-26 ENCOUNTER — Encounter: Payer: Self-pay | Admitting: Gastroenterology

## 2018-07-26 VITALS — BP 96/63 | HR 85 | Ht 70.0 in | Wt 124.0 lb

## 2018-07-26 DIAGNOSIS — K3189 Other diseases of stomach and duodenum: Secondary | ICD-10-CM

## 2018-07-26 DIAGNOSIS — R197 Diarrhea, unspecified: Secondary | ICD-10-CM

## 2018-07-26 NOTE — Progress Notes (Signed)
Ian Conley [address]  [place]  [place], [place] [address]  Main: [phone]  Fax: [phone]   Gastroenterology Consultation  Referring Provider:     Lavera Guise, MD Primary Care Physician:  Lavera Guise, MD Primary Gastroenterologist:  Dr. Vonda Conley Reason for Consultation:     Diarrhea        HPI:    Chief Complaint  Patient presents with  . New Patient (Initial Visit)    Oncology referral (B.Gray, NP) for diarrhea; pain in lower abdomin, right side and around to lower back.    Ian Conley is a 67 y.o. y/o male referred for consultation & management  by Dr. Humphrey Rolls, Timoteo Gaul, MD.  Patient has recent history of stroke in July 2019, on antiplatelet therapy, referred to GI for evaluation of colonoscopy and EGD as patient has never had one, in 2017 CT chest showing hypertrophy distal stomach or pyloric region.   Patient has been living in a nursing home since his recent stroke.  He is on a pured diet since then, and family states due to him not liking the consistency of the food, he does not eat much of it.  He they state speech and swallow therapy are working with him on a weekly basis at the nursing home.  They report loose stools for the last week.  Patient reports one bowel movement today that was loose.  Last 2 bowel movements yesterday that were loose.  No blood in stool.  He follows with hematology, and they ordered stool testing for infectious work-up and this has not been done yet.  He denies any heartburn, reports vague right-sided abdominal pain, cramping, nonradiating, intermittent, does not worsen with meals, no weight loss.  No NSAID use.  MiraLAX is listed on his medication list but he is not taking it.  Denies any melena, bright red blood per rectum.  No family history of colon cancer.  Past Medical History:  Diagnosis Date  . Allergy   . Anemia   . Hypertension   . Pancreatitis   . Personal history of tobacco use, presenting  hazards to health 03/25/2016  . Polycythemia   . Stroke [facility])    2008 9/11    No past surgical history on file.  Prior to Admission medications   Medication Sig Start Date End Date Taking? Authorizing Provider  atorvastatin (LIPITOR) 80 MG tablet Take 1 tablet (80 mg total) by mouth daily at 6 PM. 07/16/18  Yes Sainani, Belia Heman, MD  dipyridamole-aspirin (AGGRENOX) 200-25 MG 12hr capsule Take 1 capsule by mouth 2 (two) times daily. 07/16/18  Yes Sainani, Belia Heman, MD  polyethylene glycol (MIRALAX / GLYCOLAX) packet Take 17 g by mouth daily as needed for mild constipation. Patient not taking: Reported on 07/21/2018 07/16/18   Henreitta Leber, MD    Family History  Problem Relation Age of Onset  . Diabetes Mother   . Heart failure Mother   . Cancer Father        Throat   . Stroke Father   . Hypertension Father   . HIV Brother   . Thyroid disease Sister        thyroid cancer  . Hypertension Sister        both sisters     Social History   Tobacco Use  . Smoking status: Current Every Day Smoker    Packs/day: 1.00    Years: 45.00    Pack years: 45.00  Types: Cigarettes  . Smokeless tobacco: Never Used  Substance Use Topics  . Alcohol use: Not Currently    Alcohol/week: 12.6 oz    Types: 21 Cans of beer per week    Comment: when pt was at home  . Drug use: No    Allergies as of 07/26/2018 - Review Complete 07/26/2018  Allergen Reaction Noted  . Penicillins Rash 03/19/2016    Review of Systems:    All systems reviewed and negative except where noted in HPI.   Physical Exam:  BP 96/63   Pulse 85   Ht 5\' 10"  (1.778 m)   Wt 124 lb (56.2 kg)   BMI 17.79 kg/m  No LMP for male patient. Psych:  Alert and cooperative. Normal mood and affect. General:   Alert,  Well-developed, well-nourished, pleasant and cooperative in NAD Head:  Normocephalic and atraumatic. Eyes:  Sclera clear, no icterus.   Conjunctiva pink. Ears:  Normal auditory acuity. Nose:  No deformity,  discharge, or lesions. Mouth:  No deformity or lesions,oropharynx pink & moist. Neck:  Supple; no masses or thyromegaly. Lungs:  Respirations even and unlabored.  Clear throughout to auscultation.   No wheezes, crackles, or rhonchi. No acute distress. Heart:  Regular rate and rhythm; no murmurs, clicks, rubs, or gallops. Abdomen:  Normal bowel sounds.  No bruits.  Soft, non-tender and non-distended without masses, hepatosplenomegaly or hernias noted.  No guarding or rebound tenderness.    Msk:  Symmetrical without gross deformities. Good, equal movement & strength bilaterally. Pulses:  Normal pulses noted. Extremities:  No clubbing or edema.  No cyanosis. Skin:  Intact without significant lesions or rashes. No jaundice. Lymph Nodes:  No significant cervical adenopathy. Psych:  Alert and cooperative. Normal mood and affect.   Labs: CBC    Component Value Date/Time   WBC 4.8 07/21/2018 1306   RBC 4.30 (L) 07/21/2018 1306   HGB 12.9 (L) 07/21/2018 1306   HCT 38.8 (L) 07/21/2018 1306   PLT 167 07/21/2018 1306   MCV 90.2 07/21/2018 1306   MCH 30.0 07/21/2018 1306   MCHC 33.3 07/21/2018 1306   RDW 14.2 07/21/2018 1306   LYMPHSABS 1.0 07/21/2018 1306   MONOABS 0.4 07/21/2018 1306   EOSABS 0.0 07/21/2018 1306   BASOSABS 0.1 07/21/2018 1306   CMP     Component Value Date/Time   NA 135 07/21/2018 1306   K 3.8 07/21/2018 1306   CL 103 07/21/2018 1306   CO2 21 (L) 07/21/2018 1306   GLUCOSE 101 (H) 07/21/2018 1306   BUN 9 07/21/2018 1306   CREATININE 0.90 07/21/2018 1306   CALCIUM 8.9 07/21/2018 1306   PROT 5.6 (L) 07/15/2018 0452   ALBUMIN 2.4 (L) 07/15/2018 0452   AST 24 07/15/2018 0452   ALT 25 07/15/2018 0452   ALKPHOS 76 07/15/2018 0452   BILITOT 0.8 07/15/2018 0452   GFRNONAA >60 07/21/2018 1306   GFRAA >60 07/21/2018 1306    Imaging Studies: Dg Chest 2 View  Result Date: 07/13/2018 CLINICAL DATA:  Failure to thrive EXAM: CHEST - 2 VIEW COMPARISON:  07/29/2017  FINDINGS: Cardiac shadow is within normal limits. The lungs are hyperinflated. Mild scarring is noted in the apices bilaterally similar to that noted on prior CT examination. No focal infiltrate or sizable effusion is seen. No acute bony abnormality is noted. Degenerative changes in the thoracic spine are seen. IMPRESSION: Chronic changes without acute abnormality. Electronically Signed   By: Inez Catalina M.D.   On: 07/13/2018 09:34  Ct Head Wo Contrast  Result Date: 07/18/2018 CLINICAL DATA:  Mechanical fall from bed with posterior scalp laceration. On Plavix. Recent infarct. EXAM: CT HEAD WITHOUT CONTRAST TECHNIQUE: Contiguous axial images were obtained from the base of the skull through the vertex without intravenous contrast. COMPARISON:  Head CT 07/13/2018, brain MRI 07/14/2018 FINDINGS: Brain: No intracranial hemorrhage. Cerebellar vermis infarct on prior MRI is not well seen by CT. Again seen generalized atrophy and chronic small vessel ischemia. No subdural or extra-axial fluid collection. Vascular: No hyperdense vessel or unexpected calcification. Skull: No fracture.  Unchanged small right parietal exostosis. Sinuses/Orbits: Mild mucosal thickening of paranasal sinuses. Metallic density in the right Carlton Adam, partially included. No acute findings. Other: None. IMPRESSION: 1.  No acute intracranial abnormality.  No skull fracture. 2. No CT correlate for recent cerebellar vermis infarct on MRI. Electronically Signed   By: Jeb Levering M.D.   On: 07/18/2018 00:55   Ct Head Wo Contrast  Result Date: 07/13/2018 CLINICAL DATA:  Encephalopathy EXAM: CT HEAD WITHOUT CONTRAST TECHNIQUE: Contiguous axial images were obtained from the base of the skull through the vertex without intravenous contrast. COMPARISON:  MRI head 09/02/2007, CT head 09/02/2007 FINDINGS: Brain: Moderate atrophy with progression since the prior study. Negative for hydrocephalus. Microvascular ischemic changes in the white matter and  right thalamus have progressed significantly since the prior study. No acute infarct, hemorrhage, mass. No midline shift. Vascular: Negative for acute vascular thrombosis. Skull: Negative Sinuses/Orbits: Mild mucosal edema paranasal sinuses.  Normal orbit. Other: None IMPRESSION: Progression of atrophy and chronic microvascular ischemia compared with 2008. No acute abnormality. Electronically Signed   By: Franchot Gallo M.D.   On: 07/13/2018 09:46   Mr Brain Wo Contrast  Result Date: 07/14/2018 CLINICAL DATA:  Initial evaluation for acute encephalopathy. Altered mental status. EXAM: MRI HEAD WITHOUT CONTRAST TECHNIQUE: Multiplanar, multiecho pulse sequences of the brain and surrounding structures were obtained without intravenous contrast. COMPARISON:  Prior CT from 07/13/2018 as well as previous MRI from 09/02/2007. FINDINGS: Brain: Examination technically limited by motion artifact. Diffuse prominence of the CSF containing spaces compatible generalized age-related cerebral atrophy. Patchy and confluent T2/FLAIR hyperintensity within the periventricular and deep white matter both cerebral hemispheres most compatible chronic small vessel ischemic change, moderate nature. Scatter remote lacunar infarcts present within the bilateral thalami. Linear diffusion abnormality involving the central cerebellar vermis consistent with an acute to early subacute ischemic infarct (series 100, image 21). Area of infarction measures approximately 22 mm in length, extending to involve the roof of the fourth ventricle. No associated hemorrhage or mass effect. No other evidence for acute or subacute ischemia. Gray-white matter differentiation otherwise maintained. No evidence for acute or chronic intracranial hemorrhage. No mass lesion, midline shift or mass effect. No hydrocephalus. No extra-axial fluid collection. Pituitary gland within normal limits. Vascular: Major intracranial vascular flow voids are maintained. Skull and  upper cervical spine: Craniocervical junction within normal limits. Upper cervical spine demonstrates no acute abnormality. Degenerative spondylolysis at C3-4 without significant stenosis. No focal marrow replacing lesion. Scalp soft tissues unremarkable. Sinuses/Orbits: Globes and orbital soft tissues demonstrate no acute finding. Scattered mucosal thickening throughout the ethmoidal air cells and maxillary sinuses. No air-fluid levels to suggest acute sinusitis. Mastoids are clear. Inner ear structures normal. Other: None. IMPRESSION: 1. Approximate 2 cm acute to early subacute ischemic nonhemorrhagic infarct involving the mid cerebellar vermis. 2. Generalized age-related cerebral atrophy with moderate chronic small vessel ischemic disease, with remote bilateral thalamic lacunar infarcts. Electronically Signed  By: Jeannine Boga M.D.   On: 07/14/2018 16:20   US Carotid Bilateral (at [facility] Only)  Result Date: 07/14/2018 CLINICAL DATA:  Stroke EXAM: BILATERAL CAROTID DUPLEX ULTRASOUND TECHNIQUE: Pearline Cables scale imaging, color Doppler and duplex ultrasound were performed of bilateral carotid and vertebral arteries in the neck. COMPARISON:  09/03/2007 FINDINGS: Criteria: Quantification of carotid stenosis is based on velocity parameters that correlate the residual internal carotid diameter with NASCET-based stenosis levels, using the diameter of the distal internal carotid lumen as the denominator for stenosis measurement. The following velocity measurements were obtained: RIGHT ICA:  81 cm/sec CCA:  016 cm/sec SYSTOLIC ICA/CCA RATIO:  0.7 DIASTOLIC ICA/CCA RATIO: ECA:  58 cm/sec LEFT ICA:  61 cm/sec CCA:  553 cm/sec SYSTOLIC ICA/CCA RATIO:  0.6 DIASTOLIC ICA/CCA RATIO: ECA:  58 cm/sec RIGHT CAROTID ARTERY: Mild calcified plaque in the bulb. Low resistance internal carotid Doppler pattern is preserved. RIGHT VERTEBRAL ARTERY:  Antegrade. LEFT CAROTID ARTERY: Mild smooth soft plaque in the bulb. Low  resistance internal carotid Doppler pattern is preserved. LEFT VERTEBRAL ARTERY:  Antegrade. IMPRESSION: Less than 50% stenosis in the right and left internal carotid arteries. Electronically Signed   By: Marybelle Killings M.D.   On: 07/14/2018 18:57    Assessment and Plan:   BRAXSON HOLLINGSWORTH is a 67 y.o. y/o male has been referred for evaluation for screening colonoscopy as patient has never had one, also 1 week history of diarrhea, and CT chest in April 2017 reporting hypertrophied distal stomach  Diarrhea Agree with infectious work-up We have given collection tubes for stool testing to the patient's family who accompany him today, and sent written recommendations to have the stools collected were already ordered on August 1 with Dr. Mike Gip If these are negative, can try bulking agent such as Metamucil His diarrhea may be due to him eating foods which sugar, as family states because he does not like his pured food, he mostly relies on ice cream However, given that he needs to maintain nutrition caloric intake, will not stop his ice cream intake at this time  CT chest showing hypertrophied distal stomach in April 2017 August 2018 CT scan reported a normal upper abdomen April 2017 finding may be due to under distention Will obtain upper GI study to rule out peptic ulcer disease in the area EGD at this time given his very recent stroke and need for antiplatelet therapy since then will have higher risks than benefit, we will thus order noninvasive testing first  Patient swallowing function is also likely to improve the further out he is from his stroke Written recommendations sent to his nursing home to consider speech and swallow eval to see if swallowing function has improved, and he can be on a different diet than his pured diet, as that might help him eat better since he does not like the pured consistency  Elective screening colonoscopy can be scheduled at a later time once he is further out  from his stroke to prevent any risks from the procedure, and when it is safe to hold his antiplatelet therapy after discussion with neurology at that time.  In addition, primary care provider can consider alternative colorectal cancer screening methods  Dr Ian Conley

## 2018-07-28 ENCOUNTER — Ambulatory Visit
Admission: RE | Admit: 2018-07-28 | Discharge: 2018-07-28 | Disposition: A | Discharge status: Home / Self Care | Payer: Medicare Other | Source: Ambulatory Visit | Attending: Gastroenterology | Admitting: Gastroenterology

## 2018-07-28 DIAGNOSIS — K3189 Other diseases of stomach and duodenum: Secondary | ICD-10-CM

## 2018-07-28 DIAGNOSIS — K2970 Gastritis, unspecified, without bleeding: Secondary | ICD-10-CM | POA: Diagnosis not present

## 2018-07-28 DIAGNOSIS — K219 Gastro-esophageal reflux disease without esophagitis: Secondary | ICD-10-CM | POA: Insufficient documentation

## 2018-07-29 ENCOUNTER — Telehealth: Payer: Self-pay | Admitting: *Deleted

## 2018-07-29 NOTE — Telephone Encounter (Signed)
Patient's sister came to the [facility] today and left a message with Barbaraann Share at registration. She is concerned because Tevon is at "the facility" and he is not eating. Please advise.       dhs

## 2018-07-29 NOTE — Telephone Encounter (Signed)
Called patient's sister [name] back to inform her that Dr. Mike Gip recommends patient be seen by PCP.  Sister states they have an appointment with GI on Monday.

## 2018-07-29 NOTE — Telephone Encounter (Signed)
  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  He had a recent CVA, has dental issues and diarrhea.  We requested stool studies.  His PCP should see him.  M

## 2018-08-01 ENCOUNTER — Encounter: Payer: Self-pay | Admitting: Gastroenterology

## 2018-08-01 ENCOUNTER — Ambulatory Visit (INDEPENDENT_AMBULATORY_CARE_PROVIDER_SITE_OTHER): Payer: Medicare Other | Admitting: Gastroenterology

## 2018-08-01 VITALS — BP 95/60 | HR 80 | Wt 124.0 lb

## 2018-08-01 DIAGNOSIS — K222 Esophageal obstruction: Secondary | ICD-10-CM

## 2018-08-01 DIAGNOSIS — R630 Anorexia: Secondary | ICD-10-CM

## 2018-08-01 MED ORDER — PANTOPRAZOLE SODIUM 20 MG PO TBEC: 20.0000 mg | DELAYED_RELEASE_TABLET | Freq: Two times a day (BID) | ORAL | 0 refills | Status: DC

## 2018-08-01 NOTE — Progress Notes (Signed)
[name], MD [address]  [place]  [place], [place] [address]  Main: [phone]  Fax: [phone]   Primary Care Physician: Lavera Guise, MD  Primary Gastroenterologist:  Dr. [name]  Chief Complaint  Patient presents with  . Other    pt not wanting to eat or drink (pt states he does not like the food)    HPI: Ian Conley is a 67 y.o. male with recent history of stroke in July 2019, on antiplatelet therapy since then, here for follow-up.  Initially referred for evaluation of colonoscopy and EGD as patient has never had a screening colonoscopy, in 2017 CT chest showing hypertrophy in the distal stomach or pyloric region.  On last visit, he was also having diarrhea, and oncology had ordered stool testing, at that nursing facility did not have done, so we help facilitate this by providing stool collection tubes, but testing still has not been done, and now diarrhea has completely resolved.  Patient is denying any abdominal pain at this time.  History provided by patient and family present during today's evaluation as well.  Main complaint at this time is decreased p.o. intake.  He was initially on a pured diet after his stroke, and now has been on advance diet and tolerating it well, however, family reports he will only have a few bites of whatever he is eating and then stops eating.  Patient denies any dysphagia or odynophagia with swallowing.  Family denies that they see him choking or having difficulty swallowing either.  He denies any heartburn.  No NSAID use.  Denies any melena, blood per rectum.  No family history of colon cancer.  Due to his recent stroke, we had ordered upper GI study after last visit, as EGD would have high risks and benefits given his recent stroke. This reported: ADDENDUM: Not mentioned above:  Mild relative smooth narrowing of the distal esophagus just proximal to the gastroesophageal junction which restricts the passage  of a barium tablet consistent with a mild inflammatory stricture.  Examination of the stomach demonstrated thickened rugal folds and area gastricae as can be seen with mild gastritis. The gastric mucosa appeared unremarkable without evidence of ulceration, scarring, or mass lesion. Gastric motility and emptying was normal. Fluoroscopic examination of the duodenum demonstrates normal motility and morphology without evidence of ulceration or mass Lesion.  Electronically Signed   By: Kathreen Devoid   On: 07/28/2018 15:08  Current Outpatient Medications  Medication Sig Dispense Refill  . atorvastatin (LIPITOR) 80 MG tablet Take 1 tablet (80 mg total) by mouth daily at 6 PM.    . dipyridamole-aspirin (AGGRENOX) 200-25 MG 12hr capsule Take 1 capsule by mouth 2 (two) times daily.    . pantoprazole (PROTONIX) 20 MG tablet Take 1 tablet (20 mg total) by mouth 2 (two) times daily before a meal. 60 tablet 0  . polyethylene glycol (MIRALAX / GLYCOLAX) packet Take 17 g by mouth daily as needed for mild constipation. (Patient not taking: Reported on 07/21/2018) 14 each 0   No current facility-administered medications for this visit.     Allergies as of 08/01/2018 - Review Complete 08/01/2018  Allergen Reaction Noted  . Penicillins Rash 03/19/2016    ROS:  General: Negative for anorexia, weight loss, fever, chills, fatigue, weakness. ENT: Negative for hoarseness, difficulty swallowing , nasal congestion. CV: Negative for chest pain, angina, palpitations, dyspnea on exertion, peripheral edema.  Respiratory: Negative for dyspnea at rest, dyspnea on exertion, cough, sputum,  wheezing.  GI: See history of present illness. GU:  Negative for dysuria, hematuria, urinary incontinence, urinary frequency, nocturnal urination.  Endo: Negative for unusual weight change.    Physical Examination:   BP 95/60   Pulse 80   Wt 124 lb (56.2 kg)   BMI 17.79 kg/m   General: Well-nourished, well-developed in  no acute distress.  Eyes: No icterus. Conjunctivae pink. Mouth: Oropharyngeal mucosa moist and pink , no lesions erythema or exudate. Neck: Supple, Trachea midline Abdomen: Bowel sounds are normal, nontender, nondistended, no hepatosplenomegaly or masses, no abdominal bruits or hernia , no rebound or guarding.   Extremities: No lower extremity edema. No clubbing or deformities. Neuro: Alert and oriented x 3.  Grossly intact. Skin: Warm and dry, no jaundice.   Psych: Alert and cooperative, normal mood and affect.   Labs: CMP     Component Value Date/Time   NA 135 07/21/2018 1306   K 3.8 07/21/2018 1306   CL 103 07/21/2018 1306   CO2 21 (L) 07/21/2018 1306   GLUCOSE 101 (H) 07/21/2018 1306   BUN 9 07/21/2018 1306   CREATININE 0.90 07/21/2018 1306   CALCIUM 8.9 07/21/2018 1306   PROT 5.6 (L) 07/15/2018 0452   ALBUMIN 2.4 (L) 07/15/2018 0452   AST 24 07/15/2018 0452   ALT 25 07/15/2018 0452   ALKPHOS 76 07/15/2018 0452   BILITOT 0.8 07/15/2018 0452   GFRNONAA >60 07/21/2018 1306   GFRAA >60 07/21/2018 1306   Lab Results  Component Value Date   WBC 4.8 07/21/2018   HGB 12.9 (L) 07/21/2018   HCT 38.8 (L) 07/21/2018   MCV 90.2 07/21/2018   PLT 167 07/21/2018    Imaging Studies: Dg Chest 2 View  Result Date: 07/13/2018 CLINICAL DATA:  Failure to thrive EXAM: CHEST - 2 VIEW COMPARISON:  07/29/2017 FINDINGS: Cardiac shadow is within normal limits. The lungs are hyperinflated. Mild scarring is noted in the apices bilaterally similar to that noted on prior CT examination. No focal infiltrate or sizable effusion is seen. No acute bony abnormality is noted. Degenerative changes in the thoracic spine are seen. IMPRESSION: Chronic changes without acute abnormality. Electronically Signed   By: Inez Catalina M.D.   On: 07/13/2018 09:34   Ct Head Wo Contrast  Result Date: 07/18/2018 CLINICAL DATA:  Mechanical fall from bed with posterior scalp laceration. On Plavix. Recent infarct. EXAM:  CT HEAD WITHOUT CONTRAST TECHNIQUE: Contiguous axial images were obtained from the base of the skull through the vertex without intravenous contrast. COMPARISON:  Head CT 07/13/2018, brain MRI 07/14/2018 FINDINGS: Brain: No intracranial hemorrhage. Cerebellar vermis infarct on prior MRI is not well seen by CT. Again seen generalized atrophy and chronic small vessel ischemia. No subdural or extra-axial fluid collection. Vascular: No hyperdense vessel or unexpected calcification. Skull: No fracture.  Unchanged small right parietal exostosis. Sinuses/Orbits: Mild mucosal thickening of paranasal sinuses. Metallic density in the right Carlton Adam, partially included. No acute findings. Other: None. IMPRESSION: 1.  No acute intracranial abnormality.  No skull fracture. 2. No CT correlate for recent cerebellar vermis infarct on MRI. Electronically Signed   By: Jeb Levering M.D.   On: 07/18/2018 00:55   Ct Head Wo Contrast  Result Date: 07/13/2018 CLINICAL DATA:  Encephalopathy EXAM: CT HEAD WITHOUT CONTRAST TECHNIQUE: Contiguous axial images were obtained from the base of the skull through the vertex without intravenous contrast. COMPARISON:  MRI head 09/02/2007, CT head 09/02/2007 FINDINGS: Brain: Moderate atrophy with progression since the prior study. Negative  for hydrocephalus. Microvascular ischemic changes in the white matter and right thalamus have progressed significantly since the prior study. No acute infarct, hemorrhage, mass. No midline shift. Vascular: Negative for acute vascular thrombosis. Skull: Negative Sinuses/Orbits: Mild mucosal edema paranasal sinuses.  Normal orbit. Other: None IMPRESSION: Progression of atrophy and chronic microvascular ischemia compared with 2008. No acute abnormality. Electronically Signed   By: Franchot Gallo M.D.   On: 07/13/2018 09:46   Mr Brain Wo Contrast  Result Date: 07/14/2018 CLINICAL DATA:  Initial evaluation for acute encephalopathy. Altered mental status. EXAM: MRI  HEAD WITHOUT CONTRAST TECHNIQUE: Multiplanar, multiecho pulse sequences of the brain and surrounding structures were obtained without intravenous contrast. COMPARISON:  Prior CT from 07/13/2018 as well as previous MRI from 09/02/2007. FINDINGS: Brain: Examination technically limited by motion artifact. Diffuse prominence of the CSF containing spaces compatible generalized age-related cerebral atrophy. Patchy and confluent T2/FLAIR hyperintensity within the periventricular and deep white matter both cerebral hemispheres most compatible chronic small vessel ischemic change, moderate nature. Scatter remote lacunar infarcts present within the bilateral thalami. Linear diffusion abnormality involving the central cerebellar vermis consistent with an acute to early subacute ischemic infarct (series 100, image 21). Area of infarction measures approximately 22 mm in length, extending to involve the roof of the fourth ventricle. No associated hemorrhage or mass effect. No other evidence for acute or subacute ischemia. Gray-white matter differentiation otherwise maintained. No evidence for acute or chronic intracranial hemorrhage. No mass lesion, midline shift or mass effect. No hydrocephalus. No extra-axial fluid collection. Pituitary gland within normal limits. Vascular: Major intracranial vascular flow voids are maintained. Skull and upper cervical spine: Craniocervical junction within normal limits. Upper cervical spine demonstrates no acute abnormality. Degenerative spondylolysis at C3-4 without significant stenosis. No focal marrow replacing lesion. Scalp soft tissues unremarkable. Sinuses/Orbits: Globes and orbital soft tissues demonstrate no acute finding. Scattered mucosal thickening throughout the ethmoidal air cells and maxillary sinuses. No air-fluid levels to suggest acute sinusitis. Mastoids are clear. Inner ear structures normal. Other: None. IMPRESSION: 1. Approximate 2 cm acute to early subacute ischemic  nonhemorrhagic infarct involving the mid cerebellar vermis. 2. Generalized age-related cerebral atrophy with moderate chronic small vessel ischemic disease, with remote bilateral thalamic lacunar infarcts. Electronically Signed   By: Jeannine Boga M.D.   On: 07/14/2018 16:20   US Carotid Bilateral (at [facility] Only)  Result Date: 07/14/2018 CLINICAL DATA:  Stroke EXAM: BILATERAL CAROTID DUPLEX ULTRASOUND TECHNIQUE: Pearline Cables scale imaging, color Doppler and duplex ultrasound were performed of bilateral carotid and vertebral arteries in the neck. COMPARISON:  09/03/2007 FINDINGS: Criteria: Quantification of carotid stenosis is based on velocity parameters that correlate the residual internal carotid diameter with NASCET-based stenosis levels, using the diameter of the distal internal carotid lumen as the denominator for stenosis measurement. The following velocity measurements were obtained: RIGHT ICA:  81 cm/sec CCA:  176 cm/sec SYSTOLIC ICA/CCA RATIO:  0.7 DIASTOLIC ICA/CCA RATIO: ECA:  58 cm/sec LEFT ICA:  61 cm/sec CCA:  160 cm/sec SYSTOLIC ICA/CCA RATIO:  0.6 DIASTOLIC ICA/CCA RATIO: ECA:  58 cm/sec RIGHT CAROTID ARTERY: Mild calcified plaque in the bulb. Low resistance internal carotid Doppler pattern is preserved. RIGHT VERTEBRAL ARTERY:  Antegrade. LEFT CAROTID ARTERY: Mild smooth soft plaque in the bulb. Low resistance internal carotid Doppler pattern is preserved. LEFT VERTEBRAL ARTERY:  Antegrade. IMPRESSION: Less than 50% stenosis in the right and left internal carotid arteries. Electronically Signed   By: Marybelle Killings M.D.   On: 07/14/2018 18:57  Dg Ugi W/high Density W/o Kub  Addendum Date: 07/28/2018   ADDENDUM REPORT: 07/28/2018 15:08 ADDENDUM: Not mentioned above: Mild relative smooth narrowing of the distal esophagus just proximal to the gastroesophageal junction which restricts the passage of a barium tablet consistent with a mild inflammatory stricture. Electronically Signed   By:  Kathreen Devoid   On: 07/28/2018 15:08   Result Date: 07/28/2018 CLINICAL DATA:  Gastric wall thickening. EXAM: UPPER GI SERIES WITHOUT KUB TECHNIQUE: Routine upper GI series was performed with thin/high density/water soluble barium. FLUOROSCOPY TIME:  Fluoroscopy Time:  1 minutes 36 seconds Radiation Exposure Index (if provided by the fluoroscopic device): 7.7 mGy Number of Acquired Spot Images: 0 COMPARISON:  None. FINDINGS: Examination of the esophagus demonstrated normal esophageal motility. Normal esophageal morphology without evidence of esophagitis or ulceration. No esophageal stricture, diverticula, or mass lesion. No evidence of hiatal hernia. Mild gastroesophageal reflux. Examination of the stomach demonstrated thickened rugal folds and area gastricae as can be seen with mild gastritis. The gastric mucosa appeared unremarkable without evidence of ulceration, scarring, or mass lesion. Gastric motility and emptying was normal. Fluoroscopic examination of the duodenum demonstrates normal motility and morphology without evidence of ulceration or mass lesion. IMPRESSION: 1. Mild gastroesophageal reflux. 2. Findings concerning for mild gastritis. Electronically Signed: By: Kathreen Devoid On: 07/28/2018 14:49    Assessment and Plan:   Ian Conley is a 67 y.o. y/o male with recent stroke in July 2019, on Plavix since then, here for follow-up, with diarrhea resolved, abdominal pain resolved, but decreased appetite and p.o. Intake  No dysphagia, odynophagia Upper GI study reporting mild relatives with narrowing of the distal esophagus and possible gastritis Given recent stroke, and use of Plavix, EGD with dilation at this time would have high risks and benefits In addition, holding his Plavix this close to his recent stroke, will also likely have higher risks and benefits, and dilation on Plavix can lead to bleeding during the procedure  Since patient is denying any symptoms of dysphagia, will evaluate  with CT chest and abdomen to rule out esophageal mass, and evaluate for any underlying strictures  We will also treat with PPI twice daily Protonix ordered as it does not interact with his Plavix (Risks of PPI use were discussed with patient including bone loss, C. Diff diarrhea, pneumonia, infections, CKD, electrolyte abnormalities.  If clinically possible based on symptoms, goal would be to maintain patient on the lowest dose possible, or discontinue the medication with institution of acid reflux lifestyle modifications over time. Pt. Verbalizes understanding and chooses to continue the medication.)  If CT shows concerning findings, will need to obtain clearance from neurology to see if his Plavix can be held safely, versus just do a diagnostic study without dilation  PPI should also help with his reported gastritis on upper GI study  He is tolerating solid foods without difficulty He may not be eating as much as he is not getting the foods that he likes to eat He stated if he gets fish that his sister cooks well, he may eat the whole plates  I have therefore recommended nutrition lactose-free supplements such as Ensure or boost to help maintain his caloric intake until his oral intake is able to maintain his nutrition  Continue follow-up in clinic Follow-up with primary care provider closely  Dr [name]

## 2018-08-02 ENCOUNTER — Ambulatory Visit: Payer: Medicare Other | Admitting: Adult Health

## 2018-08-02 ENCOUNTER — Telehealth: Payer: Self-pay

## 2018-08-02 ENCOUNTER — Encounter: Payer: Self-pay | Admitting: Adult Health

## 2018-08-02 VITALS — BP 98/42 | HR 82 | Resp 16 | Ht 70.0 in | Wt 123.6 lb

## 2018-08-02 DIAGNOSIS — K219 Gastro-esophageal reflux disease without esophagitis: Secondary | ICD-10-CM | POA: Diagnosis not present

## 2018-08-02 DIAGNOSIS — I679 Cerebrovascular disease, unspecified: Secondary | ICD-10-CM | POA: Diagnosis not present

## 2018-08-02 DIAGNOSIS — I10 Essential (primary) hypertension: Secondary | ICD-10-CM | POA: Diagnosis not present

## 2018-08-02 MED ORDER — PANTOPRAZOLE SODIUM 20 MG PO TBEC: 20.0000 mg | DELAYED_RELEASE_TABLET | Freq: Two times a day (BID) | ORAL | 0 refills | Status: DC

## 2018-08-02 NOTE — Patient Instructions (Signed)

## 2018-08-02 NOTE — Telephone Encounter (Signed)
Pt's sister, [name] notified of CT chest and abdomin with contrast ordered for Aug 20th, at 8:30am at the [facility] location. Arrival time is 8:15 am. His sister [name] will pick up the contrast with instructions on Friday before CT.  Pt is to be NPO 4 hours prior to procedure. I also notified the RN case manager at [facility] of the same.

## 2018-08-02 NOTE — Addendum Note (Signed)
Addended by: Earl Lagos on: 08/02/2018 09:55 AM   Modules accepted: Orders

## 2018-08-02 NOTE — Progress Notes (Signed)
[facility] [place], [place] [address]  Internal MEDICINE  Office Visit Note  Patient Name: Ian Conley  [id number]  [id number]  Date of Service: 08/16/2018  Chief Complaint  Patient presents with  . Cerebrovascular Accident    follow up     HPI  Pt here for follow.  His most recent hospital admission on 07/13/2018 for CVA.  He was discharged to [facility] for rehab.  He reports he is walking on his own, but his appetite is decreased.   He has been off his acid reflux medication since hospitalization and he would like to restart that. He reports left sided residual weakness from a approximately 10 years ago.  He denies that this current infarct has increased that weakness.   Current Medication: Outpatient Encounter Medications as of 08/02/2018  Medication Sig  . atorvastatin (LIPITOR) 80 MG tablet Take 1 tablet (80 mg total) by mouth daily at 6 PM.  . dipyridamole-aspirin (AGGRENOX) 200-25 MG 12hr capsule Take 1 capsule by mouth 2 (two) times daily.  . Multiple Vitamins-Minerals (YOUR LIFE MULTI MENS 50+) TABS Take by mouth.  . polyethylene glycol (MIRALAX / GLYCOLAX) packet Take 17 g by mouth daily as needed for mild constipation. (Patient not taking: Reported on 08/08/2018)  . pantoprazole (PROTONIX) 20 MG tablet Take 1 tablet (20 mg total) by mouth 2 (two) times daily before a meal.  . [DISCONTINUED] pantoprazole (PROTONIX) 20 MG tablet Take 1 tablet (20 mg total) by mouth 2 (two) times daily before a meal. (Patient not taking: Reported on 08/02/2018)   No facility-administered encounter medications on file as of 08/02/2018.     Surgical History: History reviewed. No pertinent surgical history.  Medical History: Past Medical History:  Diagnosis Date  . Allergy   . Anemia   . Hypertension   . Pancreatitis   . Personal history of tobacco use, presenting hazards to health 03/25/2016  . Polycythemia   . Stroke [facility])    2008 9/11     Family History: Family History  Problem Relation Age of Onset  . Diabetes Mother   . Heart failure Mother   . Cancer Father        Throat   . Stroke Father   . Hypertension Father   . HIV Brother   . Thyroid disease Sister        thyroid cancer  . Hypertension Sister        both sisters    Social History   Socioeconomic History  . Marital status: Widowed    Spouse name: Not on file  . Number of children: Not on file  . Years of education: Not on file  . Highest education level: Not on file  Occupational History  . Not on file  Social Needs  . Financial resource strain: Not on file  . Food insecurity:    Worry: Not on file    Inability: Not on file  . Transportation needs:    Medical: Not on file    Non-medical: Not on file  Tobacco Use  . Smoking status: Current Every Day Smoker    Packs/day: 1.00    Years: 45.00    Pack years: 45.00    Types: Cigarettes  . Smokeless tobacco: Never Used  Substance and Sexual Activity  . Alcohol use: Not Currently    Alcohol/week: 21.0 standard drinks    Types: 21 Cans of beer per week    Comment: when pt was at  home  . Drug use: No  . Sexual activity: Not on file  Lifestyle  . Physical activity:    Days per week: Not on file    Minutes per session: Not on file  . Stress: Not on file  Relationships  . Social connections:    Talks on phone: Not on file    Gets together: Not on file    Attends religious service: Not on file    Active member of club or organization: Not on file    Attends meetings of clubs or organizations: Not on file    Relationship status: Not on file  . Intimate partner violence:    Fear of current or ex partner: Not on file    Emotionally abused: Not on file    Physically abused: Not on file    Forced sexual activity: Not on file  Other Topics Concern  . Not on file  Social History Narrative  . Not on file    Review of Systems  Constitutional: Positive for appetite change. Negative for  chills, fatigue and unexpected weight change.  HENT: Negative.  Negative for congestion, rhinorrhea, sneezing and sore throat.   Eyes: Negative for redness.  Respiratory: Negative.  Negative for cough, chest tightness and shortness of breath.   Cardiovascular: Negative.  Negative for chest pain and palpitations.  Gastrointestinal: Negative.  Negative for abdominal pain, constipation, diarrhea, nausea and vomiting.  Endocrine: Negative.   Genitourinary: Negative.  Negative for dysuria and frequency.  Musculoskeletal: Positive for gait problem. Negative for arthralgias, back pain, joint swelling and neck pain.  Skin: Negative.  Negative for rash.  Allergic/Immunologic: Negative.   Neurological: Positive for weakness. Negative for tremors, facial asymmetry, speech difficulty and numbness.  Hematological: Negative for adenopathy. Does not bruise/bleed easily.  Psychiatric/Behavioral: Negative.  Negative for behavioral problems, sleep disturbance and suicidal ideas. The patient is not nervous/anxious.     Vital Signs: BP (!) 98/42   Pulse 82   Resp 16   Ht 5\' 10"  (1.778 m)   Wt 123 lb 9.6 oz (56.1 kg)   SpO2 98%   BMI 17.73 kg/m   Physical Exam  Constitutional: He is oriented to person, place, and time. He appears well-developed and well-nourished. No distress.  HENT:  Head: Normocephalic and atraumatic.  Mouth/Throat: Oropharynx is clear and moist. No oropharyngeal exudate.  Eyes: Pupils are equal, round, and reactive to light. EOM are normal.  Neck: Normal range of motion. Neck supple. No JVD present. No tracheal deviation present. No thyromegaly present.  Cardiovascular: Normal rate, regular rhythm and normal heart sounds. Exam reveals no gallop and no friction rub.  No murmur heard. Pulmonary/Chest: Effort normal and breath sounds normal. No respiratory distress. He has no wheezes. He has no rales. He exhibits no tenderness.  Abdominal: Soft. There is no tenderness. There is no  guarding.  Musculoskeletal: Normal range of motion.  Lymphadenopathy:    He has no cervical adenopathy.  Neurological: He is alert and oriented to person, place, and time. No cranial nerve deficit.  Skin: Skin is warm and dry. He is not diaphoretic.  Psychiatric: He has a normal mood and affect. His behavior is normal. Judgment and thought content normal.  Nursing note and vitals reviewed.  Assessment/Plan:  1. Gastroesophageal reflux disease without esophagitis Restart Protonix as directed for acid reflux.  - pantoprazole (PROTONIX) 20 MG tablet; Take 1 tablet (20 mg total) by mouth 2 (two) times daily before a meal.  Dispense: 60 tablet;  Refill: 0  2. Cerebrovascular disease Pt being followed by Neurology.    3. Essential hypertension Resolved at this time since hospital admission.  Pt is on no medications for BP.    General Counseling: Cuong verbalizes understanding of the findings of todays visit and agrees with plan of treatment. I have discussed any further diagnostic evaluation that may be needed or ordered today. We also reviewed his medications today. he has been encouraged to call the office with any questions or concerns that should arise related to todays visit.   Meds ordered this encounter  Medications  . pantoprazole (PROTONIX) 20 MG tablet    Sig: Take 1 tablet (20 mg total) by mouth 2 (two) times daily before a meal.    Dispense:  60 tablet    Refill:  0    Time spent: 25 Minutes   This patient was seen by Orson Gear AGNP-C in Collaboration with Dr Lavera Guise as a part of collaborative care agreement    Dr Lavera Guise Internal medicine

## 2018-08-08 ENCOUNTER — Inpatient Hospital Stay (HOSPITAL_BASED_OUTPATIENT_CLINIC_OR_DEPARTMENT_OTHER): Payer: Medicare Other | Admitting: Hematology and Oncology

## 2018-08-08 ENCOUNTER — Encounter: Payer: Self-pay | Admitting: Hematology and Oncology

## 2018-08-08 VITALS — BP 104/66 | HR 73 | Temp 97.0°F | Resp 18 | Ht 70.0 in | Wt 128.6 lb

## 2018-08-08 DIAGNOSIS — Z79899 Other long term (current) drug therapy: Secondary | ICD-10-CM | POA: Diagnosis not present

## 2018-08-08 DIAGNOSIS — I10 Essential (primary) hypertension: Secondary | ICD-10-CM | POA: Diagnosis not present

## 2018-08-08 DIAGNOSIS — K3189 Other diseases of stomach and duodenum: Secondary | ICD-10-CM

## 2018-08-08 DIAGNOSIS — R197 Diarrhea, unspecified: Secondary | ICD-10-CM

## 2018-08-08 DIAGNOSIS — R634 Abnormal weight loss: Secondary | ICD-10-CM | POA: Diagnosis not present

## 2018-08-08 DIAGNOSIS — R531 Weakness: Secondary | ICD-10-CM | POA: Diagnosis not present

## 2018-08-08 DIAGNOSIS — R748 Abnormal levels of other serum enzymes: Secondary | ICD-10-CM | POA: Diagnosis not present

## 2018-08-08 DIAGNOSIS — Z8673 Personal history of transient ischemic attack (TIA), and cerebral infarction without residual deficits: Secondary | ICD-10-CM | POA: Diagnosis not present

## 2018-08-08 DIAGNOSIS — K229 Disease of esophagus, unspecified: Secondary | ICD-10-CM | POA: Insufficient documentation

## 2018-08-08 DIAGNOSIS — D751 Secondary polycythemia: Secondary | ICD-10-CM | POA: Diagnosis not present

## 2018-08-08 DIAGNOSIS — F17210 Nicotine dependence, cigarettes, uncomplicated: Secondary | ICD-10-CM | POA: Diagnosis not present

## 2018-08-08 DIAGNOSIS — E43 Unspecified severe protein-calorie malnutrition: Secondary | ICD-10-CM

## 2018-08-08 NOTE — Progress Notes (Signed)
[facility] day:  08/08/18   Chief Complaint: Ian Conley is a 67 y.o. 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  HPI:   The patient was last seen in the hematology clinic on 07/21/2018.  At that time, he was having weakness and memory issues. He had been recently discharged from [facility] to SNF [facility]). Family noted patient was not eating well due to current pureed diet. He was experiencing loose stools.  Exam wasgrossly unremarkable.  WBC was 4800 ([place] 3300).  Hemoglobin was 12.9, hematocrit 38.8, platelets 167,000.  Ferritin was elevated at 1107.  INR was 1.05.  PTT 30.  Folate was 9.3.  ANA was negative.  Hepatitis B core antibody, hepatitis C antibody, and HIV testing was negative.  He did not require a phlebotomy.  Stool for C diff and GI panel were recommended.  He was to see dentistry.  He was to follow-up with GI (no prior colonoscopy).  He saw Dr. [name] on 07/26/2018.  Note reviewed.  Stool studies for an infectious work-up were re-ordered.  Metamucil was felt helpful if negative.  EGD was postponed secondary to his recent stroke.  Upper GI was ordered.  Speech and swallowing studies were ordered to assess his swallowing to determine if he could be put on a different diet.  Colonoscopy was deferred until his anti-platelet therapy could be held safely.  UGI on 07/28/2018 revealed mild relative smooth narrowing of the distal esophagus just proximal to the gastroesophageal junction which restricts the passage of a barium tablet consistent with a mild inflammatory stricture. Patient is scheduled for a CT scan on 08/09/2018.  During the interim, patient continues to reside at [facility]. Diet has been changed to a more solid consistency. Family is bringing in outside foods. Patient states, "I don't eat that stuff that they give me. I don't want that mess. It doesn't look good, so I  don't eat it".  No longer having diarrhea. Previous concern was for diarrhea of presumed infectious origin, however diarrhea resolved without treatment.   Patient is scheduled to see dentist on 09/07/2018.  He denies any pain with chewing.   Past Medical History:  Diagnosis Date  . Allergy   . Anemia   . Hypertension   . Pancreatitis   . Personal history of tobacco use, presenting hazards to health 03/25/2016  . Polycythemia   . Stroke [facility])    2008 9/11    History reviewed. No pertinent surgical history.  Family History  Problem Relation Age of Onset  . Diabetes Mother   . Heart failure Mother   . Cancer Father        Throat   . Stroke Father   . Hypertension Father   . HIV Brother   . Thyroid disease Sister        thyroid cancer  . Hypertension Sister        both sisters    Social History:  reports that he has been smoking cigarettes. He has a 45.00 pack-year smoking history. He has never used smokeless tobacco. He reports that he drank about 21.0 standard drinks of alcohol per week. He reports that he does not use drugs.  He started smoking between the age of 74-18.  He smoked 1 pack a day for 48 years.  He is smoking 1 pack a day.  He drinks 2- 24 ounce beers/day.  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  He lives in [place].  He is currently at [facility].  The patient accompanied by his oldest and youngest sister today.  Allergies:  Allergies  Allergen Reactions  . Penicillins Rash    Has patient had a PCN reaction causing immediate rash, facial/tongue/throat swelling, SOB or lightheadedness with hypotension: Yes Has patient had a PCN reaction causing severe rash involving mucus membranes or skin necrosis: Unknown Has patient had a PCN reaction that required hospitalization: No Has patient had a PCN reaction occurring within the last 10 years: No If all of the above answers are "NO", then may  proceed with Cephalosporin use.    Current Medications: Current Outpatient Medications  Medication Sig Dispense Refill  . atorvastatin (LIPITOR) 80 MG tablet Take 1 tablet (80 mg total) by mouth daily at 6 PM.    . dipyridamole-aspirin (AGGRENOX) 200-25 MG 12hr capsule Take 1 capsule by mouth 2 (two) times daily.    . Multiple Vitamins-Minerals (YOUR LIFE MULTI MENS 50+) TABS Take by mouth.    . pantoprazole (PROTONIX) 20 MG tablet Take 1 tablet (20 mg total) by mouth 2 (two) times daily before a meal. 60 tablet 0  . polyethylene glycol (MIRALAX / GLYCOLAX) packet Take 17 g by mouth daily as needed for mild constipation. (Patient not taking: Reported on 08/08/2018) 14 each 0   No current facility-administered medications for this visit.     Review of Systems  Constitutional: Positive for malaise/fatigue. Negative for diaphoresis, fever and weight loss (up 4 pounds).  HENT: Negative.  Negative for congestion, ear discharge, ear pain, nosebleeds, sinus pain and sore throat.        No pain with chewing.  Eyes: Negative.  Negative for blurred vision, double vision, photophobia, pain, discharge and redness.  Respiratory: Negative.  Negative for cough, hemoptysis, sputum production and shortness of breath.   Cardiovascular: Negative.  Negative for chest pain, orthopnea, leg swelling and PND.  Gastrointestinal: Negative for abdominal pain, blood in stool, constipation, diarrhea, melena, nausea and vomiting.       Eating better with food brought into nursing home.  Genitourinary: Negative.  Negative for dysuria, frequency, hematuria and urgency.  Musculoskeletal: Positive for joint pain (hands and feet). Negative for back pain, falls and myalgias.  Skin: Negative for itching and rash.       Pressure related skin breakdown to sacrum  Neurological: Positive for weakness (generalized - working with physical therapy at [facility]). Negative for dizziness, tremors and headaches.       PMH (+) for CVA   Endo/Heme/Allergies: Does not bruise/bleed easily.  Psychiatric/Behavioral: Positive for memory loss. Negative for depression. The patient is not nervous/anxious and does not have insomnia.   All other systems reviewed and are negative.  Performance status (ECOG): 2  Vital Signs BP 104/66 (BP Location: Left Arm, Patient Position: Sitting)   Pulse 73   Temp (!) 97 F (36.1 C) (Tympanic)   Resp 18   Ht _0  (1.778 m)   Wt 128 lb 9.6 oz (58.3 kg)   SpO2 97%   BMI 18.45 kg/m   Physical Exam  Constitutional: He is oriented to person, place, and time.  Chronically fatigued gentleman sitting comfortably in the exam room in no acute distress.  HENT:  Head: Normocephalic and atraumatic.  Mouth/Throat: Oropharynx is clear and moist. Abnormal dentition (poor dentition). No oropharyngeal exudate.  Wearing  a black cap.  Gray hair and beard.  Eyes: Pupils are equal, round, and reactive to light. Conjunctivae and EOM are normal. No scleral icterus.  Glasses. Brown eyes.   Neck: Normal range of motion. Neck supple. No JVD present.  Cardiovascular: Normal rate, regular rhythm and normal heart sounds. Exam reveals no gallop and no friction rub.  No murmur heard. Pulmonary/Chest: Effort normal and breath sounds normal. No respiratory distress. He has no wheezes. He has no rales.  Abdominal: Soft. Bowel sounds are normal. He exhibits no distension and no mass. There is no tenderness. There is no rebound and no guarding.  Musculoskeletal: Normal range of motion. He exhibits no tenderness.  Lymphadenopathy:    He has no cervical adenopathy.    He has no axillary adenopathy.       Right: No inguinal and no supraclavicular adenopathy present.       Left: No inguinal and no supraclavicular adenopathy present.  Neurological: He is alert and oriented to person, place, and time.  Skin: Skin is warm. No rash noted. No erythema.  Psychiatric: Mood and affect normal.  Nursing note and vitals  reviewed.   No visits with results within 3 Day(s) from this visit.  Latest known visit with results is:  Appointment on 07/21/2018  Component Date Value Ref Range Status  . Sodium 07/21/2018 135  135 - 145 mmol/L Final  . Potassium 07/21/2018 3.8  3.5 - 5.1 mmol/L Final  . Chloride 07/21/2018 103  98 - 111 mmol/L Final  . CO2 07/21/2018 21* 22 - 32 mmol/L Final  . Glucose, Bld 07/21/2018 101* 70 - 99 mg/dL Final  . BUN 07/21/2018 9  8 - 23 mg/dL Final  . Creatinine, Ser 07/21/2018 0.90  0.61 - 1.24 mg/dL Final  . Calcium 07/21/2018 8.9  8.9 - 10.3 mg/dL Final  . GFR calc non Af Amer 07/21/2018 >60  >60 mL/min Final  . GFR calc Af Amer 07/21/2018 >60  >60 mL/min Final   Comment: (NOTE) The eGFR has been calculated using the CKD EPI equation. This calculation has not been validated in all clinical situations. eGFR's persistently <60 mL/min signify possible Chronic Kidney Disease.   Georgiann Hahn gap 07/21/2018 11  5 - 15 Final   Performed at [facility], [address] [place], [place] [address]  . Anti Nuclear Antibody(ANA) 07/21/2018 Negative  Negative Final   Comment: (NOTE) Performed At: [facility] [place], [place] [id number] Rush Farmer MD QI:[id number]   . HIV Screen 4th Generation wRfx 07/21/2018 Non Reactive  Non Reactive Final   Comment: (NOTE) Performed At: [facility] [address] [place], [place] [id number] Rush Farmer MD JJ:[id number]   . HCV Ab 07/21/2018 <0.1  0.0 - 0.9 s/co ratio Final   Comment: (NOTE)                                  Negative:     < 0.8                             Indeterminate: 0.8 - 0.9                                  Positive:     > 0.9 The CDC recommends that a positive HCV  antibody result be followed up with a HCV Nucleic Acid Amplification test (563149). Performed At: [facility] [place], [place] [id number] Rush Farmer MD IF:[id number]   . Hep B Core Total  Ab 07/21/2018 Negative  Negative Final   Comment: (NOTE) Performed At: [facility] [place], [place] [id number] Rush Farmer MD SJ:[id number]   . Folate 07/21/2018 9.3  >5.9 ng/mL Final   Performed at [facility], [address] [place], [place] [address]  . aPTT 07/21/2018 30  24 - 36 seconds Final   Performed at [facility], [address] [place], [place] [address]  . Prothrombin Time 07/21/2018 13.6  11.4 - 15.2 seconds Final  . INR 07/21/2018 1.05   Final   Performed at [facility], [address] [place], [place] [address]  . Ferritin 07/21/2018 1,107* 24 - 336 ng/mL Final   Performed at [facility], [address] [place], [place] [address]  . WBC 07/21/2018 4.8  3.8 - 10.6 K/uL Final  . RBC 07/21/2018 4.30* 4.40 - 5.90 MIL/uL Final  . Hemoglobin 07/21/2018 12.9* 13.0 - 18.0 g/dL Final  . HCT 07/21/2018 38.8* 40.0 - 52.0 % Final  . MCV 07/21/2018 90.2  80.0 - 100.0 fL Final  . MCH 07/21/2018 30.0  26.0 - 34.0 pg Final  . MCHC 07/21/2018 33.3  32.0 - 36.0 g/dL Final  . RDW 07/21/2018 14.2  11.5 - 14.5 % Final  . Platelets 07/21/2018 167  150 - 440 K/uL Final  . Neutrophils Relative % 07/21/2018 68  % Final  . Neutro Abs 07/21/2018 3.3  1.4 - 6.5 K/uL Final  . Lymphocytes Relative 07/21/2018 22  % Final  . Lymphs Abs 07/21/2018 1.0  1.0 - 3.6 K/uL Final  . Monocytes Relative 07/21/2018 8  % Final  . Monocytes Absolute 07/21/2018 0.4  0.2 - 1.0 K/uL Final  . Eosinophils Relative 07/21/2018 1  % Final  . Eosinophils Absolute 07/21/2018 0.0  0 - 0.7 K/uL Final  . Basophils Relative 07/21/2018 1  % Final  . Basophils Absolute 07/21/2018 0.1  0 - 0.1 K/uL Final   Performed at [facility], [address] [place], [place] [address]    Assessment:  Ian Conley is a 67 y.o. 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  He has a 48 pack year smoking history.  He denies any cardiac  disease, sleep apnea, or testosterone use.  He had a CVA in 09/01/2007.  Work-up on 03/19/2016 revealed a hematocrit of 57.3, hemoglobin 19.5, MCV 91.5, platelets 144,000, and WBC 5000.  Erythropoietin level was 3.0 (2.6-18.5).  Repeat epo level was 6.8 on 05/05/2016.  JAK2 was negative for V617F and exon 12.  Ferritin was 181.  Iron studies revealed a saturation of 41% and a TIBC of 333.  Low dose spiral chest CT scan on 03/25/2016 was negative.  There was notation of a thickened area in the distal stomach or pyloric region.  He has an elevated alkaline phosphatase (139).  PSA was 0.4.  He has never had a colonoscopy.  He undergoes small volume phlebotomy (250-300 cc) if his hematocrit is > 52.  Last phlebotomy was 08/06/2016.  Symptomatically, he is eating better and gaining weight.  Exam is stable.  Plan: 1. Review labs from 07/21/2018. 2. Secondary polycythemia:  No indication for phlebotomy. 3. Discuss GI consult:  Concern raised for weight loss and lack of need for phlebotomies.  Endoscopy  deferred by GI secondary to recent CVA.  UGI revealed mild relative smooth narrowing of the distal esophagus   Follow-up chest and abdomen CT on 08/09/2018. 4.  Weight loss:   Dental issues initially preventing caloric intake.   Weight appears to be improving with outside food being brought into [facility]. 5.  Diarrhea:    Resolved. Stool studies not performed. 6.   Post CVA deconditioning Patient remains at skilled nursing facility (SNF). Form completed. 7.   RTC in 4 months for MD assessment and labs (CBC with diff, CMP).   Honor Loh, NP  08/08/2018, 4:09 PM   I saw and evaluated the patient, participating in the key portions of the service and reviewing pertinent diagnostic studies and records.  I reviewed the nurse practitioner's note and agree with the findings and the plan.  The assessment and plan were discussed with the patient.  A few questions were asked by the  patient and answered.   Nolon Stalls, MD 08/08/2018,4:09 PM

## 2018-08-08 NOTE — Progress Notes (Signed)
No new changes noted today 

## 2018-08-09 ENCOUNTER — Ambulatory Visit
Admission: RE | Admit: 2018-08-09 | Discharge: 2018-08-09 | Disposition: A | Discharge status: Home / Self Care | Payer: Medicare Other | Source: Ambulatory Visit | Attending: Gastroenterology | Admitting: Gastroenterology

## 2018-08-09 DIAGNOSIS — I700 Atherosclerosis of aorta: Secondary | ICD-10-CM | POA: Insufficient documentation

## 2018-08-09 DIAGNOSIS — J432 Centrilobular emphysema: Secondary | ICD-10-CM | POA: Insufficient documentation

## 2018-08-09 DIAGNOSIS — K222 Esophageal obstruction: Secondary | ICD-10-CM | POA: Diagnosis not present

## 2018-08-09 DIAGNOSIS — J438 Other emphysema: Secondary | ICD-10-CM | POA: Insufficient documentation

## 2018-08-09 DIAGNOSIS — K760 Fatty (change of) liver, not elsewhere classified: Secondary | ICD-10-CM | POA: Diagnosis not present

## 2018-08-09 DIAGNOSIS — J439 Emphysema, unspecified: Secondary | ICD-10-CM | POA: Diagnosis not present

## 2018-08-09 MED ORDER — IOPAMIDOL (ISOVUE-300) INJECTION 61%
85.0000 mL | Freq: Once | INTRAVENOUS | Status: AC | PRN
  Administered 2018-08-09: 85 mL via INTRAVENOUS

## 2018-08-10 ENCOUNTER — Other Ambulatory Visit: Payer: Self-pay | Admitting: Adult Health

## 2018-08-10 ENCOUNTER — Telehealth: Payer: Self-pay | Admitting: Gastroenterology

## 2018-08-10 ENCOUNTER — Telehealth: Payer: Self-pay | Admitting: Adult Health

## 2018-08-10 DIAGNOSIS — I679 Cerebrovascular disease, unspecified: Secondary | ICD-10-CM

## 2018-08-10 DIAGNOSIS — I639 Cerebral infarction, unspecified: Secondary | ICD-10-CM

## 2018-08-10 NOTE — Telephone Encounter (Signed)
Pt sister left vm in regards to pt U/S apt that pt had 8/20 she states his follow up apt is not until 10/09 and she wanted to see if she could get this apt before then. Pt is being released from Nursing home and she is trying to line up pt apt.

## 2018-08-10 NOTE — Progress Notes (Signed)
Neurology referral placed at family's request.

## 2018-08-10 NOTE — Telephone Encounter (Signed)
Ms Ian Conley called in regards to [name], He was just released from [facility] for a stroke , they are requesting a referral to neurology.

## 2018-08-15 ENCOUNTER — Other Ambulatory Visit: Payer: Self-pay

## 2018-08-15 NOTE — Patient Outreach (Signed)
[facility]) Care Management  08/15/2018  Ian Conley 25-Nov-1951 [id number]   EMMI: general discharge red alert Referral date: 08/15/18 Referral reason: Got discharge papers Insurance: [place] health care Day #1  Telephone call to patient regarding EMMI general discharge red alert.  Patients sister and designated party release, Ian Conley answered called.  Sister states she is taking calls for patient. Reports patient has recently had a stroke and is having memory issues.  RNCM explained reason for call.  Sister states patient received his discharge patients from the hospital and skilled nursing facility. Sister states patient has a follow up scheduled with the neurologist on 09/21/18.  Sister states she will be taking patient to his appointments. Sister states patient lives with his two sons.  Has good family support.  She states that she called the primary MD office on and left a message on Friday 10/12/18 and today to set up a post hospital discharge follow up.  She states she has not heard back.  Sister states patient is supposed to have [facility] for home therapy.  She states she has not heard from them. She states patient was discharge on Friday.  Sister states she called and left a message with [facility].   Sister states patient has his equipment and medications. She states he is taking his medications as prescribed.  Sister states patient had a scan last week because the doctors felt a mass in his stomach.  She states he is scheduled for a follow up on 09/28/18 for his test results. Sister states she is concerned because patient is not eating very much.  Sister states patient occasionally complaint of back pain. She states she is giving patient extra strength tylenol for the pain.  Sister denies patient having any additional symptoms.  RNCM reviewed signs/ symptoms of stroke. Advised to call 911 for stroke like symptoms.  RNCM gave sister [facility] name and  contact phone number.   RNCM called patients primary MD office and scheduled post hospital discharge follow up for 10/16/18.  Patients sister, Ian Conley called and notified of patients follow up appointment with primary MD.  Sister voice appreciation of assistance.    PLAN: RNCM will follow up with patients sister within 3 business days.  RNCM will send patient [facility] care management brochure/ magnet  Ian Plowman RN,BSN,CCM [facility] Telephonic  [phone]

## 2018-08-16 ENCOUNTER — Encounter: Payer: Self-pay | Admitting: Nurse Practitioner

## 2018-08-16 ENCOUNTER — Ambulatory Visit: Payer: Medicare Other | Admitting: Nurse Practitioner

## 2018-08-16 ENCOUNTER — Telehealth: Payer: Self-pay | Admitting: Gastroenterology

## 2018-08-16 ENCOUNTER — Other Ambulatory Visit: Payer: Self-pay | Admitting: Nurse Practitioner

## 2018-08-16 VITALS — BP 102/51 | HR 91 | Resp 16 | Ht 71.0 in | Wt 134.2 lb

## 2018-08-16 DIAGNOSIS — N390 Urinary tract infection, site not specified: Secondary | ICD-10-CM

## 2018-08-16 DIAGNOSIS — R300 Dysuria: Secondary | ICD-10-CM

## 2018-08-16 DIAGNOSIS — I639 Cerebral infarction, unspecified: Secondary | ICD-10-CM

## 2018-08-16 DIAGNOSIS — F321 Major depressive disorder, single episode, moderate: Secondary | ICD-10-CM

## 2018-08-16 DIAGNOSIS — B351 Tinea unguium: Secondary | ICD-10-CM

## 2018-08-16 LAB — POCT URINALYSIS DIPSTICK
Bilirubin, UA: NEGATIVE
GLUCOSE UA: NEGATIVE
Ketones, UA: NEGATIVE
NITRITE UA: NEGATIVE
Protein, UA: POSITIVE — AB
RBC UA: NEGATIVE
SPEC GRAV UA: 1.02 (ref 1.010–1.025)
UROBILINOGEN UA: 0.2 U/dL
pH, UA: 6 (ref 5.0–8.0)

## 2018-08-16 MED ORDER — NITROFURANTOIN MONOHYD MACRO 100 MG PO CAPS: 100.0000 mg | ORAL_CAPSULE | Freq: Two times a day (BID) | ORAL | 0 refills | Status: DC

## 2018-08-16 MED ORDER — MIRTAZAPINE 7.5 MG PO TABS: 7.5000 mg | ORAL_TABLET | Freq: Every day | ORAL | 3 refills | Status: DC

## 2018-08-16 NOTE — Progress Notes (Signed)
[facility] [place], [place] [address]  Internal MEDICINE  Office Visit Note  Patient Name: Ian Conley  [id number]  [id number]  Date of Service: 08/18/2018    Pt is here for recent hospital follow up.    Chief Complaint  Patient presents with  . Heat Exposure    hospital follow up  . Urinary Tract Infection    lower back pains, for a few months  . Fatigue    going on for a while  . Quality Metric Gaps    want an appointment with podiatrist  . Anxiety     The patient was hospitalized in July 13, 2018 for altered mental status. Did have small stroke. Since then has had more severe weakness of the left side, especially the upper extremity. Has also had some increased memory loss. 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 Upon discharge, it was recommended he have follow up with neurology and PCP. He was then sent to rehab facility for several weeks. Had trouble getting follow ups and expecially new appointment with neurology. After talking with insurance, he now has initial appointment with neurology 09/21/2018.  He was sent discharged to home on Friday. Since then, has been having some trouble with low back pain. Extra strength tylenol has been helping this.  Has been having some trouble with sleep and anxiety since he has been home. This waxes and wanes with some days better than others. Appetite is good on some days and there are days he does not have much of an appetite at all.     Current Medication: Outpatient Encounter Medications as of 08/16/2018  Medication Sig  . atorvastatin (LIPITOR) 80 MG tablet Take 1 tablet (80 mg total) by mouth daily at 6 PM.  . dipyridamole-aspirin (AGGRENOX) 200-25 MG 12hr capsule Take 1 capsule by mouth 2 (two) times daily.  . Multiple Vitamins-Minerals (YOUR LIFE MULTI MENS 50+) TABS Take by mouth.  . pantoprazole (PROTONIX) 20 MG tablet Take 1 tablet (20 mg  total) by mouth 2 (two) times daily before a meal.  . polyethylene glycol (MIRALAX / GLYCOLAX) packet Take 17 g by mouth daily as needed for mild constipation.  . mirtazapine (REMERON) 7.5 MG tablet Take 1 tablet (7.5 mg total) by mouth at bedtime.  . nitrofurantoin, macrocrystal-monohydrate, (MACROBID) 100 MG capsule Take 1 capsule (100 mg total) by mouth 2 (two) times daily.   No facility-administered encounter medications on file as of 08/16/2018.     Surgical History: History reviewed. No pertinent surgical history.  Medical History: Past Medical History:  Diagnosis Date  . Allergy   . Anemia   . Hypertension   . Pancreatitis   . Personal history of tobacco use, presenting hazards to health 03/25/2016  . Polycythemia   . Stroke [facility])    2008 9/11    Family History: Family History  Problem Relation Age of Onset  . Diabetes Mother   . Heart failure Mother   . Cancer Father        Throat   . Stroke Father   . Hypertension Father   . HIV Brother   . Thyroid disease Sister        thyroid cancer  . Hypertension Sister        both sisters    Social History   Socioeconomic History  . Marital status: Widowed    Spouse name: Not on file  . Number  of children: Not on file  . Years of education: Not on file  . Highest education level: Not on file  Occupational History  . Not on file  Social Needs  . Financial resource strain: Not on file  . Food insecurity:    Worry: Not on file    Inability: Not on file  . Transportation needs:    Medical: Not on file    Non-medical: Not on file  Tobacco Use  . Smoking status: Current Every Day Smoker    Packs/day: 1.00    Years: 45.00    Pack years: 45.00    Types: Cigarettes  . Smokeless tobacco: Never Used  Substance and Sexual Activity  . Alcohol use: Not Currently    Alcohol/week: 21.0 standard drinks    Types: 21 Cans of beer per week    Comment: when pt was at home  . Drug use: No  . Sexual activity: Not on file   Lifestyle  . Physical activity:    Days per week: Not on file    Minutes per session: Not on file  . Stress: Not on file  Relationships  . Social connections:    Talks on phone: Not on file    Gets together: Not on file    Attends religious service: Not on file    Active member of club or organization: Not on file    Attends meetings of clubs or organizations: Not on file    Relationship status: Not on file  . Intimate partner violence:    Fear of current or ex partner: Not on file    Emotionally abused: Not on file    Physically abused: Not on file    Forced sexual activity: Not on file  Other Topics Concern  . Not on file  Social History Narrative  . Not on file      Review of Systems  Constitutional: Positive for appetite change and fatigue. Negative for activity change, chills and unexpected weight change.  HENT: Negative for congestion, rhinorrhea, sinus pressure, sinus pain, sneezing and sore throat.   Eyes: Negative.  Negative for redness.  Respiratory: Negative for cough, chest tightness, shortness of breath and wheezing.   Cardiovascular: Negative for chest pain and palpitations.  Gastrointestinal: Negative for abdominal pain, constipation, diarrhea, nausea and vomiting.  Endocrine: Negative for cold intolerance, heat intolerance, polydipsia, polyphagia and polyuria.  Genitourinary: Positive for flank pain and frequency. Negative for dysuria.       Treatment for UTI part of hospital stay for stroke   Musculoskeletal: Positive for back pain and gait problem. Negative for arthralgias, joint swelling and neck pain.  Skin: Negative for rash.       Thickened and soft great toenails, bilaterally.   Allergic/Immunologic: Negative for environmental allergies.  Neurological: Positive for facial asymmetry and weakness. Negative for tremors, speech difficulty and numbness.       The patient having considerable left sided weakness and stiffness. usnig a walker for help with  mobility. Memory has become spotty, especially in regard to events directly before new stroke.   Hematological: Negative for adenopathy. Does not bruise/bleed easily.  Psychiatric/Behavioral: Negative.  Negative for behavioral problems, sleep disturbance and suicidal ideas. The patient is not nervous/anxious.      Today's Vitals   08/16/18 1351  BP: (!) 102/51  Pulse: 91  Resp: 16  SpO2: 98%  Weight: 134 lb 3.2 oz (60.9 kg)  Height: 5\' 11"  (1.803 m)   Physical Exam  Constitutional:  He is oriented to person, place, and time. He appears well-developed and well-nourished. No distress.  HENT:  Head: Normocephalic and atraumatic.  Nose: Nose normal.  Mouth/Throat: Oropharynx is clear and moist. No oropharyngeal exudate.  Eyes: Pupils are equal, round, and reactive to light. Conjunctivae and EOM are normal.  Neck: Normal range of motion. Neck supple. No JVD present. No tracheal deviation present. No thyromegaly present.  Cardiovascular: Normal rate, regular rhythm and normal heart sounds. Exam reveals no gallop and no friction rub.  No murmur heard. Pulmonary/Chest: Effort normal and breath sounds normal. No respiratory distress. He has no wheezes. He has no rales. He exhibits no tenderness.  Abdominal: Soft. Bowel sounds are normal. There is no tenderness. There is no guarding.  Genitourinary:  Genitourinary Comments: Urine sample positive for protein and trace wbc  Musculoskeletal: Normal range of motion.  Increased weakness in left upper and lower extremities. Using a walker to help with mobility issues.   Lymphadenopathy:    He has no cervical adenopathy.  Neurological: He is alert and oriented to person, place, and time. No cranial nerve deficit.  Considerable weakness in left upper and lower extremities. Requiring assistance to move the lower extremity, especially when flexing at the hip and knee. Difficulty present when standing up from seated position. Using a walker to help  mobility issues. Recent memory has declined.    Skin: Skin is warm and dry. He is not diaphoretic.  Psychiatric: His behavior is normal. Judgment and thought content normal. His speech is delayed. He exhibits a depressed mood. He exhibits abnormal recent memory.  Nursing note and vitals reviewed.  Assessment/Plan: 1. Cerebrovascular accident (CVA), unspecified mechanism ([place]) Reviewed progress notes, labs, and images from recent hospitalization due to stroke. Patient to see neurology early next month. Continue with physical and occupational therapy as scheduled.   2. Episode of moderate major depression ([place]) Waxes and wanes. Affecting sleep and appetite. Add mirtazapine 7.5mg  every evening. Will reassess at next visit.  - mirtazapine (REMERON) 7.5 MG tablet; Take 1 tablet (7.5 mg total) by mouth at bedtime.  Dispense: 30 tablet; Refill: 3  3. Onychomycosis of toenail Bilateral great toenails. Refer to podiatry for further evaluation and treatment. - Ambulatory referral to Podiatry  4. Urinary tract infection without hematuria, site unspecified Treat wit hmacrobid 100mg  bid for 7 days. Will send urine for culture and sensitivity and adjust abx as indicated.  - nitrofurantoin, macrocrystal-monohydrate, (MACROBID) 100 MG capsule; Take 1 capsule (100 mg total) by mouth 2 (two) times daily.  Dispense: 14 capsule; Refill: 0  5. Dysuria - POCT Urinalysis Dipstick - CULTURE, URINE COMPREHENSIVE  General Counseling: Hakeen verbalizes understanding of the findings of todays visit and agrees with plan of treatment. I have discussed any further diagnostic evaluation that may be needed or ordered today. We also reviewed his medications today. he has been encouraged to call the office with any questions or concerns that should arise related to todays visit.    Counseling:  This patient was seen by Leretha Pol FNP Collaboration with Dr Lavera Guise as a part of collaborative care  agreement  Orders Placed This Encounter  Procedures  . CULTURE, URINE COMPREHENSIVE  . Ambulatory referral to Podiatry  . POCT Urinalysis Dipstick      I have reviewed all medical records from hospital follow up including radiology reports and consults from other physicians. Appropriate follow up diagnostics will be scheduled as needed. Patient/ Family understands the plan of treatment. Time spent  25 minutes.   Dr Lavera Guise, MD Internal Medicine

## 2018-08-16 NOTE — Telephone Encounter (Signed)
Pt sister left vm she is returning a call she was in the shower when someone called her

## 2018-08-16 NOTE — Telephone Encounter (Signed)
I spoke with sister, see result note.

## 2018-08-17 ENCOUNTER — Ambulatory Visit: Payer: Self-pay

## 2018-08-18 ENCOUNTER — Other Ambulatory Visit: Payer: Self-pay

## 2018-08-18 ENCOUNTER — Telehealth: Payer: Self-pay | Admitting: Internal Medicine

## 2018-08-18 DIAGNOSIS — N390 Urinary tract infection, site not specified: Secondary | ICD-10-CM | POA: Insufficient documentation

## 2018-08-18 DIAGNOSIS — F321 Major depressive disorder, single episode, moderate: Secondary | ICD-10-CM | POA: Insufficient documentation

## 2018-08-18 DIAGNOSIS — B351 Tinea unguium: Secondary | ICD-10-CM | POA: Insufficient documentation

## 2018-08-18 DIAGNOSIS — R300 Dysuria: Secondary | ICD-10-CM | POA: Insufficient documentation

## 2018-08-18 NOTE — Patient Outreach (Signed)
[facility]) Care Management  08/18/2018  TRAJON ROSETE 1950-12-26 [id number]   EMMI: general discharge red alert Referral date: 08/15/18 Referral reason: Got discharge papers Insurance: [place] health care Day #1  Telephone call to patient's sister, Despina Hidden for follow up. Sister states patient was seen by his doctor on  08/16/18.  She states patient was diagnosed with a urinary tract infection.  She reports the doctor giving patient a prescription of antibiotics, for his anxiety, and a medication to help increase patients appetite.  Sister states patient is scheduled to follow up with the doctor on 10/20/18 or call sooner for any further concerns.  Sister states she was told the home health agency [facility] would be calling her on yesterday.  She states she did not hear from them so she called them this morning.  She states she was told that someone would be out on tomorrow 08/19/18.  RNCM offered to call and confirm scheduled appointment with the home health agency.  Sister verbalized appreciation.  She states," they need to let me know if they can't do it so that I can call his insurance company and find an agency that can.  I don't want to keep getting the run around."  RNCM contacted [facility] home [facility] and spoke with Deneise Lever.  She states patient is scheduled for a home visit on tomorrow 08/19/18 with nurse Estill Bamberg.  RNCM contacted patients sister to inform her that Home health start of service date is scheduled for 08/19/18. Advised sister to contact RNCM if she has any further problems/ concerns.  Sister voiced appreciation for assistance and support.    PLAN; RNCM will close patient due to patient having no further needs at this time.   RNCM will send patients primary MD closure notification   Quinn Plowman RN,BSN,CCM [facility] Telephonic  [phone]

## 2018-08-18 NOTE — Telephone Encounter (Signed)
They called to let us know that they will be going in to see the patient tomorrow by home health nurse

## 2018-08-18 NOTE — Patient Outreach (Signed)
[facility]) Care Management  08/18/2018  [name] 08-Oct-1951 [id number]  EMMI:general discharge red alert Referral date:08/15/18 Referral reason:Got discharge papers Insurance: [employer] health care Day #1  Received call from Sharon Seller, scheduler with [facility] at [facility] to confirm that patient will be seen on tomorrow by home health nurse.   PLAN: No further follow up needed by this RNCM at this time.   Quinn Plowman RN,BSN,CCM [facility] Telephonic  [phone]

## 2018-08-19 ENCOUNTER — Telehealth: Payer: Self-pay

## 2018-08-19 DIAGNOSIS — Z9181 History of falling: Secondary | ICD-10-CM | POA: Diagnosis not present

## 2018-08-19 DIAGNOSIS — I69354 Hemiplegia and hemiparesis following cerebral infarction affecting left non-dominant side: Secondary | ICD-10-CM | POA: Diagnosis not present

## 2018-08-19 DIAGNOSIS — M1611 Unilateral primary osteoarthritis, right hip: Secondary | ICD-10-CM | POA: Diagnosis not present

## 2018-08-19 DIAGNOSIS — N390 Urinary tract infection, site not specified: Secondary | ICD-10-CM | POA: Diagnosis not present

## 2018-08-19 DIAGNOSIS — R1312 Dysphagia, oropharyngeal phase: Secondary | ICD-10-CM | POA: Diagnosis not present

## 2018-08-19 DIAGNOSIS — E43 Unspecified severe protein-calorie malnutrition: Secondary | ICD-10-CM | POA: Diagnosis not present

## 2018-08-19 DIAGNOSIS — I69391 Dysphagia following cerebral infarction: Secondary | ICD-10-CM | POA: Diagnosis not present

## 2018-08-19 DIAGNOSIS — I6901 Attention and concentration deficit following nontraumatic subarachnoid hemorrhage: Secondary | ICD-10-CM | POA: Diagnosis not present

## 2018-08-19 DIAGNOSIS — I10 Essential (primary) hypertension: Secondary | ICD-10-CM | POA: Diagnosis not present

## 2018-08-19 DIAGNOSIS — Z87891 Personal history of nicotine dependence: Secondary | ICD-10-CM | POA: Diagnosis not present

## 2018-08-19 LAB — CULTURE, URINE COMPREHENSIVE

## 2018-08-19 NOTE — Telephone Encounter (Signed)
I spoke with sister, currently leave appt as is.

## 2018-08-19 NOTE — Telephone Encounter (Signed)
Gave vernal order to [facility] nurse Estill Bamberg ([phone] )for nursing stroke management for 2 twice a week for 2 weeks and once a week for 4 weeks as per heather its ok

## 2018-08-23 ENCOUNTER — Telehealth: Payer: Self-pay

## 2018-08-23 ENCOUNTER — Other Ambulatory Visit: Payer: Self-pay | Admitting: Nurse Practitioner

## 2018-08-23 DIAGNOSIS — I10 Essential (primary) hypertension: Secondary | ICD-10-CM | POA: Diagnosis not present

## 2018-08-23 DIAGNOSIS — E43 Unspecified severe protein-calorie malnutrition: Secondary | ICD-10-CM | POA: Diagnosis not present

## 2018-08-23 DIAGNOSIS — N390 Urinary tract infection, site not specified: Secondary | ICD-10-CM | POA: Diagnosis not present

## 2018-08-23 DIAGNOSIS — Z87891 Personal history of nicotine dependence: Secondary | ICD-10-CM | POA: Diagnosis not present

## 2018-08-23 DIAGNOSIS — I69354 Hemiplegia and hemiparesis following cerebral infarction affecting left non-dominant side: Secondary | ICD-10-CM | POA: Diagnosis not present

## 2018-08-23 DIAGNOSIS — M1611 Unilateral primary osteoarthritis, right hip: Secondary | ICD-10-CM | POA: Diagnosis not present

## 2018-08-23 DIAGNOSIS — F01518 Vascular dementia, unspecified severity, with other behavioral disturbance: Secondary | ICD-10-CM

## 2018-08-23 DIAGNOSIS — I69391 Dysphagia following cerebral infarction: Secondary | ICD-10-CM | POA: Diagnosis not present

## 2018-08-23 DIAGNOSIS — Z9181 History of falling: Secondary | ICD-10-CM | POA: Diagnosis not present

## 2018-08-23 DIAGNOSIS — I6901 Attention and concentration deficit following nontraumatic subarachnoid hemorrhage: Secondary | ICD-10-CM | POA: Diagnosis not present

## 2018-08-23 DIAGNOSIS — R1312 Dysphagia, oropharyngeal phase: Secondary | ICD-10-CM | POA: Diagnosis not present

## 2018-08-23 DIAGNOSIS — F0151 Vascular dementia with behavioral disturbance: Secondary | ICD-10-CM

## 2018-08-23 MED ORDER — LORAZEPAM 0.5 MG PO TABS: ORAL_TABLET | ORAL | 1 refills | Status: DC

## 2018-08-23 NOTE — Telephone Encounter (Signed)
Added lorazepam 0.5mg  tablets. May take 1/2 to 1 tablet, up to twice daily. May make him sleepy or dizzy. Should watch for side effects. Only take as needed. Continue the other medication in the evening as prescribed.

## 2018-08-23 NOTE — Progress Notes (Signed)
Added lorazepam 0.5mg  tablets. May take 1/2 to 1 tablet, up to twice daily. May make him sleepy or dizzy. Should watch for side effects. Only take as needed. Continue the other medication in the evening as prescribed.

## 2018-08-24 NOTE — Telephone Encounter (Signed)
Pt advised we add med on and send to phar see detail on other reminder

## 2018-08-24 NOTE — Telephone Encounter (Signed)
Gave verbal agreement for occupational therapy twice a week for [facility]: Phone [phone]

## 2018-08-25 ENCOUNTER — Encounter: Payer: Self-pay | Admitting: Podiatry

## 2018-08-25 ENCOUNTER — Ambulatory Visit: Payer: Medicare Other | Admitting: Podiatry

## 2018-08-25 ENCOUNTER — Telehealth: Payer: Self-pay

## 2018-08-25 DIAGNOSIS — I69354 Hemiplegia and hemiparesis following cerebral infarction affecting left non-dominant side: Secondary | ICD-10-CM | POA: Diagnosis not present

## 2018-08-25 DIAGNOSIS — M1611 Unilateral primary osteoarthritis, right hip: Secondary | ICD-10-CM | POA: Diagnosis not present

## 2018-08-25 DIAGNOSIS — N390 Urinary tract infection, site not specified: Secondary | ICD-10-CM | POA: Diagnosis not present

## 2018-08-25 DIAGNOSIS — I10 Essential (primary) hypertension: Secondary | ICD-10-CM | POA: Diagnosis not present

## 2018-08-25 DIAGNOSIS — M79674 Pain in right toe(s): Secondary | ICD-10-CM

## 2018-08-25 DIAGNOSIS — B351 Tinea unguium: Secondary | ICD-10-CM | POA: Diagnosis not present

## 2018-08-25 DIAGNOSIS — E43 Unspecified severe protein-calorie malnutrition: Secondary | ICD-10-CM | POA: Diagnosis not present

## 2018-08-25 DIAGNOSIS — I69391 Dysphagia following cerebral infarction: Secondary | ICD-10-CM | POA: Diagnosis not present

## 2018-08-25 DIAGNOSIS — M79675 Pain in left toe(s): Secondary | ICD-10-CM

## 2018-08-25 DIAGNOSIS — I6901 Attention and concentration deficit following nontraumatic subarachnoid hemorrhage: Secondary | ICD-10-CM | POA: Diagnosis not present

## 2018-08-25 DIAGNOSIS — R1312 Dysphagia, oropharyngeal phase: Secondary | ICD-10-CM | POA: Diagnosis not present

## 2018-08-25 DIAGNOSIS — Q828 Other specified congenital malformations of skin: Secondary | ICD-10-CM | POA: Diagnosis not present

## 2018-08-25 DIAGNOSIS — Z87891 Personal history of nicotine dependence: Secondary | ICD-10-CM | POA: Diagnosis not present

## 2018-08-25 DIAGNOSIS — Z9181 History of falling: Secondary | ICD-10-CM | POA: Diagnosis not present

## 2018-08-25 NOTE — Telephone Encounter (Signed)
Faxed order [facility] for 3 in one commode as per requested by home health

## 2018-08-25 NOTE — Progress Notes (Signed)
Complaint:  Visit Type: Patient presents  to my office for  preventative foot care services. Complaint: Patient states" my nails have grown long and thick and become painful to walk and wear shoes" Patient has developed painful calluses under the ball of both feet.. The patient presents for preventative foot care services. No changes to ROS.  Patient has history of CVA.  Podiatric Exam: Vascular: dorsalis pedis and posterior tibial pulses are palpable bilateral. Capillary return is immediate. Temperature gradient is WNL. Skin turgor WNL  Sensorium: Normal Semmes Weinstein monofilament test. Normal tactile sensation bilaterally. Nail Exam: Pt has thick disfigured discolored nails with subungual debris noted bilateral entire nail hallux through fifth toenails Ulcer Exam: There is no evidence of ulcer or pre-ulcerative changes or infection. Orthopedic Exam: Muscle tone and strength are WNL. No limitations in general ROM. No crepitus or effusions noted. Foot type and digits show no abnormalities. Bony prominences are unremarkable. Skin:  Porokeratosis sub 1 tibial sesamoid  B/L. No infection or ulcers  Diagnosis:  Onychomycosis, , Pain in right toe, pain in left toes  Treatment & Plan Procedures and Treatment: Consent by patient was obtained for treatment procedures.   Debridement of mycotic and hypertrophic toenails, 1 through 5 bilateral and clearing of subungual debris. No ulceration, no infection noted. Debride porokeratosis  B/L Return Visit-Office Procedure: Patient instructed to return to the office for a follow up visit 3 months for continued evaluation and treatment.    Gardiner Barefoot DPM

## 2018-08-26 ENCOUNTER — Telehealth: Payer: Self-pay | Admitting: Internal Medicine

## 2018-08-26 DIAGNOSIS — I69354 Hemiplegia and hemiparesis following cerebral infarction affecting left non-dominant side: Secondary | ICD-10-CM | POA: Diagnosis not present

## 2018-08-26 DIAGNOSIS — I6901 Attention and concentration deficit following nontraumatic subarachnoid hemorrhage: Secondary | ICD-10-CM | POA: Diagnosis not present

## 2018-08-26 DIAGNOSIS — I10 Essential (primary) hypertension: Secondary | ICD-10-CM | POA: Diagnosis not present

## 2018-08-26 DIAGNOSIS — E43 Unspecified severe protein-calorie malnutrition: Secondary | ICD-10-CM | POA: Diagnosis not present

## 2018-08-26 DIAGNOSIS — M1611 Unilateral primary osteoarthritis, right hip: Secondary | ICD-10-CM | POA: Diagnosis not present

## 2018-08-26 DIAGNOSIS — Z87891 Personal history of nicotine dependence: Secondary | ICD-10-CM | POA: Diagnosis not present

## 2018-08-26 DIAGNOSIS — N390 Urinary tract infection, site not specified: Secondary | ICD-10-CM | POA: Diagnosis not present

## 2018-08-26 DIAGNOSIS — I69391 Dysphagia following cerebral infarction: Secondary | ICD-10-CM | POA: Diagnosis not present

## 2018-08-26 DIAGNOSIS — Z9181 History of falling: Secondary | ICD-10-CM | POA: Diagnosis not present

## 2018-08-26 DIAGNOSIS — R1312 Dysphagia, oropharyngeal phase: Secondary | ICD-10-CM | POA: Diagnosis not present

## 2018-08-26 NOTE — Telephone Encounter (Signed)
Fax complete

## 2018-08-29 DIAGNOSIS — Z9181 History of falling: Secondary | ICD-10-CM | POA: Diagnosis not present

## 2018-08-29 DIAGNOSIS — I10 Essential (primary) hypertension: Secondary | ICD-10-CM | POA: Diagnosis not present

## 2018-08-29 DIAGNOSIS — M1611 Unilateral primary osteoarthritis, right hip: Secondary | ICD-10-CM | POA: Diagnosis not present

## 2018-08-29 DIAGNOSIS — E43 Unspecified severe protein-calorie malnutrition: Secondary | ICD-10-CM | POA: Diagnosis not present

## 2018-08-29 DIAGNOSIS — I69354 Hemiplegia and hemiparesis following cerebral infarction affecting left non-dominant side: Secondary | ICD-10-CM | POA: Diagnosis not present

## 2018-08-29 DIAGNOSIS — Z87891 Personal history of nicotine dependence: Secondary | ICD-10-CM | POA: Diagnosis not present

## 2018-08-29 DIAGNOSIS — R1312 Dysphagia, oropharyngeal phase: Secondary | ICD-10-CM | POA: Diagnosis not present

## 2018-08-29 DIAGNOSIS — N390 Urinary tract infection, site not specified: Secondary | ICD-10-CM | POA: Diagnosis not present

## 2018-08-29 DIAGNOSIS — I6901 Attention and concentration deficit following nontraumatic subarachnoid hemorrhage: Secondary | ICD-10-CM | POA: Diagnosis not present

## 2018-08-29 DIAGNOSIS — I69391 Dysphagia following cerebral infarction: Secondary | ICD-10-CM | POA: Diagnosis not present

## 2018-08-31 DIAGNOSIS — Z87891 Personal history of nicotine dependence: Secondary | ICD-10-CM | POA: Diagnosis not present

## 2018-08-31 DIAGNOSIS — M1611 Unilateral primary osteoarthritis, right hip: Secondary | ICD-10-CM | POA: Diagnosis not present

## 2018-08-31 DIAGNOSIS — I69354 Hemiplegia and hemiparesis following cerebral infarction affecting left non-dominant side: Secondary | ICD-10-CM | POA: Diagnosis not present

## 2018-08-31 DIAGNOSIS — I10 Essential (primary) hypertension: Secondary | ICD-10-CM | POA: Diagnosis not present

## 2018-08-31 DIAGNOSIS — I6901 Attention and concentration deficit following nontraumatic subarachnoid hemorrhage: Secondary | ICD-10-CM | POA: Diagnosis not present

## 2018-08-31 DIAGNOSIS — N390 Urinary tract infection, site not specified: Secondary | ICD-10-CM | POA: Diagnosis not present

## 2018-08-31 DIAGNOSIS — E43 Unspecified severe protein-calorie malnutrition: Secondary | ICD-10-CM | POA: Diagnosis not present

## 2018-08-31 DIAGNOSIS — Z9181 History of falling: Secondary | ICD-10-CM | POA: Diagnosis not present

## 2018-08-31 DIAGNOSIS — I69391 Dysphagia following cerebral infarction: Secondary | ICD-10-CM | POA: Diagnosis not present

## 2018-08-31 DIAGNOSIS — R1312 Dysphagia, oropharyngeal phase: Secondary | ICD-10-CM | POA: Diagnosis not present

## 2018-09-01 DIAGNOSIS — I10 Essential (primary) hypertension: Secondary | ICD-10-CM | POA: Diagnosis not present

## 2018-09-01 DIAGNOSIS — Z87891 Personal history of nicotine dependence: Secondary | ICD-10-CM | POA: Diagnosis not present

## 2018-09-01 DIAGNOSIS — I6901 Attention and concentration deficit following nontraumatic subarachnoid hemorrhage: Secondary | ICD-10-CM | POA: Diagnosis not present

## 2018-09-01 DIAGNOSIS — I69391 Dysphagia following cerebral infarction: Secondary | ICD-10-CM | POA: Diagnosis not present

## 2018-09-01 DIAGNOSIS — N390 Urinary tract infection, site not specified: Secondary | ICD-10-CM | POA: Diagnosis not present

## 2018-09-01 DIAGNOSIS — I69354 Hemiplegia and hemiparesis following cerebral infarction affecting left non-dominant side: Secondary | ICD-10-CM | POA: Diagnosis not present

## 2018-09-01 DIAGNOSIS — Z9181 History of falling: Secondary | ICD-10-CM | POA: Diagnosis not present

## 2018-09-01 DIAGNOSIS — R1312 Dysphagia, oropharyngeal phase: Secondary | ICD-10-CM | POA: Diagnosis not present

## 2018-09-01 DIAGNOSIS — E43 Unspecified severe protein-calorie malnutrition: Secondary | ICD-10-CM | POA: Diagnosis not present

## 2018-09-01 DIAGNOSIS — M1611 Unilateral primary osteoarthritis, right hip: Secondary | ICD-10-CM | POA: Diagnosis not present

## 2018-09-02 DIAGNOSIS — Z9181 History of falling: Secondary | ICD-10-CM | POA: Diagnosis not present

## 2018-09-02 DIAGNOSIS — I69391 Dysphagia following cerebral infarction: Secondary | ICD-10-CM | POA: Diagnosis not present

## 2018-09-02 DIAGNOSIS — N390 Urinary tract infection, site not specified: Secondary | ICD-10-CM | POA: Diagnosis not present

## 2018-09-02 DIAGNOSIS — I10 Essential (primary) hypertension: Secondary | ICD-10-CM | POA: Diagnosis not present

## 2018-09-02 DIAGNOSIS — E43 Unspecified severe protein-calorie malnutrition: Secondary | ICD-10-CM | POA: Diagnosis not present

## 2018-09-02 DIAGNOSIS — R1312 Dysphagia, oropharyngeal phase: Secondary | ICD-10-CM | POA: Diagnosis not present

## 2018-09-02 DIAGNOSIS — M1611 Unilateral primary osteoarthritis, right hip: Secondary | ICD-10-CM | POA: Diagnosis not present

## 2018-09-02 DIAGNOSIS — Z87891 Personal history of nicotine dependence: Secondary | ICD-10-CM | POA: Diagnosis not present

## 2018-09-02 DIAGNOSIS — I69354 Hemiplegia and hemiparesis following cerebral infarction affecting left non-dominant side: Secondary | ICD-10-CM | POA: Diagnosis not present

## 2018-09-02 DIAGNOSIS — I6901 Attention and concentration deficit following nontraumatic subarachnoid hemorrhage: Secondary | ICD-10-CM | POA: Diagnosis not present

## 2018-09-05 DIAGNOSIS — Z9181 History of falling: Secondary | ICD-10-CM | POA: Diagnosis not present

## 2018-09-05 DIAGNOSIS — Z87891 Personal history of nicotine dependence: Secondary | ICD-10-CM | POA: Diagnosis not present

## 2018-09-05 DIAGNOSIS — I69391 Dysphagia following cerebral infarction: Secondary | ICD-10-CM | POA: Diagnosis not present

## 2018-09-05 DIAGNOSIS — N390 Urinary tract infection, site not specified: Secondary | ICD-10-CM | POA: Diagnosis not present

## 2018-09-05 DIAGNOSIS — R1312 Dysphagia, oropharyngeal phase: Secondary | ICD-10-CM | POA: Diagnosis not present

## 2018-09-05 DIAGNOSIS — I6901 Attention and concentration deficit following nontraumatic subarachnoid hemorrhage: Secondary | ICD-10-CM | POA: Diagnosis not present

## 2018-09-05 DIAGNOSIS — E43 Unspecified severe protein-calorie malnutrition: Secondary | ICD-10-CM | POA: Diagnosis not present

## 2018-09-05 DIAGNOSIS — I10 Essential (primary) hypertension: Secondary | ICD-10-CM | POA: Diagnosis not present

## 2018-09-05 DIAGNOSIS — I69354 Hemiplegia and hemiparesis following cerebral infarction affecting left non-dominant side: Secondary | ICD-10-CM | POA: Diagnosis not present

## 2018-09-05 DIAGNOSIS — M1611 Unilateral primary osteoarthritis, right hip: Secondary | ICD-10-CM | POA: Diagnosis not present

## 2018-09-06 DIAGNOSIS — M1611 Unilateral primary osteoarthritis, right hip: Secondary | ICD-10-CM | POA: Diagnosis not present

## 2018-09-06 DIAGNOSIS — Z87891 Personal history of nicotine dependence: Secondary | ICD-10-CM | POA: Diagnosis not present

## 2018-09-06 DIAGNOSIS — E43 Unspecified severe protein-calorie malnutrition: Secondary | ICD-10-CM | POA: Diagnosis not present

## 2018-09-06 DIAGNOSIS — R1312 Dysphagia, oropharyngeal phase: Secondary | ICD-10-CM | POA: Diagnosis not present

## 2018-09-06 DIAGNOSIS — I69354 Hemiplegia and hemiparesis following cerebral infarction affecting left non-dominant side: Secondary | ICD-10-CM | POA: Diagnosis not present

## 2018-09-06 DIAGNOSIS — I6901 Attention and concentration deficit following nontraumatic subarachnoid hemorrhage: Secondary | ICD-10-CM | POA: Diagnosis not present

## 2018-09-06 DIAGNOSIS — I10 Essential (primary) hypertension: Secondary | ICD-10-CM | POA: Diagnosis not present

## 2018-09-06 DIAGNOSIS — Z9181 History of falling: Secondary | ICD-10-CM | POA: Diagnosis not present

## 2018-09-06 DIAGNOSIS — N390 Urinary tract infection, site not specified: Secondary | ICD-10-CM | POA: Diagnosis not present

## 2018-09-06 DIAGNOSIS — I69391 Dysphagia following cerebral infarction: Secondary | ICD-10-CM | POA: Diagnosis not present

## 2018-09-07 ENCOUNTER — Ambulatory Visit: Payer: Medicare Other | Admitting: Gastroenterology

## 2018-09-07 DIAGNOSIS — E43 Unspecified severe protein-calorie malnutrition: Secondary | ICD-10-CM | POA: Diagnosis not present

## 2018-09-07 DIAGNOSIS — Z87891 Personal history of nicotine dependence: Secondary | ICD-10-CM | POA: Diagnosis not present

## 2018-09-07 DIAGNOSIS — M1611 Unilateral primary osteoarthritis, right hip: Secondary | ICD-10-CM | POA: Diagnosis not present

## 2018-09-07 DIAGNOSIS — I69391 Dysphagia following cerebral infarction: Secondary | ICD-10-CM | POA: Diagnosis not present

## 2018-09-07 DIAGNOSIS — N390 Urinary tract infection, site not specified: Secondary | ICD-10-CM | POA: Diagnosis not present

## 2018-09-07 DIAGNOSIS — I10 Essential (primary) hypertension: Secondary | ICD-10-CM | POA: Diagnosis not present

## 2018-09-07 DIAGNOSIS — R1312 Dysphagia, oropharyngeal phase: Secondary | ICD-10-CM | POA: Diagnosis not present

## 2018-09-07 DIAGNOSIS — I69354 Hemiplegia and hemiparesis following cerebral infarction affecting left non-dominant side: Secondary | ICD-10-CM | POA: Diagnosis not present

## 2018-09-07 DIAGNOSIS — I6901 Attention and concentration deficit following nontraumatic subarachnoid hemorrhage: Secondary | ICD-10-CM | POA: Diagnosis not present

## 2018-09-07 DIAGNOSIS — Z9181 History of falling: Secondary | ICD-10-CM | POA: Diagnosis not present

## 2018-09-08 DIAGNOSIS — E43 Unspecified severe protein-calorie malnutrition: Secondary | ICD-10-CM | POA: Diagnosis not present

## 2018-09-08 DIAGNOSIS — I10 Essential (primary) hypertension: Secondary | ICD-10-CM | POA: Diagnosis not present

## 2018-09-08 DIAGNOSIS — I69354 Hemiplegia and hemiparesis following cerebral infarction affecting left non-dominant side: Secondary | ICD-10-CM | POA: Diagnosis not present

## 2018-09-08 DIAGNOSIS — Z87891 Personal history of nicotine dependence: Secondary | ICD-10-CM | POA: Diagnosis not present

## 2018-09-08 DIAGNOSIS — Z9181 History of falling: Secondary | ICD-10-CM | POA: Diagnosis not present

## 2018-09-08 DIAGNOSIS — I69391 Dysphagia following cerebral infarction: Secondary | ICD-10-CM | POA: Diagnosis not present

## 2018-09-08 DIAGNOSIS — I6901 Attention and concentration deficit following nontraumatic subarachnoid hemorrhage: Secondary | ICD-10-CM | POA: Diagnosis not present

## 2018-09-08 DIAGNOSIS — N390 Urinary tract infection, site not specified: Secondary | ICD-10-CM | POA: Diagnosis not present

## 2018-09-08 DIAGNOSIS — R1312 Dysphagia, oropharyngeal phase: Secondary | ICD-10-CM | POA: Diagnosis not present

## 2018-09-08 DIAGNOSIS — M1611 Unilateral primary osteoarthritis, right hip: Secondary | ICD-10-CM | POA: Diagnosis not present

## 2018-09-09 DIAGNOSIS — Z87891 Personal history of nicotine dependence: Secondary | ICD-10-CM | POA: Diagnosis not present

## 2018-09-09 DIAGNOSIS — I6901 Attention and concentration deficit following nontraumatic subarachnoid hemorrhage: Secondary | ICD-10-CM | POA: Diagnosis not present

## 2018-09-09 DIAGNOSIS — I69391 Dysphagia following cerebral infarction: Secondary | ICD-10-CM | POA: Diagnosis not present

## 2018-09-09 DIAGNOSIS — Z9181 History of falling: Secondary | ICD-10-CM | POA: Diagnosis not present

## 2018-09-09 DIAGNOSIS — M1611 Unilateral primary osteoarthritis, right hip: Secondary | ICD-10-CM | POA: Diagnosis not present

## 2018-09-09 DIAGNOSIS — N390 Urinary tract infection, site not specified: Secondary | ICD-10-CM | POA: Diagnosis not present

## 2018-09-09 DIAGNOSIS — I10 Essential (primary) hypertension: Secondary | ICD-10-CM | POA: Diagnosis not present

## 2018-09-09 DIAGNOSIS — E43 Unspecified severe protein-calorie malnutrition: Secondary | ICD-10-CM | POA: Diagnosis not present

## 2018-09-09 DIAGNOSIS — R1312 Dysphagia, oropharyngeal phase: Secondary | ICD-10-CM | POA: Diagnosis not present

## 2018-09-09 DIAGNOSIS — I69354 Hemiplegia and hemiparesis following cerebral infarction affecting left non-dominant side: Secondary | ICD-10-CM | POA: Diagnosis not present

## 2018-09-12 DIAGNOSIS — R1312 Dysphagia, oropharyngeal phase: Secondary | ICD-10-CM | POA: Diagnosis not present

## 2018-09-12 DIAGNOSIS — N390 Urinary tract infection, site not specified: Secondary | ICD-10-CM | POA: Diagnosis not present

## 2018-09-12 DIAGNOSIS — I6901 Attention and concentration deficit following nontraumatic subarachnoid hemorrhage: Secondary | ICD-10-CM | POA: Diagnosis not present

## 2018-09-12 DIAGNOSIS — I69354 Hemiplegia and hemiparesis following cerebral infarction affecting left non-dominant side: Secondary | ICD-10-CM | POA: Diagnosis not present

## 2018-09-12 DIAGNOSIS — E43 Unspecified severe protein-calorie malnutrition: Secondary | ICD-10-CM | POA: Diagnosis not present

## 2018-09-12 DIAGNOSIS — Z9181 History of falling: Secondary | ICD-10-CM | POA: Diagnosis not present

## 2018-09-12 DIAGNOSIS — M1611 Unilateral primary osteoarthritis, right hip: Secondary | ICD-10-CM | POA: Diagnosis not present

## 2018-09-12 DIAGNOSIS — I10 Essential (primary) hypertension: Secondary | ICD-10-CM | POA: Diagnosis not present

## 2018-09-12 DIAGNOSIS — I69391 Dysphagia following cerebral infarction: Secondary | ICD-10-CM | POA: Diagnosis not present

## 2018-09-12 DIAGNOSIS — Z87891 Personal history of nicotine dependence: Secondary | ICD-10-CM | POA: Diagnosis not present

## 2018-09-13 ENCOUNTER — Other Ambulatory Visit: Payer: Self-pay

## 2018-09-13 DIAGNOSIS — Z87891 Personal history of nicotine dependence: Secondary | ICD-10-CM | POA: Diagnosis not present

## 2018-09-13 DIAGNOSIS — I6901 Attention and concentration deficit following nontraumatic subarachnoid hemorrhage: Secondary | ICD-10-CM | POA: Diagnosis not present

## 2018-09-13 DIAGNOSIS — I69354 Hemiplegia and hemiparesis following cerebral infarction affecting left non-dominant side: Secondary | ICD-10-CM | POA: Diagnosis not present

## 2018-09-13 DIAGNOSIS — R1312 Dysphagia, oropharyngeal phase: Secondary | ICD-10-CM | POA: Diagnosis not present

## 2018-09-13 DIAGNOSIS — I69391 Dysphagia following cerebral infarction: Secondary | ICD-10-CM | POA: Diagnosis not present

## 2018-09-13 DIAGNOSIS — Z9181 History of falling: Secondary | ICD-10-CM | POA: Diagnosis not present

## 2018-09-13 DIAGNOSIS — I10 Essential (primary) hypertension: Secondary | ICD-10-CM | POA: Diagnosis not present

## 2018-09-13 DIAGNOSIS — N390 Urinary tract infection, site not specified: Secondary | ICD-10-CM | POA: Diagnosis not present

## 2018-09-13 DIAGNOSIS — E43 Unspecified severe protein-calorie malnutrition: Secondary | ICD-10-CM | POA: Diagnosis not present

## 2018-09-13 DIAGNOSIS — M1611 Unilateral primary osteoarthritis, right hip: Secondary | ICD-10-CM | POA: Diagnosis not present

## 2018-09-13 MED ORDER — ATORVASTATIN CALCIUM 80 MG PO TABS: 80.0000 mg | ORAL_TABLET | Freq: Every day | ORAL | 1 refills | Status: DC

## 2018-09-13 MED ORDER — ASPIRIN-DIPYRIDAMOLE ER 25-200 MG PO CP12: 1.0000 | ORAL_CAPSULE | Freq: Two times a day (BID) | ORAL | 1 refills | Status: DC

## 2018-09-14 DIAGNOSIS — R1312 Dysphagia, oropharyngeal phase: Secondary | ICD-10-CM | POA: Diagnosis not present

## 2018-09-14 DIAGNOSIS — I69354 Hemiplegia and hemiparesis following cerebral infarction affecting left non-dominant side: Secondary | ICD-10-CM | POA: Diagnosis not present

## 2018-09-14 DIAGNOSIS — E43 Unspecified severe protein-calorie malnutrition: Secondary | ICD-10-CM | POA: Diagnosis not present

## 2018-09-14 DIAGNOSIS — Z9181 History of falling: Secondary | ICD-10-CM | POA: Diagnosis not present

## 2018-09-14 DIAGNOSIS — Z87891 Personal history of nicotine dependence: Secondary | ICD-10-CM | POA: Diagnosis not present

## 2018-09-14 DIAGNOSIS — I6901 Attention and concentration deficit following nontraumatic subarachnoid hemorrhage: Secondary | ICD-10-CM | POA: Diagnosis not present

## 2018-09-14 DIAGNOSIS — M1611 Unilateral primary osteoarthritis, right hip: Secondary | ICD-10-CM | POA: Diagnosis not present

## 2018-09-14 DIAGNOSIS — I69391 Dysphagia following cerebral infarction: Secondary | ICD-10-CM | POA: Diagnosis not present

## 2018-09-14 DIAGNOSIS — I10 Essential (primary) hypertension: Secondary | ICD-10-CM | POA: Diagnosis not present

## 2018-09-14 DIAGNOSIS — N390 Urinary tract infection, site not specified: Secondary | ICD-10-CM | POA: Diagnosis not present

## 2018-09-15 DIAGNOSIS — Z87891 Personal history of nicotine dependence: Secondary | ICD-10-CM | POA: Diagnosis not present

## 2018-09-15 DIAGNOSIS — I69354 Hemiplegia and hemiparesis following cerebral infarction affecting left non-dominant side: Secondary | ICD-10-CM | POA: Diagnosis not present

## 2018-09-15 DIAGNOSIS — N390 Urinary tract infection, site not specified: Secondary | ICD-10-CM | POA: Diagnosis not present

## 2018-09-15 DIAGNOSIS — Z9181 History of falling: Secondary | ICD-10-CM | POA: Diagnosis not present

## 2018-09-15 DIAGNOSIS — I10 Essential (primary) hypertension: Secondary | ICD-10-CM | POA: Diagnosis not present

## 2018-09-15 DIAGNOSIS — E43 Unspecified severe protein-calorie malnutrition: Secondary | ICD-10-CM | POA: Diagnosis not present

## 2018-09-15 DIAGNOSIS — R1312 Dysphagia, oropharyngeal phase: Secondary | ICD-10-CM | POA: Diagnosis not present

## 2018-09-15 DIAGNOSIS — I6901 Attention and concentration deficit following nontraumatic subarachnoid hemorrhage: Secondary | ICD-10-CM | POA: Diagnosis not present

## 2018-09-15 DIAGNOSIS — I69391 Dysphagia following cerebral infarction: Secondary | ICD-10-CM | POA: Diagnosis not present

## 2018-09-15 DIAGNOSIS — M1611 Unilateral primary osteoarthritis, right hip: Secondary | ICD-10-CM | POA: Diagnosis not present

## 2018-09-19 DIAGNOSIS — I6901 Attention and concentration deficit following nontraumatic subarachnoid hemorrhage: Secondary | ICD-10-CM | POA: Diagnosis not present

## 2018-09-19 DIAGNOSIS — E43 Unspecified severe protein-calorie malnutrition: Secondary | ICD-10-CM | POA: Diagnosis not present

## 2018-09-19 DIAGNOSIS — Z87891 Personal history of nicotine dependence: Secondary | ICD-10-CM | POA: Diagnosis not present

## 2018-09-19 DIAGNOSIS — I69391 Dysphagia following cerebral infarction: Secondary | ICD-10-CM | POA: Diagnosis not present

## 2018-09-19 DIAGNOSIS — R1312 Dysphagia, oropharyngeal phase: Secondary | ICD-10-CM | POA: Diagnosis not present

## 2018-09-19 DIAGNOSIS — N390 Urinary tract infection, site not specified: Secondary | ICD-10-CM | POA: Diagnosis not present

## 2018-09-19 DIAGNOSIS — M1611 Unilateral primary osteoarthritis, right hip: Secondary | ICD-10-CM | POA: Diagnosis not present

## 2018-09-19 DIAGNOSIS — Z9181 History of falling: Secondary | ICD-10-CM | POA: Diagnosis not present

## 2018-09-19 DIAGNOSIS — I10 Essential (primary) hypertension: Secondary | ICD-10-CM | POA: Diagnosis not present

## 2018-09-19 DIAGNOSIS — I69354 Hemiplegia and hemiparesis following cerebral infarction affecting left non-dominant side: Secondary | ICD-10-CM | POA: Diagnosis not present

## 2018-09-21 DIAGNOSIS — I69354 Hemiplegia and hemiparesis following cerebral infarction affecting left non-dominant side: Secondary | ICD-10-CM | POA: Diagnosis not present

## 2018-09-21 DIAGNOSIS — Z9181 History of falling: Secondary | ICD-10-CM | POA: Diagnosis not present

## 2018-09-21 DIAGNOSIS — I10 Essential (primary) hypertension: Secondary | ICD-10-CM | POA: Diagnosis not present

## 2018-09-21 DIAGNOSIS — I69391 Dysphagia following cerebral infarction: Secondary | ICD-10-CM | POA: Diagnosis not present

## 2018-09-21 DIAGNOSIS — N390 Urinary tract infection, site not specified: Secondary | ICD-10-CM | POA: Diagnosis not present

## 2018-09-21 DIAGNOSIS — E43 Unspecified severe protein-calorie malnutrition: Secondary | ICD-10-CM | POA: Diagnosis not present

## 2018-09-21 DIAGNOSIS — I6901 Attention and concentration deficit following nontraumatic subarachnoid hemorrhage: Secondary | ICD-10-CM | POA: Diagnosis not present

## 2018-09-21 DIAGNOSIS — R1312 Dysphagia, oropharyngeal phase: Secondary | ICD-10-CM | POA: Diagnosis not present

## 2018-09-21 DIAGNOSIS — Z87891 Personal history of nicotine dependence: Secondary | ICD-10-CM | POA: Diagnosis not present

## 2018-09-21 DIAGNOSIS — M1611 Unilateral primary osteoarthritis, right hip: Secondary | ICD-10-CM | POA: Diagnosis not present

## 2018-09-26 DIAGNOSIS — E43 Unspecified severe protein-calorie malnutrition: Secondary | ICD-10-CM | POA: Diagnosis not present

## 2018-09-26 DIAGNOSIS — Z9181 History of falling: Secondary | ICD-10-CM | POA: Diagnosis not present

## 2018-09-26 DIAGNOSIS — Z87891 Personal history of nicotine dependence: Secondary | ICD-10-CM | POA: Diagnosis not present

## 2018-09-26 DIAGNOSIS — I69391 Dysphagia following cerebral infarction: Secondary | ICD-10-CM | POA: Diagnosis not present

## 2018-09-26 DIAGNOSIS — M1611 Unilateral primary osteoarthritis, right hip: Secondary | ICD-10-CM | POA: Diagnosis not present

## 2018-09-26 DIAGNOSIS — R1312 Dysphagia, oropharyngeal phase: Secondary | ICD-10-CM | POA: Diagnosis not present

## 2018-09-26 DIAGNOSIS — I10 Essential (primary) hypertension: Secondary | ICD-10-CM | POA: Diagnosis not present

## 2018-09-26 DIAGNOSIS — I6901 Attention and concentration deficit following nontraumatic subarachnoid hemorrhage: Secondary | ICD-10-CM | POA: Diagnosis not present

## 2018-09-26 DIAGNOSIS — I69354 Hemiplegia and hemiparesis following cerebral infarction affecting left non-dominant side: Secondary | ICD-10-CM | POA: Diagnosis not present

## 2018-09-26 DIAGNOSIS — N390 Urinary tract infection, site not specified: Secondary | ICD-10-CM | POA: Diagnosis not present

## 2018-09-27 DIAGNOSIS — R1312 Dysphagia, oropharyngeal phase: Secondary | ICD-10-CM | POA: Diagnosis not present

## 2018-09-27 DIAGNOSIS — I10 Essential (primary) hypertension: Secondary | ICD-10-CM | POA: Diagnosis not present

## 2018-09-27 DIAGNOSIS — Z9181 History of falling: Secondary | ICD-10-CM | POA: Diagnosis not present

## 2018-09-27 DIAGNOSIS — Z8673 Personal history of transient ischemic attack (TIA), and cerebral infarction without residual deficits: Secondary | ICD-10-CM | POA: Diagnosis not present

## 2018-09-27 DIAGNOSIS — I69354 Hemiplegia and hemiparesis following cerebral infarction affecting left non-dominant side: Secondary | ICD-10-CM | POA: Diagnosis not present

## 2018-09-27 DIAGNOSIS — N390 Urinary tract infection, site not specified: Secondary | ICD-10-CM | POA: Diagnosis not present

## 2018-09-27 DIAGNOSIS — Z87891 Personal history of nicotine dependence: Secondary | ICD-10-CM | POA: Diagnosis not present

## 2018-09-27 DIAGNOSIS — M1611 Unilateral primary osteoarthritis, right hip: Secondary | ICD-10-CM | POA: Diagnosis not present

## 2018-09-27 DIAGNOSIS — R413 Other amnesia: Secondary | ICD-10-CM | POA: Diagnosis not present

## 2018-09-27 DIAGNOSIS — I69391 Dysphagia following cerebral infarction: Secondary | ICD-10-CM | POA: Diagnosis not present

## 2018-09-27 DIAGNOSIS — E43 Unspecified severe protein-calorie malnutrition: Secondary | ICD-10-CM | POA: Diagnosis not present

## 2018-09-27 DIAGNOSIS — I6901 Attention and concentration deficit following nontraumatic subarachnoid hemorrhage: Secondary | ICD-10-CM | POA: Diagnosis not present

## 2018-09-28 ENCOUNTER — Ambulatory Visit: Payer: Medicare Other | Admitting: Gastroenterology

## 2018-09-28 ENCOUNTER — Encounter: Payer: Self-pay | Admitting: Gastroenterology

## 2018-09-28 VITALS — BP 130/66 | HR 92 | Ht 71.0 in | Wt 137.2 lb

## 2018-09-28 DIAGNOSIS — R1310 Dysphagia, unspecified: Secondary | ICD-10-CM

## 2018-09-28 NOTE — Progress Notes (Signed)
[name], MD [address]  [place]  [place], [place] [address]  Main: [phone]  Fax: [phone]   Primary Care Physician: Lavera Guise, MD  Primary Gastroenterologist:  Dr. [name]  Chief Complaint  Patient presents with  . Follow-up    dysphagia    HPI: Ian Conley is a 67 y.o. male here for follow-up with his sister.  Both patient and sister report the patient is doing very well.  No further dysphagia.  No abdominal pain.  Good appetite.  No nausea or vomiting.  No altered bowel habits.  No diarrhea.  No hematochezia or melena.  No weight loss.  He is in good health and has no complaints.  Current Outpatient Medications  Medication Sig Dispense Refill  . atorvastatin (LIPITOR) 80 MG tablet Take 1 tablet (80 mg total) by mouth daily at 6 PM. 90 tablet 1  . dipyridamole-aspirin (AGGRENOX) 200-25 MG 12hr capsule Take 1 capsule by mouth 2 (two) times daily. 180 capsule 1  . LORazepam (ATIVAN) 0.5 MG tablet Take 1/2 to 1 tablet po BID prn agitation. 45 tablet 1  . mirtazapine (REMERON) 7.5 MG tablet Take 1 tablet (7.5 mg total) by mouth at bedtime. 30 tablet 3  . Multiple Vitamins-Minerals (YOUR LIFE MULTI MENS 50+) TABS Take by mouth.    . pantoprazole (PROTONIX) 20 MG tablet Take 1 tablet (20 mg total) by mouth 2 (two) times daily before a meal. 60 tablet 0  . polyethylene glycol powder (GLYCOLAX/MIRALAX) powder MIX ONE PACKET IN 8 OUNCES OF LIQUID AND DRINK D FOR MILD CONSTIPATION.  0   No current facility-administered medications for this visit.     Allergies as of 09/28/2018 - Review Complete 09/28/2018  Allergen Reaction Noted  . Penicillins Rash 03/19/2016    ROS:  General: Negative for anorexia, weight loss, fever, chills, fatigue, weakness. ENT: Negative for hoarseness, difficulty swallowing , nasal congestion. CV: Negative for chest pain, angina, palpitations, dyspnea on exertion, peripheral edema.  Respiratory: Negative for  dyspnea at rest, dyspnea on exertion, cough, sputum, wheezing.  GI: See history of present illness. GU:  Negative for dysuria, hematuria, urinary incontinence, urinary frequency, nocturnal urination.  Endo: Negative for unusual weight change.    Physical Examination:   BP 130/66   Pulse 92   Ht 5\' 11"  (1.803 m)   Wt 137 lb 3.2 oz (62.2 kg)   BMI 19.14 kg/m   General: Well-nourished, well-developed in no acute distress.  Eyes: No icterus. Conjunctivae pink. Mouth: Oropharyngeal mucosa moist and pink , no lesions erythema or exudate. Neck: Supple, Trachea midline Abdomen: Bowel sounds are normal, nontender, nondistended, no hepatosplenomegaly or masses, no abdominal bruits or hernia , no rebound or guarding.   Extremities: No lower extremity edema. No clubbing or deformities. Neuro: Alert and oriented x 3.  Grossly intact. Skin: Warm and dry, no jaundice.   Psych: Alert and cooperative, normal mood and affect.   Labs: CMP     Component Value Date/Time   NA 135 07/21/2018 1306   K 3.8 07/21/2018 1306   CL 103 07/21/2018 1306   CO2 21 (L) 07/21/2018 1306   GLUCOSE 101 (H) 07/21/2018 1306   BUN 9 07/21/2018 1306   CREATININE 0.90 07/21/2018 1306   CALCIUM 8.9 07/21/2018 1306   PROT 5.6 (L) 07/15/2018 0452   ALBUMIN 2.4 (L) 07/15/2018 0452   AST 24 07/15/2018 0452   ALT 25 07/15/2018 0452   ALKPHOS 76 07/15/2018 0452  BILITOT 0.8 07/15/2018 0452   GFRNONAA >60 07/21/2018 1306   GFRAA >60 07/21/2018 1306   Lab Results  Component Value Date   WBC 4.8 07/21/2018   HGB 12.9 (L) 07/21/2018   HCT 38.8 (L) 07/21/2018   MCV 90.2 07/21/2018   PLT 167 07/21/2018    Imaging Studies: No results found.  Assessment and Plan:   Ian Conley is a 67 y.o. y/o male here for follow-up of dysphagia which has completely resolved at this time, no further diarrhea, no abdominal pain, patient doing well  Patient has undergone extensive work-up with upper GI study, and CT scan,  none of which reported any concerning mass, defects or malignancy Since dysphagia has resolved, no indication for EGD at this time.  Patient tolerating both liquids and solids well.  Previously, when EGD was being considered, the issue was that patient has had a recent stroke and holding Plavix would put patient at risks given his recent stroke in July 2019.  Diagnostic EGD was being considered if Plavix cannot be held safely.  However, at this time his symptoms have completely resolved and therefore no indication to proceed with EGD at this time and EGD would have high risks of benefits at this time.  Patient has never had a screening colonoscopy No alarm symptoms present to indicate urgent colonoscopy Patient's sister plans on starting care with [facility] neurology as she is not happy with their current neurologist.  I have asked her to call us once they have the name of the neurologist they will be seeing and have an appointment with them so we can send them a letter to find out when it would be safe to hold his Plavix, to determine timeline for screening colonoscopy.  She verbalized understanding and states will call us back when they have this information.  PCP can consider alternative CRC screening methods in the meantime if one has not been done already  This note is being CCed to patient's PCP Dr. Clayborn Bigness with the above recommendations.  Follow-up in clinic in 3 to 6 months Dr [name]

## 2018-09-29 DIAGNOSIS — N390 Urinary tract infection, site not specified: Secondary | ICD-10-CM | POA: Diagnosis not present

## 2018-09-29 DIAGNOSIS — Z9181 History of falling: Secondary | ICD-10-CM | POA: Diagnosis not present

## 2018-09-29 DIAGNOSIS — I6901 Attention and concentration deficit following nontraumatic subarachnoid hemorrhage: Secondary | ICD-10-CM | POA: Diagnosis not present

## 2018-09-29 DIAGNOSIS — I10 Essential (primary) hypertension: Secondary | ICD-10-CM | POA: Diagnosis not present

## 2018-09-29 DIAGNOSIS — I69354 Hemiplegia and hemiparesis following cerebral infarction affecting left non-dominant side: Secondary | ICD-10-CM | POA: Diagnosis not present

## 2018-09-29 DIAGNOSIS — R1312 Dysphagia, oropharyngeal phase: Secondary | ICD-10-CM | POA: Diagnosis not present

## 2018-09-29 DIAGNOSIS — Z87891 Personal history of nicotine dependence: Secondary | ICD-10-CM | POA: Diagnosis not present

## 2018-09-29 DIAGNOSIS — I69391 Dysphagia following cerebral infarction: Secondary | ICD-10-CM | POA: Diagnosis not present

## 2018-09-29 DIAGNOSIS — M1611 Unilateral primary osteoarthritis, right hip: Secondary | ICD-10-CM | POA: Diagnosis not present

## 2018-09-29 DIAGNOSIS — E43 Unspecified severe protein-calorie malnutrition: Secondary | ICD-10-CM | POA: Diagnosis not present

## 2018-09-30 ENCOUNTER — Ambulatory Visit: Payer: Medicare Other | Admitting: Adult Health

## 2018-09-30 ENCOUNTER — Encounter: Payer: Self-pay | Admitting: Adult Health

## 2018-09-30 VITALS — BP 142/78 | HR 93 | Temp 98.6°F | Resp 16 | Ht 70.0 in | Wt 133.0 lb

## 2018-09-30 DIAGNOSIS — I639 Cerebral infarction, unspecified: Secondary | ICD-10-CM

## 2018-09-30 DIAGNOSIS — F0151 Vascular dementia with behavioral disturbance: Secondary | ICD-10-CM

## 2018-09-30 DIAGNOSIS — Z23 Encounter for immunization: Secondary | ICD-10-CM

## 2018-09-30 DIAGNOSIS — R300 Dysuria: Secondary | ICD-10-CM

## 2018-09-30 DIAGNOSIS — F01518 Vascular dementia, unspecified severity, with other behavioral disturbance: Secondary | ICD-10-CM

## 2018-09-30 DIAGNOSIS — G4709 Other insomnia: Secondary | ICD-10-CM

## 2018-09-30 DIAGNOSIS — I10 Essential (primary) hypertension: Secondary | ICD-10-CM | POA: Diagnosis not present

## 2018-09-30 LAB — POCT URINALYSIS DIPSTICK
BILIRUBIN UA: NEGATIVE
GLUCOSE UA: NEGATIVE
KETONES UA: NEGATIVE
Leukocytes, UA: NEGATIVE
Nitrite, UA: NEGATIVE
Protein, UA: POSITIVE — AB
RBC UA: NEGATIVE
SPEC GRAV UA: 1.01 (ref 1.010–1.025)
Urobilinogen, UA: 0.2 E.U./dL
pH, UA: 8.5 — AB (ref 5.0–8.0)

## 2018-09-30 NOTE — Progress Notes (Signed)
[facility] [place], [place] [address]  Internal MEDICINE  Office Visit Note  Patient Name: Ian Conley  [id number]  [id number]  Date of Service: 09/30/2018  Chief Complaint  Patient presents with  . Insomnia    not able to sleep   . Back Pain    lower back pain concerned with a uti     HPI Pt is here for a sick visit. He is here with his caregiver.  She reports the patient has confusion has increased lately.  He complains of not being able to sleep at night, and he has lower back pain. The patient states his back always hurts when the weather changes.  He has recently been started on Lorazepam BID as needed for anxiety.  He is currently taking 1/2 of  0.5mg  tablet morning and night.  His caregiver states he does well during the day, however he is having difficulty sleeping at night.   This could be due to a UTI, and sundowning.  Will get urine specimen. He does have a recent history of CVA, he has no other symptoms at this time.  He is sitting up, answering questions appropriately.           Current Medication:  Outpatient Encounter Medications as of 09/30/2018  Medication Sig  . atorvastatin (LIPITOR) 80 MG tablet Take 1 tablet (80 mg total) by mouth daily at 6 PM.  . dipyridamole-aspirin (AGGRENOX) 200-25 MG 12hr capsule Take 1 capsule by mouth 2 (two) times daily.  Marland Kitchen LORazepam (ATIVAN) 0.5 MG tablet Take 1/2 to 1 tablet po BID prn agitation.  . mirtazapine (REMERON) 7.5 MG tablet Take 1 tablet (7.5 mg total) by mouth at bedtime.  . Multiple Vitamins-Minerals (YOUR LIFE MULTI MENS 50+) TABS Take by mouth.  . pantoprazole (PROTONIX) 20 MG tablet Take 1 tablet (20 mg total) by mouth 2 (two) times daily before a meal.  . polyethylene glycol powder (GLYCOLAX/MIRALAX) powder MIX ONE PACKET IN 8 OUNCES OF LIQUID AND DRINK D FOR MILD CONSTIPATION.   No facility-administered encounter medications on file as of 09/30/2018.       Medical  History: Past Medical History:  Diagnosis Date  . Allergy   . Anemia   . Hypertension   . Pancreatitis   . Personal history of tobacco use, presenting hazards to health 03/25/2016  . Polycythemia   . Stroke [facility])    2008 9/11     Vital Signs: BP (!) 142/78   Pulse 93   Temp 98.6 F (37 C)   Resp 16   Ht 5\' 10"  (1.778 m)   Wt 133 lb (60.3 kg)   SpO2 97%   BMI 19.08 kg/m    Review of Systems  Constitutional: Negative.  Negative for chills, fatigue and unexpected weight change.  HENT: Negative.  Negative for congestion, rhinorrhea, sneezing and sore throat.   Eyes: Negative for redness.  Respiratory: Negative.  Negative for cough, chest tightness and shortness of breath.   Cardiovascular: Negative.  Negative for chest pain and palpitations.  Gastrointestinal: Negative.  Negative for abdominal pain, constipation, diarrhea, nausea and vomiting.  Endocrine: Negative.   Genitourinary: Negative.  Negative for dysuria and frequency.  Musculoskeletal: Negative.  Negative for arthralgias, back pain, joint swelling and neck pain.  Skin: Negative.  Negative for rash.  Allergic/Immunologic: Negative.   Neurological: Negative.  Negative for tremors and numbness.  Hematological: Negative for adenopathy. Does not bruise/bleed easily.  Psychiatric/Behavioral: Negative.  Negative for  behavioral problems, sleep disturbance and suicidal ideas. The patient is not nervous/anxious.     Physical Exam  Constitutional: He is oriented to person, place, and time. He appears well-developed and well-nourished. No distress.  HENT:  Head: Normocephalic and atraumatic.  Mouth/Throat: Oropharynx is clear and moist. No oropharyngeal exudate.  Eyes: Pupils are equal, round, and reactive to light. EOM are normal.  Neck: Normal range of motion. Neck supple. No JVD present. No tracheal deviation present. No thyromegaly present.  Cardiovascular: Normal rate, regular rhythm and normal heart sounds. Exam  reveals no gallop and no friction rub.  No murmur heard. Pulmonary/Chest: Effort normal and breath sounds normal. No respiratory distress. He has no wheezes. He has no rales. He exhibits no tenderness.  Abdominal: Soft. There is no tenderness. There is no guarding.  Musculoskeletal: Normal range of motion.  Lymphadenopathy:    He has no cervical adenopathy.  Neurological: He is alert and oriented to person, place, and time. No cranial nerve deficit.  Skin: Skin is warm and dry. He is not diaphoretic.  Psychiatric: He has a normal mood and affect. His behavior is normal. Judgment and thought content normal.  Nursing note and vitals reviewed.  Assessment/Plan: 1. Dementia, multiinfarct, with behavioral disturbance ([place]) Patient is alert and oriented x3 at this visit.  Caregiver reports increased confusion over the last week or so and is concerned for UTI.  Urine dipstick shows normal results except for protein.  Insomnia likely due to to his dementia will increase Lorazepam to 1 whole tablet at night and continue with half tablet in the morning.  2. Cerebrovascular accident (CVA), unspecified mechanism ([place]) Recent history of CVA.  Discussed cognitive changes in patient's post CVA with caregiver.  Patient's neuro assessment completely intact at this visit able to answer questions appropriately.  3. Essential hypertension Slightly elevated today.  Will continue to monitor.   4. Dysuria No UTI at this time on urine dip. - POCT Urinalysis Dipstick  5. Flu vaccine need - Flu Vaccine MDCK QUAD PF  6.Insomnia Increase Ativan to whole tablet at night and continue with half tablet in the morning.  General Counseling: Tomasz verbalizes understanding of the findings of todays visit and agrees with plan of treatment. I have discussed any further diagnostic evaluation that may be needed or ordered today. We also reviewed his medications today. he has been encouraged to call the office with any  questions or concerns that should arise related to todays visit.   Orders Placed This Encounter  Procedures  . Flu Vaccine MDCK QUAD PF  . POCT Urinalysis Dipstick    No orders of the defined types were placed in this encounter.   Time spent: 25 Minutes  This patient was seen by Orson Gear AGNP-C in Collaboration with Dr Lavera Guise as a part of collaborative care agreement.  Kendell Bane AGNP-C Internal Medicine

## 2018-10-03 DIAGNOSIS — N390 Urinary tract infection, site not specified: Secondary | ICD-10-CM | POA: Diagnosis not present

## 2018-10-03 DIAGNOSIS — Z87891 Personal history of nicotine dependence: Secondary | ICD-10-CM | POA: Diagnosis not present

## 2018-10-03 DIAGNOSIS — E43 Unspecified severe protein-calorie malnutrition: Secondary | ICD-10-CM | POA: Diagnosis not present

## 2018-10-03 DIAGNOSIS — M1611 Unilateral primary osteoarthritis, right hip: Secondary | ICD-10-CM | POA: Diagnosis not present

## 2018-10-03 DIAGNOSIS — Z9181 History of falling: Secondary | ICD-10-CM | POA: Diagnosis not present

## 2018-10-03 DIAGNOSIS — I10 Essential (primary) hypertension: Secondary | ICD-10-CM | POA: Diagnosis not present

## 2018-10-03 DIAGNOSIS — I69391 Dysphagia following cerebral infarction: Secondary | ICD-10-CM | POA: Diagnosis not present

## 2018-10-03 DIAGNOSIS — R1312 Dysphagia, oropharyngeal phase: Secondary | ICD-10-CM | POA: Diagnosis not present

## 2018-10-03 DIAGNOSIS — I6901 Attention and concentration deficit following nontraumatic subarachnoid hemorrhage: Secondary | ICD-10-CM | POA: Diagnosis not present

## 2018-10-03 DIAGNOSIS — I69354 Hemiplegia and hemiparesis following cerebral infarction affecting left non-dominant side: Secondary | ICD-10-CM | POA: Diagnosis not present

## 2018-10-06 DIAGNOSIS — E43 Unspecified severe protein-calorie malnutrition: Secondary | ICD-10-CM | POA: Diagnosis not present

## 2018-10-06 DIAGNOSIS — R1312 Dysphagia, oropharyngeal phase: Secondary | ICD-10-CM | POA: Diagnosis not present

## 2018-10-06 DIAGNOSIS — Z87891 Personal history of nicotine dependence: Secondary | ICD-10-CM | POA: Diagnosis not present

## 2018-10-06 DIAGNOSIS — I69391 Dysphagia following cerebral infarction: Secondary | ICD-10-CM | POA: Diagnosis not present

## 2018-10-06 DIAGNOSIS — I6901 Attention and concentration deficit following nontraumatic subarachnoid hemorrhage: Secondary | ICD-10-CM | POA: Diagnosis not present

## 2018-10-06 DIAGNOSIS — I10 Essential (primary) hypertension: Secondary | ICD-10-CM | POA: Diagnosis not present

## 2018-10-06 DIAGNOSIS — N390 Urinary tract infection, site not specified: Secondary | ICD-10-CM | POA: Diagnosis not present

## 2018-10-06 DIAGNOSIS — M1611 Unilateral primary osteoarthritis, right hip: Secondary | ICD-10-CM | POA: Diagnosis not present

## 2018-10-06 DIAGNOSIS — I69354 Hemiplegia and hemiparesis following cerebral infarction affecting left non-dominant side: Secondary | ICD-10-CM | POA: Diagnosis not present

## 2018-10-06 DIAGNOSIS — Z9181 History of falling: Secondary | ICD-10-CM | POA: Diagnosis not present

## 2018-10-07 ENCOUNTER — Telehealth: Payer: Self-pay

## 2018-10-07 ENCOUNTER — Other Ambulatory Visit: Payer: Self-pay | Admitting: Adult Health

## 2018-10-07 DIAGNOSIS — F01518 Vascular dementia, unspecified severity, with other behavioral disturbance: Secondary | ICD-10-CM

## 2018-10-07 DIAGNOSIS — F0151 Vascular dementia with behavioral disturbance: Secondary | ICD-10-CM

## 2018-10-07 MED ORDER — LORAZEPAM 0.5 MG PO TABS: ORAL_TABLET | ORAL | 1 refills | Status: DC

## 2018-10-07 NOTE — Telephone Encounter (Signed)
Done, thank you!

## 2018-10-07 NOTE — Progress Notes (Signed)
Refilled patients Lorazepam. #60, 0 Refills.

## 2018-10-07 NOTE — Telephone Encounter (Signed)
PT ADVISED WE SEND MED

## 2018-10-14 ENCOUNTER — Other Ambulatory Visit: Payer: Self-pay | Admitting: Neurology

## 2018-10-14 DIAGNOSIS — I6789 Other cerebrovascular disease: Secondary | ICD-10-CM | POA: Diagnosis not present

## 2018-10-14 DIAGNOSIS — R413 Other amnesia: Secondary | ICD-10-CM

## 2018-10-14 DIAGNOSIS — I6782 Cerebral ischemia: Secondary | ICD-10-CM | POA: Diagnosis not present

## 2018-10-20 ENCOUNTER — Ambulatory Visit (INDEPENDENT_AMBULATORY_CARE_PROVIDER_SITE_OTHER): Payer: Medicare Other | Admitting: Nurse Practitioner

## 2018-10-20 ENCOUNTER — Encounter: Payer: Self-pay | Admitting: Nurse Practitioner

## 2018-10-20 VITALS — BP 124/68 | HR 90 | Resp 16 | Ht 71.0 in | Wt 132.2 lb

## 2018-10-20 DIAGNOSIS — F321 Major depressive disorder, single episode, moderate: Secondary | ICD-10-CM | POA: Diagnosis not present

## 2018-10-20 DIAGNOSIS — R300 Dysuria: Secondary | ICD-10-CM | POA: Diagnosis not present

## 2018-10-20 DIAGNOSIS — I10 Essential (primary) hypertension: Secondary | ICD-10-CM | POA: Diagnosis not present

## 2018-10-20 DIAGNOSIS — Z0001 Encounter for general adult medical examination with abnormal findings: Secondary | ICD-10-CM

## 2018-10-20 DIAGNOSIS — I679 Cerebrovascular disease, unspecified: Secondary | ICD-10-CM | POA: Diagnosis not present

## 2018-10-20 NOTE — Progress Notes (Signed)
[facility] [place], [place] [address]  Internal MEDICINE  Office Visit Note  Patient Name: Ian Conley  [id number]  [id number]  Date of Service: 10/20/2018   Pt is here for routine health maintenance examination  Chief Complaint  Patient presents with  . Medical Management of Chronic Issues    6 month follow up  . Hypertension     Patient is here for routine health maintenance exam. He has made significant improvement in strength on the left side since I last saw him in 07/2018. He is now walking with a cane and without assistance. Cognitive ability appears to have improved as well. Interacting appropriately and answering questions and conversing appropriately. Blood pressure is well controlled. There is some residual left sided weakness and a little speech impairment. Physical and occupational therapy has been completed. He does continue to do some of the exercises he was taught, at home on his own. He is in good spirits. He has no concerns or complaints to discuss.     Current Medication: Outpatient Encounter Medications as of 10/20/2018  Medication Sig  . atorvastatin (LIPITOR) 80 MG tablet Take 1 tablet (80 mg total) by mouth daily at 6 PM.  . dipyridamole-aspirin (AGGRENOX) 200-25 MG 12hr capsule Take 1 capsule by mouth 2 (two) times daily.  Marland Kitchen LORazepam (ATIVAN) 0.5 MG tablet Take 1/2 to 1 tablet po BID prn agitation.  . mirtazapine (REMERON) 7.5 MG tablet Take 1 tablet (7.5 mg total) by mouth at bedtime.  . Multiple Vitamins-Minerals (YOUR LIFE MULTI MENS 50+) TABS Take by mouth.  . pantoprazole (PROTONIX) 20 MG tablet Take 1 tablet (20 mg total) by mouth 2 (two) times daily before a meal.  . polyethylene glycol powder (GLYCOLAX/MIRALAX) powder MIX ONE PACKET IN 8 OUNCES OF LIQUID AND DRINK D FOR MILD CONSTIPATION.   No facility-administered encounter medications on file as of 10/20/2018.     Surgical History: History reviewed. No pertinent  surgical history.  Medical History: Past Medical History:  Diagnosis Date  . Allergy   . Anemia   . Hypertension   . Pancreatitis   . Personal history of tobacco use, presenting hazards to health 03/25/2016  . Polycythemia   . Stroke [facility])    2008 9/11    Family History: Family History  Problem Relation Age of Onset  . Diabetes Mother   . Heart failure Mother   . Cancer Father        Throat   . Stroke Father   . Hypertension Father   . HIV Brother   . Thyroid disease Sister        thyroid cancer  . Hypertension Sister        both sisters      Review of Systems  Constitutional: Negative for activity change, appetite change, chills, fatigue and unexpected weight change.  HENT: Negative for congestion, rhinorrhea, sinus pressure, sinus pain, sneezing and sore throat.   Eyes: Negative.  Negative for redness.  Respiratory: Negative for cough, chest tightness, shortness of breath and wheezing.   Cardiovascular: Negative for chest pain and palpitations.  Gastrointestinal: Negative for abdominal pain, constipation, diarrhea, nausea and vomiting.  Endocrine: Negative for cold intolerance, heat intolerance, polydipsia, polyphagia and polyuria.  Genitourinary: Negative for dysuria, flank pain and frequency.       Treatment for UTI part of hospital stay for stroke   Musculoskeletal: Positive for back pain and gait problem. Negative for arthralgias, joint swelling and neck pain.  Skin: Negative for rash.  Allergic/Immunologic: Negative for environmental allergies.  Neurological: Positive for facial asymmetry and weakness. Negative for tremors, speech difficulty and numbness.       The left sided weakness is persistent but improved since his most recent visit. Walking with a cane now and without assistance.   Hematological: Negative for adenopathy. Does not bruise/bleed easily.  Psychiatric/Behavioral: Negative.  Negative for behavioral problems, sleep disturbance and suicidal ideas.  The patient is not nervous/anxious.      Today's Vitals   10/20/18 1015  BP: 124/68  Pulse: 90  Resp: 16  SpO2: 97%  Weight: 132 lb 3.2 oz (60 kg)  Height: 5\' 11"  (1.803 m)    Physical Exam  Constitutional: He is oriented to person, place, and time. He appears well-developed and well-nourished. No distress.  HENT:  Head: Normocephalic and atraumatic.  Nose: Nose normal.  Mouth/Throat: Oropharynx is clear and moist. No oropharyngeal exudate.  Eyes: Pupils are equal, round, and reactive to light. Conjunctivae and EOM are normal.  Neck: Normal range of motion. Neck supple. No JVD present. No tracheal deviation present. No thyromegaly present.  Cardiovascular: Normal rate, regular rhythm and intact distal pulses. Exam reveals no gallop and no friction rub.  Murmur heard.  Systolic murmur is present with a grade of 2/6. Pulmonary/Chest: Effort normal and breath sounds normal. No respiratory distress. He has no wheezes. He has no rales. He exhibits no tenderness.  Abdominal: Soft. Bowel sounds are normal. There is no tenderness. There is no guarding.  Musculoskeletal: Normal range of motion.  Left upper and lower extremity weakness. Has improved since his last visit. Walking with a cane rather than walker. No longer needing assistance.   Lymphadenopathy:    He has no cervical adenopathy.  Neurological: He is alert and oriented to person, place, and time. No cranial nerve deficit.  Left upper and lower extremity weakness. Has improved since his last visit. Walking with a cane rather than walker. No longer needing assistance. Memory, attitude, and cognitive abilities have all improved since I last saw him.   Skin: Skin is warm and dry. He is not diaphoretic.  Psychiatric: His behavior is normal. Judgment and thought content normal. His speech is delayed. He exhibits a depressed mood. He exhibits abnormal recent memory.  Nursing note and vitals reviewed.  Depression screen [facility] 2/9  10/20/2018 08/16/2018 08/02/2018 04/14/2018  Decreased Interest 0 3 0 0  Down, Depressed, Hopeless 0 3 0 0  PHQ - 2 Score 0 6 0 0  Altered sleeping - 0 - -  Tired, decreased energy - 3 - -  Change in appetite - (No Data) - -  Feeling bad or failure about yourself  - 0 - -  Trouble concentrating - 3 - -  Moving slowly or fidgety/restless - 1 - -  Suicidal thoughts - 0 - -  PHQ-9 Score - 13 - -    Functional Status Survey: Is the patient deaf or have difficulty hearing?: No Does the patient have difficulty seeing, even when wearing glasses/contacts?: No Does the patient have difficulty concentrating, remembering, or making decisions?: Yes(memory sometimes) Does the patient have difficulty walking or climbing stairs?: Yes Does the patient have difficulty dressing or bathing?: No Does the patient have difficulty doing errands alone such as visiting a doctor's office or shopping?: Yes  MMSE - Mini Mental State Exam 10/20/2018  Orientation to time 5  Orientation to Place 5  Registration 3  Attention/ Calculation 5  Recall  3  Language- name 2 objects 2  Language- repeat 1  Language- follow 3 step command 3  Language- read & follow direction 1  Write a sentence 1  Copy design 1  Total score 30    Fall Risk  10/20/2018 08/16/2018 08/02/2018 04/14/2018  Falls in the past year? No Yes No Yes  Comment - fell in bathroom at nursing facility, head laceration - -  Number falls in past yr: - 1 - 1  Injury with Fall? - Yes - No     LABS: Recent Results (from the past 2160 hour(s))  CULTURE, URINE COMPREHENSIVE     Status: Abnormal   Collection Time: 08/16/18 12:00 AM  Result Value Ref Range   Urine Culture, Comprehensive Final report (A)    Organism ID, Bacteria Comment (A)     Comment: Beta hemolytic Streptococcus, group B 6,000  Colonies/mL Penicillin and ampicillin are drugs of choice for treatment of beta-hemolytic streptococcal infections. Susceptibility testing of penicillins  and other beta-lactam agents approved by the FDA for treatment of beta-hemolytic streptococcal infections need not be performed routinely because nonsusceptible isolates are extremely rare in any beta-hemolytic streptococcus and have not been reported for Streptococcus pyogenes (group A). (CLSI)   POCT Urinalysis Dipstick     Status: Abnormal   Collection Time: 08/16/18  4:28 PM  Result Value Ref Range   Color, UA     Clarity, UA     Glucose, UA Negative Negative   Bilirubin, UA negative    Ketones, UA negative    Spec Grav, UA 1.020 1.010 - 1.025   Blood, UA negative    pH, UA 6.0 5.0 - 8.0   Protein, UA Positive (A) Negative   Urobilinogen, UA 0.2 0.2 or 1.0 E.U./dL   Nitrite, UA negative    Leukocytes, UA Trace (A) Negative   Appearance     Odor    POCT Urinalysis Dipstick     Status: Abnormal   Collection Time: 09/30/18 11:29 AM  Result Value Ref Range   Color, UA     Clarity, UA     Glucose, UA Negative Negative   Bilirubin, UA negative    Ketones, UA negative    Spec Grav, UA 1.010 1.010 - 1.025   Blood, UA negative    pH, UA 8.5 (A) 5.0 - 8.0   Protein, UA Positive (A) Negative   Urobilinogen, UA 0.2 0.2 or 1.0 E.U./dL   Nitrite, UA negative    Leukocytes, UA Negative Negative   Appearance     Odor       Assessment/Plan: 1. Encounter for general adult medical examination with abnormal findings Annual health maintenance exam today  2. Essential hypertension bp stable. Continue meds as prescribed   3. Cerebrovascular disease Improving. Encouraged the patient to do home exercises demonstrated by home health to continue to regain strength in left side.   4. Episode of moderate major depression (HCC) Improved. Continue medications as prescribed   5. Dysuria - UA/M w/rflx Culture, Routine  General Counseling: Geovanni verbalizes understanding of the findings of todays visit and agrees with plan of treatment. I have discussed any further diagnostic evaluation  that may be needed or ordered today. We also reviewed his medications today. he has been encouraged to call the office with any questions or concerns that should arise related to todays visit.    Counseling:  Hypertension Counseling:   The following hypertensive lifestyle modification were recommended and discussed:  1. Limiting alcohol intake  to less than 1 oz/day of ethanol:(24 oz of beer or 8 oz of wine or 2 oz of 100-proof whiskey). 2. Take baby ASA 81 mg daily. 3. Importance of regular aerobic exercise and losing weight. 4. Reduce dietary saturated fat and cholesterol intake for overall cardiovascular health. 5. Maintaining adequate dietary potassium, calcium, and magnesium intake. 6. Regular monitoring of the blood pressure. 7. Reduce sodium intake to less than 100 mmol/day (less than 2.3 gm of sodium or less than 6 gm of sodium choride)   This patient was seen by [place] with Dr Lavera Guise as a part of collaborative care agreement  Orders Placed This Encounter  Procedures  . UA/M w/rflx Culture, Routine     Time spent: [place], MD  Internal Medicine

## 2018-10-21 LAB — MICROSCOPIC EXAMINATION: EPITHELIAL CELLS (NON RENAL): NONE SEEN /HPF (ref 0–10)

## 2018-10-21 LAB — UA/M W/RFLX CULTURE, ROUTINE
Bilirubin, UA: NEGATIVE
Glucose, UA: NEGATIVE
Ketones, UA: NEGATIVE
Leukocytes, UA: NEGATIVE
Nitrite, UA: NEGATIVE
PH UA: 7.5 (ref 5.0–7.5)
PROTEIN UA: NEGATIVE
RBC, UA: NEGATIVE
SPEC GRAV UA: 1.009 (ref 1.005–1.030)
Urobilinogen, Ur: 0.2 mg/dL (ref 0.2–1.0)

## 2018-11-03 ENCOUNTER — Ambulatory Visit
Admission: RE | Admit: 2018-11-03 | Discharge: 2018-11-03 | Disposition: A | Discharge status: Home / Self Care | Payer: Medicare Other | Source: Ambulatory Visit | Attending: Neurology | Admitting: Neurology

## 2018-11-03 DIAGNOSIS — R413 Other amnesia: Secondary | ICD-10-CM | POA: Insufficient documentation

## 2018-11-03 DIAGNOSIS — I6789 Other cerebrovascular disease: Secondary | ICD-10-CM | POA: Diagnosis not present

## 2018-11-03 DIAGNOSIS — R269 Unspecified abnormalities of gait and mobility: Secondary | ICD-10-CM | POA: Diagnosis not present

## 2018-11-10 DIAGNOSIS — R413 Other amnesia: Secondary | ICD-10-CM | POA: Diagnosis not present

## 2018-11-21 ENCOUNTER — Ambulatory Visit: Payer: Medicare Other | Admitting: Podiatry

## 2018-12-08 ENCOUNTER — Inpatient Hospital Stay: Payer: Medicare Other | Admitting: Hematology and Oncology

## 2018-12-08 ENCOUNTER — Other Ambulatory Visit: Payer: Self-pay

## 2018-12-08 ENCOUNTER — Inpatient Hospital Stay: Payer: Medicare Other | Attending: Hematology and Oncology

## 2018-12-08 VITALS — BP 130/74 | HR 85 | Temp 97.6°F | Resp 18 | Wt 131.2 lb

## 2018-12-08 DIAGNOSIS — Z8673 Personal history of transient ischemic attack (TIA), and cerebral infarction without residual deficits: Secondary | ICD-10-CM | POA: Diagnosis not present

## 2018-12-08 DIAGNOSIS — K222 Esophageal obstruction: Secondary | ICD-10-CM

## 2018-12-08 DIAGNOSIS — D751 Secondary polycythemia: Secondary | ICD-10-CM | POA: Insufficient documentation

## 2018-12-08 DIAGNOSIS — K3189 Other diseases of stomach and duodenum: Secondary | ICD-10-CM

## 2018-12-08 DIAGNOSIS — F17210 Nicotine dependence, cigarettes, uncomplicated: Secondary | ICD-10-CM | POA: Diagnosis not present

## 2018-12-08 DIAGNOSIS — R634 Abnormal weight loss: Secondary | ICD-10-CM

## 2018-12-08 LAB — COMPREHENSIVE METABOLIC PANEL
ALT: 19 U/L (ref 0–44)
AST: 26 U/L (ref 15–41)
Albumin: 3.6 g/dL (ref 3.5–5.0)
Alkaline Phosphatase: 88 U/L (ref 38–126)
Anion gap: 8 (ref 5–15)
BUN: 5 mg/dL — ABNORMAL LOW (ref 8–23)
CO2: 27 mmol/L (ref 22–32)
Calcium: 9.3 mg/dL (ref 8.9–10.3)
Chloride: 109 mmol/L (ref 98–111)
Creatinine, Ser: 1.01 mg/dL (ref 0.61–1.24)
GFR calc Af Amer: >60 mL/min (ref >60)
GFR calc non Af Amer: >60 mL/min (ref >60)
Glucose, Bld: 136 mg/dL — ABNORMAL HIGH (ref 70–99)
Potassium: 3.5 mmol/L (ref 3.5–5.1)
Sodium: 144 mmol/L (ref 135–145)
Total Bilirubin: 0.5 mg/dL (ref 0.3–1.2)
Total Protein: 6.9 g/dL (ref 6.5–8.1)

## 2018-12-08 LAB — CBC WITH DIFFERENTIAL/PLATELET
Abs Immature Granulocytes: 0 10*3/uL (ref 0.00–0.07)
Basophils Absolute: 0 10*3/uL (ref 0.0–0.1)
Basophils Relative: 0 %
Eosinophils Absolute: 0 10*3/uL (ref 0.0–0.5)
Eosinophils Relative: 1 %
HCT: 41.7 % (ref 39.0–52.0)
Hemoglobin: 13.3 g/dL (ref 13.0–17.0)
Immature Granulocytes: 0 %
Lymphocytes Relative: 33 %
Lymphs Abs: 1.5 10*3/uL (ref 0.7–4.0)
MCH: 28.4 pg (ref 26.0–34.0)
MCHC: 31.9 g/dL (ref 30.0–36.0)
MCV: 89.1 fL (ref 80.0–100.0)
Monocytes Absolute: 0.3 10*3/uL (ref 0.1–1.0)
Monocytes Relative: 7 %
Neutro Abs: 2.7 10*3/uL (ref 1.7–7.7)
Neutrophils Relative %: 59 %
Platelets: 163 10*3/uL (ref 150–400)
RBC: 4.68 MIL/uL (ref 4.22–5.81)
RDW: 14 % (ref 11.5–15.5)
WBC: 4.5 10*3/uL (ref 4.0–10.5)
nRBC: 0 % (ref 0.0–0.2)

## 2018-12-08 NOTE — Progress Notes (Signed)
[facility] day:  12/08/18   Chief Complaint: Ian Conley is a 67 y.o. 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  HPI:   The patient was last seen in the hematology clinic on 08/08/2018.  At that time, he was eating better and gaining weight.  Exam was stable.  Chest and abdomen CT on 08/09/2018 revealed moderate to advanced centrilobular and paraseptal emphysema.  There was biapical and bibasilar scarring.  There was scattered aortic atherosclerosis. No aneurysm. There was no acute cardiopulmonary disease.  There was mild fatty infiltration of the liver.  There was no gastric abnormality by CT.  There was no acute findings in the abdomen.  During the interim, patient is doing well. He is back home at this point with his son. Energy level has improved, but notes that it is "not great". Patient has completed his physical therapy at this point, as he has met his intended goals.  Patient continues to complain of atraumatic dental malocclusions. Patient states, "more teeth are just falling out". Patient denies that he has experienced any B symptoms. He denies any interval infections.   Patient is no longer being seen by [facility] neurology. Family describes "a problem" with the NP. Family upset because patient was told that he could drink alcohol and drive a care at this point. Family has "taken it upon themselves" to transition his care to a neurology practice in [place].   Patient advises that he maintains an adequate appetite. He is eating well. Weight today is 131 lb 3.2 oz (59.5 kg), which compared to his last visit to the clinic, represents a 3 pound increase.   Patient denies pain in the clinic today. He continues to smoke 1 pack of cigarettes a day. He is no longer drinking alcohol. Patient states, "ya'll wont let me have no beer".    Past Medical History:  Diagnosis Date  . Allergy   . Anemia   .  Hypertension   . Pancreatitis   . Personal history of tobacco use, presenting hazards to health 03/25/2016  . Polycythemia   . Stroke [facility])    2008 9/11    No past surgical history on file.  Family History  Problem Relation Age of Onset  . Diabetes Mother   . Heart failure Mother   . Cancer Father        Throat   . Stroke Father   . Hypertension Father   . HIV Brother   . Thyroid disease Sister        thyroid cancer  . Hypertension Sister        both sisters    Social History:  reports that he has been smoking cigarettes. He has a 45.00 pack-year smoking history. He has never used smokeless tobacco. He reports previous alcohol use. He reports that he does not use drugs.  He started smoking between the age of [age].  He smoked 1 pack a day for 48 years.  He is smoking 1 pack a day.  He drinks 2- 24 ounce beers/day.  He was in [employer] for 3 years.  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  He lives in [place].  The patient accompanied by [name]  today.  Allergies:  Allergies  Allergen Reactions  . Penicillins Rash    Has patient had a PCN reaction causing immediate rash, facial/tongue/throat swelling, SOB or lightheadedness with hypotension:  Yes Has patient had a PCN reaction causing severe rash involving mucus membranes or skin necrosis: Unknown Has patient had a PCN reaction that required hospitalization: No Has patient had a PCN reaction occurring within the last 10 years: No If all of the above answers are "NO", then may proceed with Cephalosporin use.    Current Medications: Current Outpatient Medications  Medication Sig Dispense Refill  . atorvastatin (LIPITOR) 80 MG tablet Take 1 tablet (80 mg total) by mouth daily at 6 PM. 90 tablet 1  . dipyridamole-aspirin (AGGRENOX) 200-25 MG 12hr capsule Take 1 capsule by mouth 2 (two) times daily. 180 capsule 1  . LORazepam (ATIVAN) 0.5 MG tablet Take 1/2 to 1 tablet po BID prn agitation. 60 tablet 1  .  mirtazapine (REMERON) 7.5 MG tablet Take 1 tablet (7.5 mg total) by mouth at bedtime. 30 tablet 3  . Multiple Vitamins-Minerals (YOUR LIFE MULTI MENS 50+) TABS Take by mouth.    . polyethylene glycol powder (GLYCOLAX/MIRALAX) powder MIX ONE PACKET IN 8 OUNCES OF LIQUID AND DRINK D FOR MILD CONSTIPATION.  0  . pantoprazole (PROTONIX) 20 MG tablet Take 1 tablet (20 mg total) by mouth 2 (two) times daily before a meal. (Patient not taking: Reported on 12/08/2018) 60 tablet 0   No current facility-administered medications for this visit.     Review of Systems  Constitutional: Negative for chills, diaphoresis, fever, malaise/fatigue and weight loss (up 3 pounds).       Energy "better, not great".  HENT: Negative.  Negative for congestion, ear discharge, ear pain, nosebleeds, sinus pain and sore throat.        Teeth falling out.  Eyes: Negative.  Negative for blurred vision, double vision, photophobia, pain, discharge and redness.  Respiratory: Negative.  Negative for cough, hemoptysis, sputum production and shortness of breath.   Cardiovascular: Negative.  Negative for chest pain, palpitations, orthopnea, leg swelling and PND.  Gastrointestinal: Negative.  Negative for abdominal pain, blood in stool, constipation, diarrhea, heartburn, melena, nausea and vomiting.       Eating better.  Genitourinary: Negative.  Negative for dysuria, frequency, hematuria and urgency.  Musculoskeletal: Negative.  Negative for back pain, falls, joint pain, myalgias and neck pain.  Skin: Negative for itching and rash.  Neurological: Positive for weakness (generalized, completed physical therapy). Negative for dizziness, tremors, speech change, focal weakness, seizures and headaches.       S/p CVA.  Endo/Heme/Allergies: Negative.  Does not bruise/bleed easily.  Psychiatric/Behavioral: Positive for memory loss. Negative for depression. The patient is not nervous/anxious and does not have insomnia.   All other systems  reviewed and are negative.  Performance status (ECOG): 2  Vital Signs BP 130/74 (BP Location: Left Arm, Patient Position: Sitting)   Pulse 85   Temp 97.6 F (36.4 C) (Tympanic)   Resp 18   Wt 131 lb 3.2 oz (59.5 kg)   BMI 18.30 kg/m   Physical Exam  Constitutional: He is oriented to person, place, and time.  Thin gentleman sitting comfortably in the exam room in no acute distress.  HENT:  Head: Normocephalic and atraumatic.  Mouth/Throat: Oropharynx is clear and moist. Abnormal dentition (poor dentition). No oropharyngeal exudate.  Wearing a black cap.  Gray hair and beard.  Eyes: Pupils are equal, round, and reactive to light. Conjunctivae and EOM are normal. No scleral icterus.  Glasses. Brown eyes.   Neck: Normal range of motion. Neck supple. No JVD present.  Cardiovascular: Normal rate, regular rhythm and normal  heart sounds. Exam reveals no gallop and no friction rub.  No murmur heard. Pulmonary/Chest: Effort normal and breath sounds normal. No respiratory distress. He has no wheezes. He has no rales.  Abdominal: Soft. Bowel sounds are normal. He exhibits no distension and no mass. There is no abdominal tenderness. There is no rebound and no guarding.  Musculoskeletal: Normal range of motion.        General: No tenderness.  Lymphadenopathy:    He has no cervical adenopathy.    He has no axillary adenopathy.       Right: No supraclavicular adenopathy present.       Left: No supraclavicular adenopathy present.  Neurological: He is alert and oriented to person, place, and time.  Skin: Skin is warm. No rash noted. He is not diaphoretic. No erythema.  Psychiatric: Mood and affect normal.  Nursing note and vitals reviewed.   Orders Only on 12/08/2018  Component Date Value Ref Range Status  . WBC 12/08/2018 4.5  4.0 - 10.5 K/uL Final  . RBC 12/08/2018 4.68  4.22 - 5.81 MIL/uL Final  . Hemoglobin 12/08/2018 13.3  13.0 - 17.0 g/dL Final  . HCT 12/08/2018 41.7  39.0 - 52.0 %  Final  . MCV 12/08/2018 89.1  80.0 - 100.0 fL Final  . MCH 12/08/2018 28.4  26.0 - 34.0 pg Final  . MCHC 12/08/2018 31.9  30.0 - 36.0 g/dL Final  . RDW 12/08/2018 14.0  11.5 - 15.5 % Final  . Platelets 12/08/2018 163  150 - 400 K/uL Final  . nRBC 12/08/2018 0.0  0.0 - 0.2 % Final  . Neutrophils Relative % 12/08/2018 59  % Final  . Neutro Abs 12/08/2018 2.7  1.7 - 7.7 K/uL Final  . Lymphocytes Relative 12/08/2018 33  % Final  . Lymphs Abs 12/08/2018 1.5  0.7 - 4.0 K/uL Final  . Monocytes Relative 12/08/2018 7  % Final  . Monocytes Absolute 12/08/2018 0.3  0.1 - 1.0 K/uL Final  . Eosinophils Relative 12/08/2018 1  % Final  . Eosinophils Absolute 12/08/2018 0.0  0.0 - 0.5 K/uL Final  . Basophils Relative 12/08/2018 0  % Final  . Basophils Absolute 12/08/2018 0.0  0.0 - 0.1 K/uL Final  . Immature Granulocytes 12/08/2018 0  % Final  . Abs Immature Granulocytes 12/08/2018 0.00  0.00 - 0.07 K/uL Final   Performed at [facility], [address] [place],  [address]  . Sodium 12/08/2018 144  135 - 145 mmol/L Final  . Potassium 12/08/2018 3.5  3.5 - 5.1 mmol/L Final  . Chloride 12/08/2018 109  98 - 111 mmol/L Final  . CO2 12/08/2018 27  22 - 32 mmol/L Final  . Glucose, Bld 12/08/2018 136* 70 - 99 mg/dL Final  . BUN 12/08/2018 5* 8 - 23 mg/dL Final  . Creatinine, Ser 12/08/2018 1.01  0.61 - 1.24 mg/dL Final  . Calcium 12/08/2018 9.3  8.9 - 10.3 mg/dL Final  . Total Protein 12/08/2018 6.9  6.5 - 8.1 g/dL Final  . Albumin 12/08/2018 3.6  3.5 - 5.0 g/dL Final  . AST 12/08/2018 26  15 - 41 U/L Final  . ALT 12/08/2018 19  0 - 44 U/L Final  . Alkaline Phosphatase 12/08/2018 88  38 - 126 U/L Final  . Total Bilirubin 12/08/2018 0.5  0.3 - 1.2 mg/dL Final  . GFR calc non Af Amer 12/08/2018 >60  >60 mL/min Final  . GFR calc Af Amer 12/08/2018 >60  >60 mL/min Final  .  Anion gap 12/08/2018 8  5 - 15 Final   Performed at [facility], [address] [place], [place] [address]     Assessment:  Ian Conley is a 67 y.o. 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  He has a 48 pack year smoking history.  He denies any cardiac disease, sleep apnea, or testosterone use.  He had a CVA on 09/01/2007 and 07/13/2018.  Work-up on 03/19/2016 revealed a hematocrit of 57.3, hemoglobin 19.5, MCV 91.5, platelets 144,000, and WBC 5000.  Erythropoietin level was 3.0 (2.6-18.5).  Repeat epo level was 6.8 on 05/05/2016.  JAK2 was negative for V617F and exon 12.  Ferritin was 181.  Iron studies revealed a saturation of 41% and a TIBC of 333.  Low dose spiral chest CT scan on 03/25/2016 was negative.  There was notation of a thickened area in the distal stomach or pyloric region.  Chest and abdomen CT on 08/09/2018 revealed moderate to advanced centrilobular and paraseptal emphysema.  There was biapical and bibasilar scarring.  There was scattered aortic atherosclerosis. No aneurysm. There was no acute cardiopulmonary disease.  There was mild fatty infiltration of the liver.  There was no gastric abnormality by CT.  There was no acute findings in the abdomen.  He has an elevated alkaline phosphatase (139).  PSA was 0.4.  He has never had a colonoscopy.  He undergoes small volume phlebotomy (250-300 cc) if his hematocrit is > 52.  Last phlebotomy was 08/06/2016.  Symptomatically, his energy level has improved.  He is gaining weight.  He has completed physical therapy.  Hemoglobin is 13.3.  Plan: 1. Labs today:  CBC with diff, CMP. 2. Secondary polycythemia  Hematocrit 41.7.  Hemoglobin 13.3.  No indication for phlebotomy.  Patient continues to smoke 1 pack per day.  Encourage smoking cessation. 3.   GI recommendations  Initial concern raised for weight loss and lack of need for phlebotomies.  Endoscopy deferred by GI secondary to recent CVA.  UGI revealed mild relative smooth narrowing of the distal esophagus.  Chest and abdomen CT on 08/09/2018 personally  reviewed.  Agree with radiology.  No evidence of malignancy. 4.   Weight loss   Patient gaining weight.   Dental issues continue to be problematic. 5.   Post CVA deconditioning Patient completed physical therapy. 6.   RTC in 4 months for MD assessment, labs (CBC with diff, CMP), +/- phlebotomy.   Honor Loh, NP  12/08/2018, 3:24 PM   I saw and evaluated the patient, participating in the key portions of the service and reviewing pertinent diagnostic studies and records.  I reviewed the nurse practitioner's note and agree with the findings and the plan.  The assessment and plan were discussed with the patient.  A few questions were asked by the patient and answered.   Nolon Stalls, MD 12/08/2018,3:24 PM

## 2018-12-08 NOTE — Progress Notes (Signed)
Here for follow up. 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

## 2018-12-12 ENCOUNTER — Other Ambulatory Visit: Payer: Self-pay | Admitting: Nurse Practitioner

## 2018-12-12 DIAGNOSIS — F01518 Vascular dementia, unspecified severity, with other behavioral disturbance: Secondary | ICD-10-CM

## 2018-12-12 DIAGNOSIS — F0151 Vascular dementia with behavioral disturbance: Secondary | ICD-10-CM

## 2018-12-12 MED ORDER — LORAZEPAM 0.5 MG PO TABS: ORAL_TABLET | ORAL | 1 refills | Status: DC

## 2018-12-12 NOTE — Progress Notes (Signed)
Refilled lorazepam 0.5mg , taking 1/2 to 1 tablet twice daily per pharmacy request.

## 2018-12-19 DIAGNOSIS — R413 Other amnesia: Secondary | ICD-10-CM | POA: Diagnosis not present

## 2018-12-28 ENCOUNTER — Other Ambulatory Visit: Payer: Self-pay

## 2018-12-28 ENCOUNTER — Other Ambulatory Visit: Payer: Self-pay | Admitting: Neurology

## 2018-12-28 DIAGNOSIS — F321 Major depressive disorder, single episode, moderate: Secondary | ICD-10-CM

## 2018-12-28 DIAGNOSIS — I6789 Other cerebrovascular disease: Secondary | ICD-10-CM | POA: Diagnosis not present

## 2018-12-28 DIAGNOSIS — R413 Other amnesia: Secondary | ICD-10-CM | POA: Diagnosis not present

## 2018-12-28 DIAGNOSIS — M4802 Spinal stenosis, cervical region: Secondary | ICD-10-CM | POA: Diagnosis not present

## 2018-12-28 MED ORDER — MIRTAZAPINE 7.5 MG PO TABS: 7.5000 mg | ORAL_TABLET | Freq: Every day | ORAL | 3 refills | Status: DC

## 2018-12-29 ENCOUNTER — Ambulatory Visit: Payer: Medicare Other | Admitting: Gastroenterology

## 2018-12-29 ENCOUNTER — Telehealth: Payer: Self-pay

## 2018-12-29 VITALS — BP 106/63 | HR 96 | Ht 70.0 in | Wt 128.2 lb

## 2018-12-29 DIAGNOSIS — R1310 Dysphagia, unspecified: Secondary | ICD-10-CM

## 2018-12-29 NOTE — Progress Notes (Signed)
[name], MD [address]  [place], [place] [address]  Main: [phone]  Fax: [phone]   Primary Care Physician: Lavera Guise, MD   Chief complaint: Follow-up of dysphagia  HPI: Ian Conley is a 68 y.o. male with history of CVA and dysphagia here for follow-up.  Last seen on October 2019 at that time had reported complete resolution of dysphagia and did not have any complaints and was eating well.  Continues to report good appetite, no problems swallowing whatsoever.  No dysphagia or odynophagia.  No weight loss. The patient denies abdominal or flank pain, anorexia, nausea or vomiting, dysphagia, change in bowel habits or black or bloody stools or weight loss.  Patient follows up with [facility] neurology, and is on Plavix  As per previous notes: Patient has undergone extensive work-up with upper GI study, and CT scan, none of which reported any concerning mass, defects or malignancy.  However, upper GI study had reported mild smooth narrowing of the distal esophagus suggesting a stricture.  However, patient did not have any dysphagia on last visit, therefore no clinical symptoms of any stricture.  Previously, when EGD was being considered on his initial visits with [place] when he did have the dysphagia, the issue was that patient has had a recent stroke and holding Plavix would put patient at risks given his recent stroke in July 2019.    No previous colonoscopy.  No family history of colon cancer.  Has family history of colon polyps in mother, brother.  However they do not know if any of these polyps were large or over 1 centimeter in size.  Current Outpatient Medications  Medication Sig Dispense Refill  . atorvastatin (LIPITOR) 80 MG tablet Take 1 tablet (80 mg total) by mouth daily at 6 PM. 90 tablet 1  . dipyridamole-aspirin (AGGRENOX) 200-25 MG 12hr capsule Take 1 capsule by mouth 2 (two) times daily. 180 capsule 1  . LORazepam (ATIVAN) 0.5 MG tablet  Take 1/2 to 1 tablet po BID prn agitation. 60 tablet 1  . mirtazapine (REMERON) 7.5 MG tablet Take 1 tablet (7.5 mg total) by mouth at bedtime. 30 tablet 3  . Multiple Vitamins-Minerals (YOUR LIFE MULTI MENS 50+) TABS Take by mouth.    . pantoprazole (PROTONIX) 20 MG tablet Take 1 tablet (20 mg total) by mouth 2 (two) times daily before a meal. (Patient not taking: Reported on 12/08/2018) 60 tablet 0  . polyethylene glycol powder (GLYCOLAX/MIRALAX) powder MIX ONE PACKET IN 8 OUNCES OF LIQUID AND DRINK D FOR MILD CONSTIPATION.  0   No current facility-administered medications for this visit.     Allergies as of 12/29/2018 - Review Complete 12/08/2018  Allergen Reaction Noted  . Penicillins Rash 03/19/2016    ROS:  General: Negative for anorexia, weight loss, fever, chills, fatigue, weakness. ENT: Negative for hoarseness, difficulty swallowing , nasal congestion. CV: Negative for chest pain, angina, palpitations, dyspnea on exertion, peripheral edema.  Respiratory: Negative for dyspnea at rest, dyspnea on exertion, cough, sputum, wheezing.  GI: See history of present illness. GU:  Negative for dysuria, hematuria, urinary incontinence, urinary frequency, nocturnal urination.  Endo: Negative for unusual weight change.    Physical Examination:  General: Well-nourished, well-developed in no acute distress.  Eyes: No icterus. Conjunctivae pink. Mouth: Oropharyngeal mucosa moist and pink , no lesions erythema or exudate. Neck: Supple, Trachea midline Abdomen: Bowel sounds are normal, nontender, nondistended, no hepatosplenomegaly or masses, no abdominal bruits or hernia ,  no rebound or guarding.   Extremities: No lower extremity edema. No clubbing or deformities. Neuro: Alert and oriented x 3.  Grossly intact. Skin: Warm and dry, no jaundice.   Psych: Alert and cooperative, normal mood and affect.   Labs: CMP     Component Value Date/Time   NA 144 12/08/2018 1425   K 3.5 12/08/2018  1425   CL 109 12/08/2018 1425   CO2 27 12/08/2018 1425   GLUCOSE 136 (H) 12/08/2018 1425   BUN 5 (L) 12/08/2018 1425   CREATININE 1.01 12/08/2018 1425   CALCIUM 9.3 12/08/2018 1425   PROT 6.9 12/08/2018 1425   ALBUMIN 3.6 12/08/2018 1425   AST 26 12/08/2018 1425   ALT 19 12/08/2018 1425   ALKPHOS 88 12/08/2018 1425   BILITOT 0.5 12/08/2018 1425   GFRNONAA >60 12/08/2018 1425   GFRAA >60 12/08/2018 1425   Lab Results  Component Value Date   WBC 4.5 12/08/2018   HGB 13.3 12/08/2018   HCT 41.7 12/08/2018   MCV 89.1 12/08/2018   PLT 163 12/08/2018    Imaging Studies: No results found.  Assessment and Plan:   Ian Conley is a 68 y.o. y/o male with previous history of dysphagia here for follow-up  No further dysphagia Good appetite No weight loss  We discussed screening colonoscopy as patient has never had one before Patient and family are agreeable with proceeding However, we will need to obtain clearance from his new neurologist to see if we can safely proceed with a colonoscopy and safely hold his Plavix.  If not, we will contact his primary care provider and discuss if he can obtain regular colorectal cancer screening test other than colonoscopy such as stool DNA testing.  However, we did discuss that any other alternative screening methods will then need a diagnostic colonoscopy if they are positive, and therefore patient and family would rather have a colonoscopy done if the neurologist agrees.  I also discussed with the patient and family that if neurologist is okay with [place] proceeding with a colonoscopy, and EGD would also allow [place] to correlate his upper GI findings endoscopically to rule out any mild strictures.  The reassuring thing is that he is not having any dysphagia or clinical symptoms of a stricture.  But endoscopic evaluation would help [place] rule out any lesions.  Risks and benefits of this procedure discussed in detail along with alternatives of not undergoing  any endoscopy and continuing conservative management given his dysphagia is resolved.  He would like to proceed with EGD along with a colonoscopy as well.  We will first obtain neurology clearance prior to scheduling any procedures.   Dr [name]

## 2018-12-29 NOTE — Telephone Encounter (Signed)
Faxed patient medical clearance form to dental office([facility]), put copy in scan. Beth

## 2018-12-30 ENCOUNTER — Encounter: Payer: Self-pay | Admitting: Gastroenterology

## 2019-01-05 ENCOUNTER — Other Ambulatory Visit: Payer: Self-pay

## 2019-01-05 DIAGNOSIS — Z1211 Encounter for screening for malignant neoplasm of colon: Secondary | ICD-10-CM

## 2019-01-05 DIAGNOSIS — R1310 Dysphagia, unspecified: Secondary | ICD-10-CM

## 2019-01-05 MED ORDER — NA SULFATE-K SULFATE-MG SULF 17.5-3.13-1.6 GM/177ML PO SOLN: 1.0000 | Freq: Once | ORAL | 0 refills | Status: AC

## 2019-01-17 ENCOUNTER — Telehealth: Payer: Self-pay | Admitting: Gastroenterology

## 2019-01-17 ENCOUNTER — Ambulatory Visit
Admission: RE | Admit: 2019-01-17 | Discharge: 2019-01-17 | Disposition: A | Discharge status: Home / Self Care | Payer: Medicare Other | Source: Ambulatory Visit | Attending: Neurology | Admitting: Neurology

## 2019-01-17 DIAGNOSIS — M4802 Spinal stenosis, cervical region: Secondary | ICD-10-CM

## 2019-01-17 DIAGNOSIS — M47812 Spondylosis without myelopathy or radiculopathy, cervical region: Secondary | ICD-10-CM | POA: Diagnosis not present

## 2019-01-17 NOTE — Telephone Encounter (Signed)
I spoke with [name], pt's sister and she will have to cancel pt's procedure. He will not do without his hamburger nor will he drink the prep. She had this issue with another test that was ordered. Pt has Dementia and Alzheimer's and will not cooperate.Trish at [facility] notified. Referral updated.

## 2019-01-17 NOTE — Telephone Encounter (Signed)
Pt sister left vm to speak with Jackelyn Poling she states her brother the pt has Dementia/Althimers and she can not get him to drink the solution for pt procedure 01/26/19 she has tried explaining to him why he is having it done but needs to speak with someone

## 2019-01-23 ENCOUNTER — Encounter: Payer: Self-pay | Admitting: Nurse Practitioner

## 2019-01-23 ENCOUNTER — Ambulatory Visit: Payer: Medicare Other | Admitting: Nurse Practitioner

## 2019-01-23 VITALS — BP 120/58 | HR 83 | Resp 16 | Ht 71.0 in | Wt 129.2 lb

## 2019-01-23 DIAGNOSIS — F0151 Vascular dementia with behavioral disturbance: Secondary | ICD-10-CM | POA: Diagnosis not present

## 2019-01-23 DIAGNOSIS — F01518 Vascular dementia, unspecified severity, with other behavioral disturbance: Secondary | ICD-10-CM | POA: Insufficient documentation

## 2019-01-23 DIAGNOSIS — I10 Essential (primary) hypertension: Secondary | ICD-10-CM

## 2019-01-23 DIAGNOSIS — F321 Major depressive disorder, single episode, moderate: Secondary | ICD-10-CM | POA: Diagnosis not present

## 2019-01-23 DIAGNOSIS — I679 Cerebrovascular disease, unspecified: Secondary | ICD-10-CM | POA: Diagnosis not present

## 2019-01-23 MED ORDER — LORAZEPAM 0.5 MG PO TABS: ORAL_TABLET | ORAL | 1 refills | Status: DC

## 2019-01-23 MED ORDER — MIRTAZAPINE 7.5 MG PO TABS: 7.5000 mg | ORAL_TABLET | Freq: Every day | ORAL | 3 refills | Status: DC

## 2019-01-23 NOTE — Progress Notes (Signed)
[facility] [place], [place] [address]  Internal MEDICINE  Office Visit Note  Patient Name: Ian Conley  [id number]  [id number]  Date of Service: 01/23/2019  Chief Complaint  Patient presents with  . Medical Management of Chronic Issues    3 month follow up,   . Hypertension    He has made significant improvement in strength on the left side since I last saw him in 07/2018. He is now walking with a cane and without assistance. Cognitive ability appears to have improved as well. Interacting appropriately and answering questions and conversing appropriately. Blood pressure is well controlled. There is some residual left sided weakness and a little speech impairment. Physical and occupational therapy has been completed. He does continue to do some of the exercises he was taught, at home on his own. He is in good spirits. He has no concerns or complaints to discuss. He has seen neurologist since his last visit. Was started on aricept and is tolerating this prescription well. Did have repeat MRI of brain and neck for further evaluation of carotid artery stenosis. He sees his follow up with neurology next month.        Current Medication: Outpatient Encounter Medications as of 01/23/2019  Medication Sig  . atorvastatin (LIPITOR) 80 MG tablet Take 1 tablet (80 mg total) by mouth daily at 6 PM.  . dipyridamole-aspirin (AGGRENOX) 200-25 MG 12hr capsule Take 1 capsule by mouth 2 (two) times daily.  . Donepezil HCl (ARICEPT PO) Take by mouth daily.  . Loratadine (CLARITIN PO) Take by mouth.  Marland Kitchen LORazepam (ATIVAN) 0.5 MG tablet Take 1/2 to 1 tablet po BID prn agitation.  . mirtazapine (REMERON) 7.5 MG tablet Take 1 tablet (7.5 mg total) by mouth at bedtime.  . Multiple Vitamins-Minerals (YOUR LIFE MULTI MENS 50+) TABS Take by mouth.  . pantoprazole (PROTONIX) 20 MG tablet Take 1 tablet (20 mg total) by mouth 2 (two) times daily before a meal.  . polyethylene glycol  powder (GLYCOLAX/MIRALAX) powder MIX ONE PACKET IN 8 OUNCES OF LIQUID AND DRINK D FOR MILD CONSTIPATION.  . [DISCONTINUED] LORazepam (ATIVAN) 0.5 MG tablet Take 1/2 to 1 tablet po BID prn agitation.  . [DISCONTINUED] mirtazapine (REMERON) 7.5 MG tablet Take 1 tablet (7.5 mg total) by mouth at bedtime.   No facility-administered encounter medications on file as of 01/23/2019.     Surgical History: History reviewed. No pertinent surgical history.  Medical History: Past Medical History:  Diagnosis Date  . Allergy   . Anemia   . Hypertension   . Pancreatitis   . Personal history of tobacco use, presenting hazards to health 03/25/2016  . Polycythemia   . Stroke [facility])    2008 9/11    Family History: Family History  Problem Relation Age of Onset  . Diabetes Mother   . Heart failure Mother   . Cancer Father        Throat   . Stroke Father   . Hypertension Father   . HIV Brother   . Thyroid disease Sister        thyroid cancer  . Hypertension Sister        both sisters    Social History   Socioeconomic History  . Marital status: Widowed    Spouse name: Not on file  . Number of children: Not on file  . Years of education: Not on file  . Highest education level: Not on file  Occupational History  .  Not on file  Social Needs  . Financial resource strain: Not on file  . Food insecurity:    Worry: Not on file    Inability: Not on file  . Transportation needs:    Medical: Not on file    Non-medical: Not on file  Tobacco Use  . Smoking status: Current Every Day Smoker    Packs/day: 1.00    Years: 45.00    Pack years: 45.00    Types: Cigarettes  . Smokeless tobacco: Never Used  Substance and Sexual Activity  . Alcohol use: Not Currently    Comment: when pt was at home, no alcohol since July, 2019  . Drug use: No  . Sexual activity: Not on file  Lifestyle  . Physical activity:    Days per week: Not on file    Minutes per session: Not on file  . Stress: Not on file   Relationships  . Social connections:    Talks on phone: Not on file    Gets together: Not on file    Attends religious service: Not on file    Active member of club or organization: Not on file    Attends meetings of clubs or organizations: Not on file    Relationship status: Not on file  . Intimate partner violence:    Fear of current or ex partner: Not on file    Emotionally abused: Not on file    Physically abused: Not on file    Forced sexual activity: Not on file  Other Topics Concern  . Not on file  Social History Narrative  . Not on file      Review of Systems  Constitutional: Negative for activity change, appetite change, chills, fatigue and unexpected weight change.  HENT: Negative for congestion, rhinorrhea, sinus pressure, sinus pain, sneezing and sore throat.   Respiratory: Negative for cough, chest tightness, shortness of breath and wheezing.   Cardiovascular: Negative for chest pain and palpitations.  Gastrointestinal: Negative for abdominal pain, constipation, diarrhea, nausea and vomiting.  Endocrine: Negative for cold intolerance, heat intolerance, polydipsia, polyphagia and polyuria.  Genitourinary: Negative for dysuria, flank pain and frequency.       Treatment for UTI part of hospital stay for stroke   Musculoskeletal: Negative for arthralgias, back pain, gait problem, joint swelling and neck pain.  Skin: Negative for rash.  Allergic/Immunologic: Negative for environmental allergies.  Neurological: Positive for facial asymmetry and weakness. Negative for tremors, speech difficulty and numbness.       The left sided weakness is persistent but improved since his most recent visit. Walking with a cane now and without assistance.   Hematological: Negative for adenopathy. Does not bruise/bleed easily.  Psychiatric/Behavioral: Positive for dysphoric mood. Negative for behavioral problems, sleep disturbance and suicidal ideas. The patient is nervous/anxious.         Improved with ,ortazapine every evening and lorazepam as needed for agitation.     Today's Vitals   01/23/19 1401  BP: (!) 120/58  Pulse: 83  Resp: 16  SpO2: 97%  Weight: 129 lb 3.2 oz (58.6 kg)  Height: 5\' 11"  (1.803 m)     Physical Exam Vitals signs and nursing note reviewed.  Constitutional:      General: He is not in acute distress.    Appearance: Normal appearance. He is well-developed. He is not diaphoretic.  HENT:     Head: Normocephalic and atraumatic.     Nose: Nose normal.     Mouth/Throat:  Pharynx: No oropharyngeal exudate.  Eyes:     Conjunctiva/sclera: Conjunctivae normal.     Pupils: Pupils are equal, round, and reactive to light.  Neck:     Musculoskeletal: Normal range of motion and neck supple.     Thyroid: No thyromegaly.     Vascular: No carotid bruit or JVD.     Trachea: No tracheal deviation.  Cardiovascular:     Rate and Rhythm: Normal rate and regular rhythm.     Heart sounds: Murmur present. Systolic murmur present with a grade of 2/6. No friction rub. No gallop.   Pulmonary:     Effort: Pulmonary effort is normal. No respiratory distress.     Breath sounds: Normal breath sounds. No wheezing or rales.  Chest:     Chest wall: No tenderness.  Abdominal:     General: Bowel sounds are normal.     Palpations: Abdomen is soft.     Tenderness: There is no abdominal tenderness. There is no guarding.  Musculoskeletal: Normal range of motion.     Comments: Left upper and lower extremity weakness. Has improved since his last visit. Walking with a cane rather than walker. No longer needing assistance.   Lymphadenopathy:     Cervical: No cervical adenopathy.  Skin:    General: Skin is warm and dry.  Neurological:     Mental Status: He is alert and oriented to person, place, and time.     Cranial Nerves: No cranial nerve deficit.     Comments: Left upper and lower extremity weakness. Has improved since his last visit. Walking with a cane rather than  walker. No longer needing assistance. Memory, attitude, and cognitive abilities have all improved since I last saw him.   Psychiatric:        Mood and Affect: Mood is depressed.        Speech: Speech is delayed.        Behavior: Behavior normal.        Thought Content: Thought content normal.        Cognition and Memory: He exhibits impaired recent memory.        Judgment: Judgment normal.    Assessment/Plan:  1. Essential hypertension Stable. Continue bp medications as prescribed   2. Cerebrovascular disease Stable. Continues to improve. Continue regular visits with neurology as scheduled.   3. Dementia, multiinfarct, with behavioral disturbance ([place]) Started on aricept per neurology. May continue lorazepam 0.5mg  up to twice daily if needed for agitation. New prescription sent to his pharmacy today.  - LORazepam (ATIVAN) 0.5 MG tablet; Take 1/2 to 1 tablet po BID prn agitation.  Dispense: 60 tablet; Refill: 1  4. Episode of moderate major depression (HCC) Stable. Continue mirtazapine 7.5mg  every evening. New prescription provided today.  - mirtazapine (REMERON) 7.5 MG tablet; Take 1 tablet (7.5 mg total) by mouth at bedtime.  Dispense: 30 tablet; Refill: 3 General Counseling: Ian Conley verbalizes understanding of the findings of todays visit and agrees with plan of treatment. I have discussed any further diagnostic evaluation that may be needed or ordered today. We also reviewed his medications today. he has been encouraged to call the office with any questions or concerns that should arise related to todays visit.  Cardiac risk factor modification:  1. Control blood pressure. 2. Exercise as prescribed. 3. Follow low sodium, low fat diet. and low fat and low cholestrol diet. 4. Take ASA 81mg  once a day. 5. Restricted calories diet to lose weight.  This patient was  seen by Leretha Pol FNP Collaboration with Dr Lavera Guise as a part of collaborative care agreement  Meds ordered  this encounter  Medications  . LORazepam (ATIVAN) 0.5 MG tablet    Sig: Take 1/2 to 1 tablet po BID prn agitation.    Dispense:  60 tablet    Refill:  1    Order Specific Question:   Supervising Provider    Answer:   Lavera Guise [[id number]]  . mirtazapine (REMERON) 7.5 MG tablet    Sig: Take 1 tablet (7.5 mg total) by mouth at bedtime.    Dispense:  30 tablet    Refill:  3    Order Specific Question:   Supervising Provider    Answer:   Lavera Guise [[id number]]    Time spent: 56 Minutes      Dr Lavera Guise Internal medicine

## 2019-01-26 ENCOUNTER — Ambulatory Visit: Admit: 2019-01-26 | Payer: Medicare Other | Admitting: Gastroenterology

## 2019-01-26 SURGERY — COLONOSCOPY WITH PROPOFOL
Anesthesia: General

## 2019-02-10 ENCOUNTER — Encounter: Payer: Self-pay | Admitting: Nurse Practitioner

## 2019-02-28 ENCOUNTER — Other Ambulatory Visit: Payer: Self-pay

## 2019-02-28 MED ORDER — ATORVASTATIN CALCIUM 80 MG PO TABS: 80.0000 mg | ORAL_TABLET | Freq: Every day | ORAL | 1 refills | Status: DC

## 2019-02-28 MED ORDER — ASPIRIN-DIPYRIDAMOLE ER 25-200 MG PO CP12: 1.0000 | ORAL_CAPSULE | Freq: Two times a day (BID) | ORAL | 1 refills | Status: DC

## 2019-03-12 ENCOUNTER — Encounter: Payer: Self-pay | Admitting: Hematology and Oncology

## 2019-03-20 ENCOUNTER — Other Ambulatory Visit: Payer: Self-pay

## 2019-03-22 ENCOUNTER — Telehealth: Payer: Self-pay

## 2019-03-22 ENCOUNTER — Other Ambulatory Visit: Payer: Self-pay | Admitting: Nurse Practitioner

## 2019-03-22 DIAGNOSIS — I10 Essential (primary) hypertension: Secondary | ICD-10-CM

## 2019-03-22 MED ORDER — AMLODIPINE BESYLATE 10 MG PO TABS: 10.0000 mg | ORAL_TABLET | Freq: Every day | ORAL | 5 refills | Status: DC

## 2019-03-22 NOTE — Telephone Encounter (Signed)
Renewed prescription for amlodipine 10mg  daily and sent to [facility] in [place]

## 2019-03-22 NOTE — Telephone Encounter (Signed)
Pt advised we send med  

## 2019-03-22 NOTE — Progress Notes (Signed)
Renewed prescription for amlodipine 10mg  daily and sent to [facility] in [place].

## 2019-04-06 ENCOUNTER — Telehealth: Payer: Self-pay | Admitting: *Deleted

## 2019-04-06 ENCOUNTER — Other Ambulatory Visit: Payer: Self-pay

## 2019-04-06 NOTE — Telephone Encounter (Signed)
Called to see if patient was agreeable to coming in the day before his MD visit for lab work. His sister [name] said he could come Friday at 1:30pm. I asked if patient had a smart phone she said "no". I told her that if patients blood work deemed he would need treatment ( a phlebotomy) then he would have a regular MD visit at his already scheduled appt time in the clinic on Monday if not then he would be having a telephone visit instead. I told her we would call back with details of appt after his labs are evaluated.

## 2019-04-07 ENCOUNTER — Inpatient Hospital Stay: Payer: Medicare Other | Attending: Hematology and Oncology

## 2019-04-07 DIAGNOSIS — K222 Esophageal obstruction: Secondary | ICD-10-CM | POA: Diagnosis not present

## 2019-04-07 DIAGNOSIS — F17210 Nicotine dependence, cigarettes, uncomplicated: Secondary | ICD-10-CM | POA: Insufficient documentation

## 2019-04-07 DIAGNOSIS — D751 Secondary polycythemia: Secondary | ICD-10-CM | POA: Diagnosis not present

## 2019-04-07 DIAGNOSIS — Z8673 Personal history of transient ischemic attack (TIA), and cerebral infarction without residual deficits: Secondary | ICD-10-CM | POA: Diagnosis not present

## 2019-04-07 LAB — COMPREHENSIVE METABOLIC PANEL
ALT: 23 U/L (ref 0–44)
AST: 23 U/L (ref 15–41)
Albumin: 4.1 g/dL (ref 3.5–5.0)
Alkaline Phosphatase: 102 U/L (ref 38–126)
Anion gap: 5 (ref 5–15)
BUN: 7 mg/dL — ABNORMAL LOW (ref 8–23)
CO2: 27 mmol/L (ref 22–32)
Calcium: 9.3 mg/dL (ref 8.9–10.3)
Chloride: 106 mmol/L (ref 98–111)
Creatinine, Ser: 0.91 mg/dL (ref 0.61–1.24)
GFR calc Af Amer: >60 mL/min (ref >60)
GFR calc non Af Amer: >60 mL/min (ref >60)
Glucose, Bld: 110 mg/dL — ABNORMAL HIGH (ref 70–99)
Potassium: 4.4 mmol/L (ref 3.5–5.1)
Sodium: 138 mmol/L (ref 135–145)
Total Bilirubin: 0.6 mg/dL (ref 0.3–1.2)
Total Protein: 7.2 g/dL (ref 6.5–8.1)

## 2019-04-07 LAB — CBC WITH DIFFERENTIAL/PLATELET
Abs Immature Granulocytes: 0.01 10*3/uL (ref 0.00–0.07)
Basophils Absolute: 0 10*3/uL (ref 0.0–0.1)
Basophils Relative: 0 %
Eosinophils Absolute: 0 10*3/uL (ref 0.0–0.5)
Eosinophils Relative: 1 %
HCT: 41.1 % (ref 39.0–52.0)
Hemoglobin: 13.5 g/dL (ref 13.0–17.0)
Immature Granulocytes: 0 %
Lymphocytes Relative: 28 %
Lymphs Abs: 1.5 10*3/uL (ref 0.7–4.0)
MCH: 30.3 pg (ref 26.0–34.0)
MCHC: 32.8 g/dL (ref 30.0–36.0)
MCV: 92.2 fL (ref 80.0–100.0)
Monocytes Absolute: 0.5 10*3/uL (ref 0.1–1.0)
Monocytes Relative: 10 %
Neutro Abs: 3.1 10*3/uL (ref 1.7–7.7)
Neutrophils Relative %: 61 %
Platelets: 163 10*3/uL (ref 150–400)
RBC: 4.46 MIL/uL (ref 4.22–5.81)
RDW: 14.6 % (ref 11.5–15.5)
WBC: 5.1 10*3/uL (ref 4.0–10.5)
nRBC: 0 % (ref 0.0–0.2)

## 2019-04-10 ENCOUNTER — Encounter: Payer: Self-pay | Admitting: Hematology and Oncology

## 2019-04-10 ENCOUNTER — Inpatient Hospital Stay (HOSPITAL_BASED_OUTPATIENT_CLINIC_OR_DEPARTMENT_OTHER): Payer: Medicare Other | Admitting: Hematology and Oncology

## 2019-04-10 ENCOUNTER — Other Ambulatory Visit: Payer: Medicare Other

## 2019-04-10 DIAGNOSIS — D751 Secondary polycythemia: Secondary | ICD-10-CM | POA: Diagnosis not present

## 2019-04-10 DIAGNOSIS — R634 Abnormal weight loss: Secondary | ICD-10-CM

## 2019-04-10 DIAGNOSIS — E43 Unspecified severe protein-calorie malnutrition: Secondary | ICD-10-CM

## 2019-04-10 NOTE — Progress Notes (Signed)
[facility]  [address], [address] [place], [place] [address] Phone: [phone]  Fax: [phone]   Telephone Office Visit:  04/10/2019  Referring physician: Lavera Guise, MD  I connected with Ian Conley on 04/10/19 at 3:01 PM EDT by phone and verified that I was speaking with the correct person using 2 identifiers.  The patient was at home.  I discussed the limitations, risk, security and privacy concerns of performing an evaluation and management service by telephone and the availability of in person appointments.  I also discussed with the patient that there may be a patient responsible charge related to this service.  The patient expressed understanding and agreed to proceed.   Chief Complaint: Ian Conley is a 68 y.o. 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  HPI:   The patient was last seen in the hematology clinic on 12/08/2018.  At that time, his energy level had improved.  He was gaining weight.  He had completed physical therapy.  Hemoglobin was 13.3.  He did not require a  indication for phlebotomy.  He continued to smoke 1 pack per day.  During the interim, he describes his energy level is "not too bad".  He is walking from the living room to the kitchen.  He states that he is losing some weight.  He weighs 134.8 pounds (last weight in 01/2019 was 129 pounds).  His sister cooks.  His appetite is good.  He notes only having a few teeth left (1 at the top and a few on the bottom).  He is smoking 1/2 - 3/4 pack a day.  He is seeing a neurologist in [place].  He has a neurology appointment this week.   Past Medical History:  Diagnosis Date  . Allergy   . Anemia   . Hypertension   . Pancreatitis   . Personal history of tobacco use, presenting hazards to health 03/25/2016  . Polycythemia   . Stroke [facility])    2008 9/11    History reviewed. No pertinent surgical history.  Family History  Problem Relation Age of  Onset  . Diabetes Mother   . Heart failure Mother   . Cancer Father        Throat   . Stroke Father   . Hypertension Father   . HIV Brother   . Thyroid disease Sister        thyroid cancer  . Hypertension Sister        both sisters    Social History:  reports that he has been smoking cigarettes. He has a 45.00 pack-year smoking history. He has never used smokeless tobacco. He reports previous alcohol use. He reports that he does not use drugs.  He started smoking between the age of [age].  He smoked 1 pack a day for 48 years.  He is smoking 1 pack a day.  He drinks 2- 24 ounce beers/day.  He was in [employer] for 3 years.  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  He lives in [place].  The patient accompanied by his sister, [name], today.  Participants in the patient's visit and their role in the encounter included the patient, Melvia Heaps, and Vito Berger, CMA, today.  The intake visit was provided by Vito Berger, CMA.   Allergies:  Allergies  Allergen Reactions  . Penicillins Rash    Has patient had a PCN reaction causing immediate rash,  facial/tongue/throat swelling, SOB or lightheadedness with hypotension: Yes Has patient had a PCN reaction causing severe rash involving mucus membranes or skin necrosis: Unknown Has patient had a PCN reaction that required hospitalization: No Has patient had a PCN reaction occurring within the last 10 years: No If all of the above answers are "NO", then may proceed with Cephalosporin use.    Current Medications: Current Outpatient Medications  Medication Sig Dispense Refill  . amLODipine (NORVASC) 10 MG tablet Take 1 tablet (10 mg total) by mouth daily. 30 tablet 5  . atorvastatin (LIPITOR) 80 MG tablet Take 1 tablet (80 mg total) by mouth daily at 6 PM. 90 tablet 1  . dipyridamole-aspirin (AGGRENOX) 200-25 MG 12hr capsule Take 1 capsule by mouth 2 (two) times daily. 180 capsule 1  . Donepezil HCl (ARICEPT PO) Take  by mouth daily.    . Loratadine (CLARITIN PO) Take by mouth.    Marland Kitchen LORazepam (ATIVAN) 0.5 MG tablet Take 1/2 to 1 tablet po BID prn agitation. 60 tablet 1  . mirtazapine (REMERON) 7.5 MG tablet Take 1 tablet (7.5 mg total) by mouth at bedtime. 30 tablet 3  . Multiple Vitamins-Minerals (YOUR LIFE MULTI MENS 50+) TABS Take by mouth.    . pantoprazole (PROTONIX) 20 MG tablet Take 1 tablet (20 mg total) by mouth 2 (two) times daily before a meal. 60 tablet 0  . polyethylene glycol powder (GLYCOLAX/MIRALAX) powder MIX ONE PACKET IN 8 OUNCES OF LIQUID AND DRINK D FOR MILD CONSTIPATION.  0   No current facility-administered medications for this visit.     Review of Systems  Constitutional: Negative.  Negative for chills, diaphoresis, fever and malaise/fatigue. Weight loss: 134.8 pounds.       Energy level "not bad".  HENT: Negative.  Negative for congestion, ear discharge, ear pain, nosebleeds, sinus pain and sore throat.        Notes only having a few teeth left.  Eyes: Negative.  Negative for blurred vision, double vision, photophobia, pain, discharge and redness.  Respiratory: Negative.  Negative for hemoptysis, sputum production and shortness of breath.   Cardiovascular: Negative.  Negative for chest pain, palpitations, orthopnea, leg swelling and PND.  Gastrointestinal: Negative.  Negative for abdominal pain, blood in stool, constipation, diarrhea, heartburn, melena, nausea and vomiting.       Appetite is good.  Oral intake affected by dentition.  Genitourinary: Negative.  Negative for dysuria, frequency, hematuria and urgency.  Musculoskeletal: Negative.  Negative for back pain, falls, joint pain, myalgias and neck pain.  Skin: Negative.  Negative for itching and rash.  Neurological: Positive for weakness (generalized). Negative for dizziness, tremors, speech change, focal weakness, seizures and headaches.       S/p CVA.  Follows up with neurology.  Endo/Heme/Allergies: Negative.  Does not  bruise/bleed easily.  Psychiatric/Behavioral: Positive for memory loss ("gone for awhile"). Negative for depression. The patient is not nervous/anxious and does not have insomnia.   All other systems reviewed and are negative.  Performance status (ECOG): 2   No visits with results within 3 Day(s) from this visit.  Latest known visit with results is:  Appointment on 04/07/2019  Component Date Value Ref Range Status  . Sodium 04/07/2019 138  135 - 145 mmol/L Final  . Potassium 04/07/2019 4.4  3.5 - 5.1 mmol/L Final  . Chloride 04/07/2019 106  98 - 111 mmol/L Final  . CO2 04/07/2019 27  22 - 32 mmol/L Final  . Glucose, Bld 04/07/2019 110* 70 -  99 mg/dL Final  . BUN 04/07/2019 7* 8 - 23 mg/dL Final  . Creatinine, Ser 04/07/2019 0.91  0.61 - 1.24 mg/dL Final  . Calcium 04/07/2019 9.3  8.9 - 10.3 mg/dL Final  . Total Protein 04/07/2019 7.2  6.5 - 8.1 g/dL Final  . Albumin 04/07/2019 4.1  3.5 - 5.0 g/dL Final  . AST 04/07/2019 23  15 - 41 U/L Final  . ALT 04/07/2019 23  0 - 44 U/L Final  . Alkaline Phosphatase 04/07/2019 102  38 - 126 U/L Final  . Total Bilirubin 04/07/2019 0.6  0.3 - 1.2 mg/dL Final  . GFR calc non Af Amer 04/07/2019 >60  >60 mL/min Final  . GFR calc Af Amer 04/07/2019 >60  >60 mL/min Final  . Anion gap 04/07/2019 5  5 - 15 Final   Performed at [facility], [address] [place], [place] [address]  . WBC 04/07/2019 5.1  4.0 - 10.5 K/uL Final  . RBC 04/07/2019 4.46  4.22 - 5.81 MIL/uL Final  . Hemoglobin 04/07/2019 13.5  13.0 - 17.0 g/dL Final  . HCT 04/07/2019 41.1  39.0 - 52.0 % Final  . MCV 04/07/2019 92.2  80.0 - 100.0 fL Final  . MCH 04/07/2019 30.3  26.0 - 34.0 pg Final  . MCHC 04/07/2019 32.8  30.0 - 36.0 g/dL Final  . RDW 04/07/2019 14.6  11.5 - 15.5 % Final  . Platelets 04/07/2019 163  150 - 400 K/uL Final  . nRBC 04/07/2019 0.0  0.0 - 0.2 % Final  . Neutrophils Relative % 04/07/2019 61  % Final  . Neutro Abs 04/07/2019 3.1  1.7 - 7.7 K/uL  Final  . Lymphocytes Relative 04/07/2019 28  % Final  . Lymphs Abs 04/07/2019 1.5  0.7 - 4.0 K/uL Final  . Monocytes Relative 04/07/2019 10  % Final  . Monocytes Absolute 04/07/2019 0.5  0.1 - 1.0 K/uL Final  . Eosinophils Relative 04/07/2019 1  % Final  . Eosinophils Absolute 04/07/2019 0.0  0.0 - 0.5 K/uL Final  . Basophils Relative 04/07/2019 0  % Final  . Basophils Absolute 04/07/2019 0.0  0.0 - 0.1 K/uL Final  . Immature Granulocytes 04/07/2019 0  % Final  . Abs Immature Granulocytes 04/07/2019 0.01  0.00 - 0.07 K/uL Final   Performed at [facility], [address] [place],  [address]    Assessment:  BRECK MARYLAND is a 68 y.o. 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  He has a 48 pack year smoking history.  He denies any cardiac disease, sleep apnea, or testosterone use.  He had a CVA on 09/01/2007 and 07/13/2018.  Work-up on 03/19/2016 revealed a hematocrit of 57.3, hemoglobin 19.5, MCV 91.5, platelets 144,000, and WBC 5000.  Erythropoietin level was 3.0 (2.6-18.5).  Repeat epo level was 6.8 on 05/05/2016.  JAK2 was negative for V617F and exon 12.  Ferritin was 181.  Iron studies revealed a saturation of 41% and a TIBC of 333.  Low dose spiral chest CT scan on 03/25/2016 was negative.  There was notation of a thickened area in the distal stomach or pyloric region.  Chest and abdomen CT on 08/09/2018 revealed moderate to advanced centrilobular and paraseptal emphysema.  There was biapical and bibasilar scarring.  There was scattered aortic atherosclerosis. No aneurysm. There was no acute cardiopulmonary disease.  There was mild fatty infiltration of the liver.  There was no gastric abnormality by CT.  There was no  acute findings in the abdomen.  He has an elevated alkaline phosphatase (139).  PSA was 0.4.  He has never had a colonoscopy.  He undergoes small volume phlebotomy (250-300 cc) if his hematocrit is > 52.  Last phlebotomy was  08/06/2016.  Symptomatically, his energy level has improved.  He has a good appetite.  He notes weight loss, but his current weight (different scale) indicated weight gain.  Plan: 1. Review labs from 04/07/2019. 2. Secondary polycythemia  Hematocrit 41.1.  Hemoglobin 13.5.  He is smoking 1/2-3/4 pack per day.  Continue to encourage smoking cessation.  No phlebotomy needed. 3.   Weight loss   Appetite is good.     Patient appears to be gaining weight based on his declared weight.   Continue to monitor. 4.   RTC in 4 months for MD assessment and labs (CBC with diff, CMP, ferritin).  I discussed the assessment and treatment plan with the patient.  The patient was provided an opportunity to ask questions and all were answered.  The patient agreed with the plan and demonstrated an understanding of the instructions.  The patient was advised to call back or seek an in person evaluation if the symptoms worsen or if the condition fails to improve as anticipated.  I provided 18 minutes (3:01 PM - 3:19 PM) of non-face-to-face time during this encounter.  I provided these services from the [facility] office.   Lequita Asal, MD  04/10/2019, 3:01 PM

## 2019-04-12 DIAGNOSIS — I6782 Cerebral ischemia: Secondary | ICD-10-CM | POA: Diagnosis not present

## 2019-04-12 DIAGNOSIS — R413 Other amnesia: Secondary | ICD-10-CM | POA: Diagnosis not present

## 2019-04-12 DIAGNOSIS — M4802 Spinal stenosis, cervical region: Secondary | ICD-10-CM | POA: Diagnosis not present

## 2019-05-25 ENCOUNTER — Ambulatory Visit: Payer: Self-pay | Admitting: Nurse Practitioner

## 2019-05-30 ENCOUNTER — Other Ambulatory Visit: Payer: Self-pay

## 2019-05-30 DIAGNOSIS — F01518 Vascular dementia, unspecified severity, with other behavioral disturbance: Secondary | ICD-10-CM

## 2019-05-30 DIAGNOSIS — F0151 Vascular dementia with behavioral disturbance: Secondary | ICD-10-CM

## 2019-05-30 MED ORDER — LORAZEPAM 0.5 MG PO TABS: ORAL_TABLET | ORAL | 0 refills | Status: DC

## 2019-06-26 ENCOUNTER — Ambulatory Visit: Payer: Medicare Other | Admitting: Nurse Practitioner

## 2019-06-26 ENCOUNTER — Other Ambulatory Visit: Payer: Self-pay

## 2019-06-26 ENCOUNTER — Encounter: Payer: Self-pay | Admitting: Nurse Practitioner

## 2019-06-26 VITALS — BP 124/67 | HR 80 | Temp 99.3°F | Resp 16 | Ht 71.0 in | Wt 138.4 lb

## 2019-06-26 DIAGNOSIS — I10 Essential (primary) hypertension: Secondary | ICD-10-CM

## 2019-06-26 DIAGNOSIS — R202 Paresthesia of skin: Secondary | ICD-10-CM

## 2019-06-26 DIAGNOSIS — I639 Cerebral infarction, unspecified: Secondary | ICD-10-CM

## 2019-06-26 DIAGNOSIS — F0151 Vascular dementia with behavioral disturbance: Secondary | ICD-10-CM

## 2019-06-26 DIAGNOSIS — F01518 Vascular dementia, unspecified severity, with other behavioral disturbance: Secondary | ICD-10-CM

## 2019-06-26 DIAGNOSIS — R200 Anesthesia of skin: Secondary | ICD-10-CM | POA: Diagnosis not present

## 2019-06-26 MED ORDER — LORAZEPAM 0.5 MG PO TABS: ORAL_TABLET | ORAL | 2 refills | Status: DC

## 2019-06-26 NOTE — Progress Notes (Signed)
[facility] [place], [place] [address]  Internal MEDICINE  Office Visit Note  Patient Name: Ian Conley  [id number]  [id number]  Date of Service: 06/26/2019  Chief Complaint  Patient presents with  . Medical Management of Chronic Issues    4 month follow up, pt is seeing neuroliogist in [place] now,   . Hypertension    The patient is here for routine follow up visit. He had sudden onset of numbness and tingling in left arm while waiting to be seen. He denies  Chest pain, chest pressure, and shortness of breath. He denies headache and dizziness. States that this will happen once in a while and has to work it out and feeling will generally come back to normal. He had CVA last year which resulted in left upper extremity weakness. He states that he is feeling well with no other concerns or complaints today.       Current Medication: Outpatient Encounter Medications as of 06/26/2019  Medication Sig  . amLODipine (NORVASC) 10 MG tablet Take 1 tablet (10 mg total) by mouth daily.  Marland Kitchen atorvastatin (LIPITOR) 80 MG tablet Take 1 tablet (80 mg total) by mouth daily at 6 PM.  . dipyridamole-aspirin (AGGRENOX) 200-25 MG 12hr capsule Take 1 capsule by mouth 2 (two) times daily.  . Donepezil HCl (ARICEPT PO) Take by mouth daily.  . Loratadine (CLARITIN PO) Take by mouth.  Marland Kitchen LORazepam (ATIVAN) 0.5 MG tablet Take 1/2 to 1 tablet po BID prn agitation.  . mirtazapine (REMERON) 7.5 MG tablet Take 1 tablet (7.5 mg total) by mouth at bedtime.  . Multiple Vitamins-Minerals (YOUR LIFE MULTI MENS 50+) TABS Take by mouth.  . pantoprazole (PROTONIX) 20 MG tablet Take 1 tablet (20 mg total) by mouth 2 (two) times daily before a meal.  . polyethylene glycol powder (GLYCOLAX/MIRALAX) powder MIX ONE PACKET IN 8 OUNCES OF LIQUID AND DRINK D FOR MILD CONSTIPATION.  . [DISCONTINUED] LORazepam (ATIVAN) 0.5 MG tablet Take 1/2 to 1 tablet po BID prn agitation.   No facility-administered  encounter medications on file as of 06/26/2019.     Surgical History: History reviewed. No pertinent surgical history.  Medical History: Past Medical History:  Diagnosis Date  . Allergy   . Anemia   . Hypertension   . Pancreatitis   . Personal history of tobacco use, presenting hazards to health 03/25/2016  . Polycythemia   . Stroke [facility])    2008 9/11    Family History: Family History  Problem Relation Age of Onset  . Diabetes Mother   . Heart failure Mother   . Cancer Father        Throat   . Stroke Father   . Hypertension Father   . HIV Brother   . Thyroid disease Sister        thyroid cancer  . Hypertension Sister        both sisters    Social History   Socioeconomic History  . Marital status: Widowed    Spouse name: Not on file  . Number of children: Not on file  . Years of education: Not on file  . Highest education level: Not on file  Occupational History  . Not on file  Social Needs  . Financial resource strain: Not on file  . Food insecurity    Worry: Not on file    Inability: Not on file  . Transportation needs    Medical: Not on file  Non-medical: Not on file  Tobacco Use  . Smoking status: Current Every Day Smoker    Packs/day: 1.00    Years: 45.00    Pack years: 45.00    Types: Cigarettes  . Smokeless tobacco: Never Used  Substance and Sexual Activity  . Alcohol use: Not Currently    Comment: when pt was at home, no alcohol since July, 2019  . Drug use: No  . Sexual activity: Not on file  Lifestyle  . Physical activity    Days per week: Not on file    Minutes per session: Not on file  . Stress: Not on file  Relationships  . Social Herbalist on phone: Not on file    Gets together: Not on file    Attends religious service: Not on file    Active member of club or organization: Not on file    Attends meetings of clubs or organizations: Not on file    Relationship status: Not on file  . Intimate partner violence    Fear of  current or ex partner: Not on file    Emotionally abused: Not on file    Physically abused: Not on file    Forced sexual activity: Not on file  Other Topics Concern  . Not on file  Social History Narrative  . Not on file      Review of Systems  Constitutional: Negative for activity change, appetite change, chills, fatigue and unexpected weight change.  HENT: Negative for congestion, rhinorrhea, sinus pressure, sinus pain, sneezing and sore throat.   Respiratory: Negative for cough, chest tightness, shortness of breath and wheezing.   Cardiovascular: Negative for chest pain and palpitations.  Gastrointestinal: Negative for abdominal pain, constipation, diarrhea, nausea and vomiting.  Endocrine: Negative for cold intolerance, heat intolerance, polydipsia and polyuria.  Musculoskeletal: Positive for myalgias. Negative for arthralgias, back pain, gait problem, joint swelling and neck pain.       Left arm and hand weakness.   Skin: Negative for rash.  Allergic/Immunologic: Negative for environmental allergies.  Neurological: Positive for facial asymmetry and weakness. Negative for tremors, speech difficulty and numbness.       The left sided weakness is persistent. Had sudden onset of left arm weakness and numbness while waiting to be seen. Shaking out the hand and arm generally makes this feeling better.   Hematological: Negative for adenopathy. Does not bruise/bleed easily.  Psychiatric/Behavioral: Positive for dysphoric mood. Negative for behavioral problems, sleep disturbance and suicidal ideas. The patient is nervous/anxious.        Improved with mirtazapine every evening and lorazepam as needed for agitation.     Today's Vitals   06/26/19 1355  BP: 124/67  Pulse: 80  Resp: 16  Temp: 99.3 F (37.4 C)  SpO2: 97%  Weight: 138 lb 6.4 oz (62.8 kg)  Height: 5\' 11"  (1.803 m)   Body mass index is 19.3 kg/m.  Physical Exam Vitals signs and nursing note reviewed.  Constitutional:       General: He is not in acute distress.    Appearance: Normal appearance. He is well-developed. He is not diaphoretic.  HENT:     Head: Normocephalic and atraumatic.     Nose: Nose normal.     Mouth/Throat:     Pharynx: No oropharyngeal exudate.  Eyes:     Conjunctiva/sclera: Conjunctivae normal.     Pupils: Pupils are equal, round, and reactive to light.  Neck:     Musculoskeletal: Normal  range of motion and neck supple.     Thyroid: No thyromegaly.     Vascular: No carotid bruit or JVD.     Trachea: No tracheal deviation.  Cardiovascular:     Rate and Rhythm: Normal rate and regular rhythm.     Heart sounds: Murmur present. Systolic murmur present with a grade of 2/6. No friction rub. No gallop.      Comments: ECG done in the office showing non-specific t-wave abnormality. No acute problems or abnormalities noted today.  Pulmonary:     Effort: Pulmonary effort is normal. No respiratory distress.     Breath sounds: Normal breath sounds. No wheezing or rales.  Chest:     Chest wall: No tenderness.  Abdominal:     General: Bowel sounds are normal.     Palpations: Abdomen is soft.     Tenderness: There is no abdominal tenderness. There is no guarding.  Musculoskeletal: Normal range of motion.     Comments: Left upper and lower extremity weakness. Has improved since his last visit. Walking with a cane rather than walker. No longer needing assistance.  Grips just slightly weaker on left side than right.   Lymphadenopathy:     Cervical: No cervical adenopathy.  Skin:    General: Skin is warm and dry.  Neurological:     Mental Status: He is alert and oriented to person, place, and time.     Cranial Nerves: No cranial nerve deficit.     Comments: Left upper and lower extremity weakness. Has improved since his last visit. Walking with a cane rather than walker. No longer needing assistance. Memory, attitude, and cognitive abilities have all improved since I last saw him.    Psychiatric:        Mood and Affect: Mood is depressed.        Speech: Speech is delayed.        Behavior: Behavior normal.        Thought Content: Thought content normal.        Cognition and Memory: He exhibits impaired recent memory.        Judgment: Judgment normal.   Assessment/Plan: 1. Numbness and tingling in left arm - EKG 12-Lead showing non-specific t-wave abnormality with no findings. Strength in left grip just slightly diminished from right. Will continue to monitor.   2. Dementia, multiinfarct, with behavioral disturbance ([place]) May take lorazepam 0.5mg  twice daily as needed for acute agitation and anxiety. New prescription sent to his pharmacy today.  - LORazepam (ATIVAN) 0.5 MG tablet; Take 1/2 to 1 tablet po BID prn agitation.  Dispense: 60 tablet; Refill: 2  3. Essential hypertension Stable. Continue bp medication as prescribed   4. Cerebrovascular accident (CVA), unspecified mechanism ([place]) Continue atorvastatin and aggrenox as prescribed. Regular visits with neurology as scheduled.   General Counseling: Kaelum verbalizes understanding of the findings of todays visit and agrees with plan of treatment. I have discussed any further diagnostic evaluation that may be needed or ordered today. We also reviewed his medications today. he has been encouraged to call the office with any questions or concerns that should arise related to todays visit.  Cardiac risk factor modification:  1. Control blood pressure. 2. Exercise as prescribed. 3. Follow low sodium, low fat diet. and low fat and low cholestrol diet. 4. Take ASA 81mg  once a day. 5. Restricted calories diet to lose weight.  This patient was seen by Leretha Pol FNP Collaboration with Dr Lavera Guise as  a part of collaborative care agreement  Orders Placed This Encounter  Procedures  . EKG 12-Lead    Meds ordered this encounter  Medications  . LORazepam (ATIVAN) 0.5 MG tablet    Sig: Take 1/2 to 1 tablet po  BID prn agitation.    Dispense:  60 tablet    Refill:  2    Order Specific Question:   Supervising Provider    Answer:   Lavera Guise [[id number]]    Time spent: 1 Minutes      Dr Lavera Guise Internal medicine

## 2019-06-28 ENCOUNTER — Telehealth: Payer: Self-pay

## 2019-06-28 NOTE — Telephone Encounter (Signed)
Pt sister called that mr Graddy still had numbness and tingling on his arm he was seen in the office Monday as per adam go to ED and also called cardiology

## 2019-07-26 ENCOUNTER — Other Ambulatory Visit: Payer: Self-pay

## 2019-07-26 ENCOUNTER — Ambulatory Visit: Payer: Medicare Other | Admitting: Gastroenterology

## 2019-07-26 ENCOUNTER — Encounter: Payer: Self-pay | Admitting: Gastroenterology

## 2019-07-26 ENCOUNTER — Ambulatory Visit (INDEPENDENT_AMBULATORY_CARE_PROVIDER_SITE_OTHER): Payer: Medicare Other | Admitting: Gastroenterology

## 2019-07-26 ENCOUNTER — Encounter (INDEPENDENT_AMBULATORY_CARE_PROVIDER_SITE_OTHER): Payer: Self-pay

## 2019-07-26 VITALS — BP 101/58 | HR 69 | Temp 98.5°F | Resp 16 | Ht 71.0 in | Wt 139.2 lb

## 2019-07-26 DIAGNOSIS — I10 Essential (primary) hypertension: Secondary | ICD-10-CM

## 2019-07-26 DIAGNOSIS — R1310 Dysphagia, unspecified: Secondary | ICD-10-CM | POA: Diagnosis not present

## 2019-07-26 MED ORDER — AMLODIPINE BESYLATE 10 MG PO TABS: 10.0000 mg | ORAL_TABLET | Freq: Every day | ORAL | 1 refills | Status: DC

## 2019-07-27 NOTE — Progress Notes (Signed)
[name], MD [address]  [place]  [place], [place] [address]  Main: [phone]  Fax: [phone]   Primary Care Physician: Lavera Guise, MD   Chief Complaint  Patient presents with  . Follow-up    6 mo    HPI: Ian Conley is a 68 y.o. male here for follow-up.  No further dysphagia or odynophagia.  Tolerating oral diet well.  Documented weight are stable, with no weight loss.  The patient denies abdominal or flank pain, anorexia, nausea or vomiting, dysphagia, change in bowel habits or black or bloody stools or weight loss.  As per previous notes: Patient has undergone extensive work-up with upper GI study, and CT scan, none of which reported any concerning mass, defects or malignancy.  However, upper GI study had reported mild smooth narrowing of the distal esophagus suggesting a stricture.  However, patient did not have any dysphagia on last visits, therefore no clinical symptoms of any stricture.  Previously, when EGD was being considered on his initial visits with [place] when he did have the dysphagia, the issue was that patient has had a recent stroke and holding Plavix would put patient at risks given his recent stroke in July 2019.  Then, his dysphagia completely resolved and after discussing risks and benefits of procedure, both family and patient decided against upper endoscopy.  No previous colonoscopy.  No family history of colon cancer.  Has family history of colon polyps in mother, brother.  However they do not know if any of these polyps were large or over 1 centimeter in size.  Current Outpatient Medications  Medication Sig Dispense Refill  . amLODipine (NORVASC) 10 MG tablet Take 1 tablet (10 mg total) by mouth daily. 90 tablet 1  . atorvastatin (LIPITOR) 80 MG tablet Take 1 tablet (80 mg total) by mouth daily at 6 PM. 90 tablet 1  . dipyridamole-aspirin (AGGRENOX) 200-25 MG 12hr capsule Take 1 capsule by mouth 2 (two) times daily. 180  capsule 1  . Donepezil HCl (ARICEPT PO) Take by mouth daily.    . Loratadine (CLARITIN PO) Take by mouth.    Marland Kitchen LORazepam (ATIVAN) 0.5 MG tablet Take 1/2 to 1 tablet po BID prn agitation. 60 tablet 2  . mirtazapine (REMERON) 7.5 MG tablet Take 1 tablet (7.5 mg total) by mouth at bedtime. 30 tablet 3  . Multiple Vitamins-Minerals (YOUR LIFE MULTI MENS 50+) TABS Take by mouth.    . polyethylene glycol powder (GLYCOLAX/MIRALAX) powder MIX ONE PACKET IN 8 OUNCES OF LIQUID AND DRINK D FOR MILD CONSTIPATION.  0  . pantoprazole (PROTONIX) 20 MG tablet Take 1 tablet (20 mg total) by mouth 2 (two) times daily before a meal. (Patient not taking: Reported on 07/26/2019) 60 tablet 0   No current facility-administered medications for this visit.     Allergies as of 07/26/2019 - Review Complete 07/26/2019  Allergen Reaction Noted  . Penicillins Rash 03/19/2016    ROS:  General: Negative for anorexia, weight loss, fever, chills, fatigue, weakness. ENT: Negative for hoarseness, difficulty swallowing , nasal congestion. CV: Negative for chest pain, angina, palpitations, dyspnea on exertion, peripheral edema.  Respiratory: Negative for dyspnea at rest, dyspnea on exertion, cough, sputum, wheezing.  GI: See history of present illness. GU:  Negative for dysuria, hematuria, urinary incontinence, urinary frequency, nocturnal urination.  Endo: Negative for unusual weight change.    Physical Examination:   BP (!) 101/58 (BP Location: Left Arm, Patient Position: Sitting, Cuff Size:  Normal)   Pulse 69   Temp 98.5 F (36.9 C)   Resp 16   Ht 5\' 11"  (1.803 m)   Wt 139 lb 3.2 oz (63.1 kg)   BMI 19.41 kg/m   General: Well-nourished, well-developed in no acute distress.  Eyes: No icterus. Conjunctivae pink. Mouth: Oropharyngeal mucosa moist and pink , no lesions erythema or exudate. Neck: Supple, Trachea midline Abdomen: Bowel sounds are normal, nontender, nondistended, no hepatosplenomegaly or masses, no  abdominal bruits or hernia , no rebound or guarding.   Extremities: No lower extremity edema. No clubbing or deformities. Neuro: Alert and oriented x 3.  Grossly intact. Skin: Warm and dry, no jaundice.   Psych: Alert and cooperative, normal mood and affect.   Labs: CMP     Component Value Date/Time   NA 138 04/07/2019 1336   K 4.4 04/07/2019 1336   CL 106 04/07/2019 1336   CO2 27 04/07/2019 1336   GLUCOSE 110 (H) 04/07/2019 1336   BUN 7 (L) 04/07/2019 1336   CREATININE 0.91 04/07/2019 1336   CALCIUM 9.3 04/07/2019 1336   PROT 7.2 04/07/2019 1336   ALBUMIN 4.1 04/07/2019 1336   AST 23 04/07/2019 1336   ALT 23 04/07/2019 1336   ALKPHOS 102 04/07/2019 1336   BILITOT 0.6 04/07/2019 1336   GFRNONAA >60 04/07/2019 1336   GFRAA >60 04/07/2019 1336   Lab Results  Component Value Date   WBC 5.1 04/07/2019   HGB 13.5 04/07/2019   HCT 41.1 04/07/2019   MCV 92.2 04/07/2019   PLT 163 04/07/2019    Imaging Studies: No results found.  Assessment and Plan:   Ian Conley is a 68 y.o. y/o male with previous history of dysphagia now completely resolved  Patient reporting good appetite, no dysphagia, no weight loss  Patient and family, both continue to refuse upper endoscopy given patient does not have any symptoms  I also discussed need for screening colonoscopy, which was previously scheduled and patient was not able to take the prep.  We discussed the options of slow prep, but patient and family do not want this done.  We does discussed options of alternative CRC screening methods.  CT colonoscopy would also entail a prep therefore, would not be a good test in this case since patient does not want to take the prep.  Patient family thinks they had stool testing done for CRC screening last year with their primary care provider.  I have thus recommended that they follow-up with your primary care provider to ensure that they are getting CRC screening via stool testing done at  appropriate intervals.  I have asked him to talk to PCP about stool DNA testing.  They verbalized understanding.  I have also messaged their PCP, Leretha Pol via the epic messaging system and this note will be CCed to them.  I did explain to them that if the stool testing is positive, he will need a colonoscopy and they verbalized understanding.  Follow-up with [place] as needed  Follow-up with PCP as scheduled    Dr [name]

## 2019-08-06 IMAGING — MR MR HEAD W/O CM
8 of 10 series · 35 of 48 positions shown · non-contrast
Comparison: Prior CT from 07/13/2018 as well as previous MRI from
09/02/2007.

CLINICAL DATA: Initial evaluation for acute encephalopathy. Altered
mental status.

EXAM:
MRI HEAD WITHOUT CONTRAST
TECHNIQUE: Multiplanar, multiecho pulse sequences of the brain and surrounding
structures were obtained without intravenous contrast.

[Series 2: T1 · sagittal · 5.0mm · 0.45mm/px · 3 of 23 slices shown]
[im 1/23]
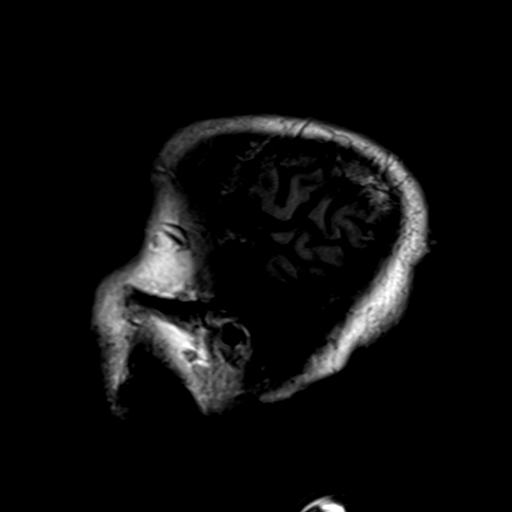
[im 12/23]
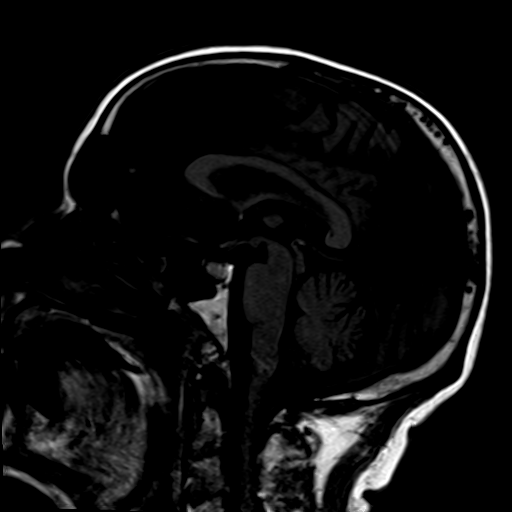
[im 23/23]
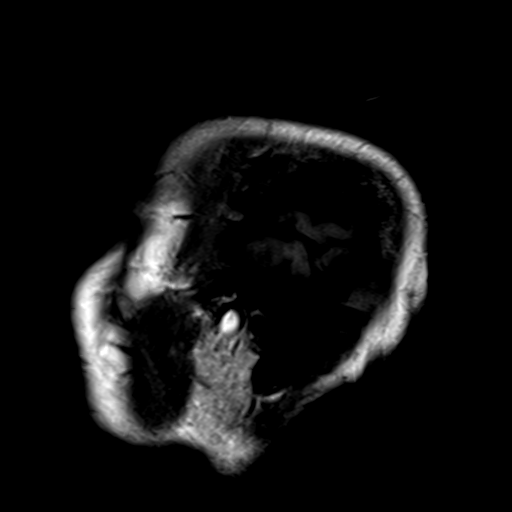

[Series 4: DWI · axial · 3.0mm · 1.80mm/px · z∈[+0,+148]mm · 7 of 55 slices shown (1 of 2)]
[im 1/55]
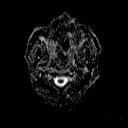
[im 10/55]
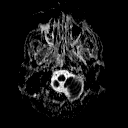
[im 19/55]
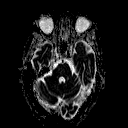
[im 28/55]
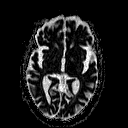
[im 37/55]
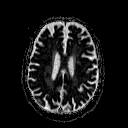
[im 46/55]
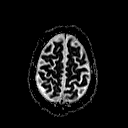
[im 55/55]
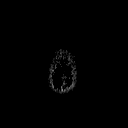

[Series 6: DWI · coronal · 3.0mm · 1.80mm/px · 6 of 45 slices shown (2 of 2)]
[im 1/45]
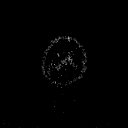
[im 9/45]
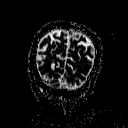
[im 18/45]
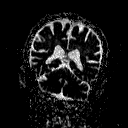
[im 27/45]
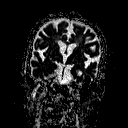
[im 36/45]
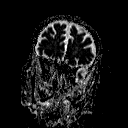
[im 45/45]
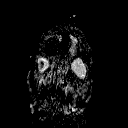

[Series 7: T2 · axial · 5.0mm · 0.45mm/px · z∈[-0,+143]mm · 3 of 25 slices shown (1 of 3)]
[im 1/25]
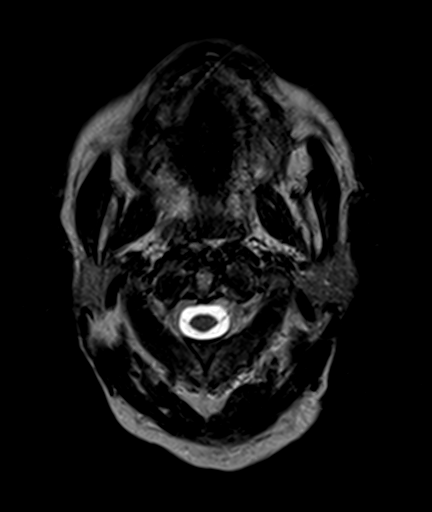
[im 13/25]
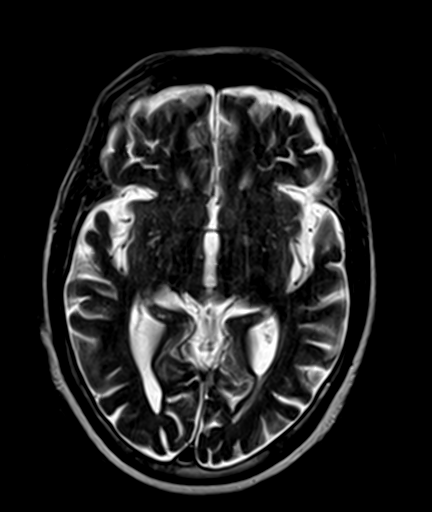
[im 25/25]
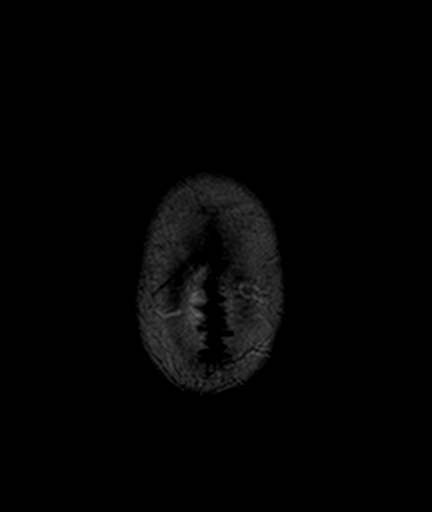

[Series 8: FLAIR · axial · 3.0mm · 0.45mm/px · z∈[-0,+142]mm · 7 of 53 slices shown]
[im 1/53]
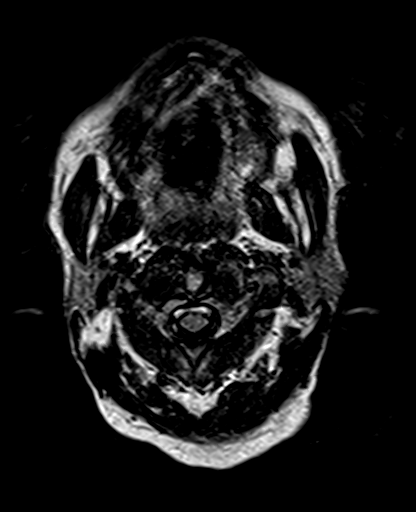
[im 9/53]
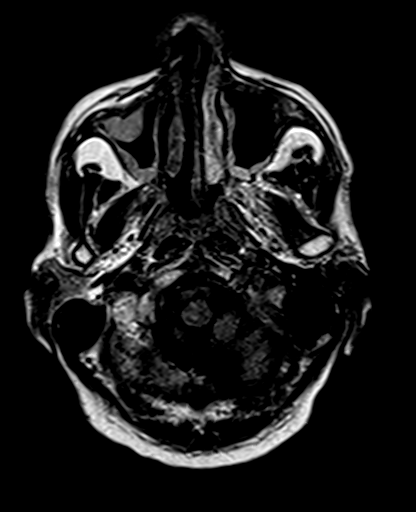
[im 18/53]
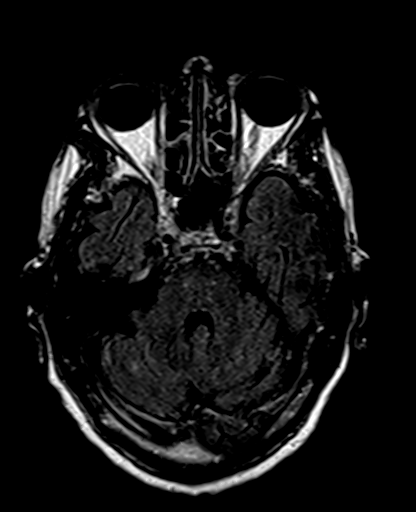
[im 27/53]
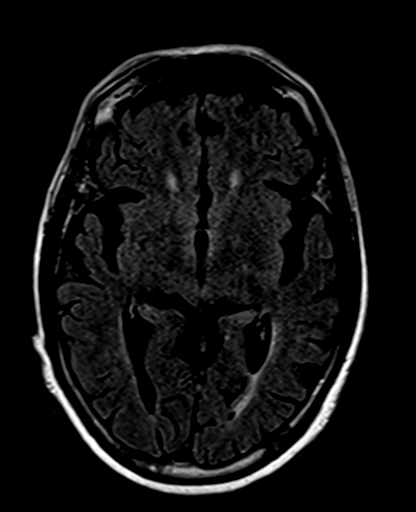
[im 35/53]
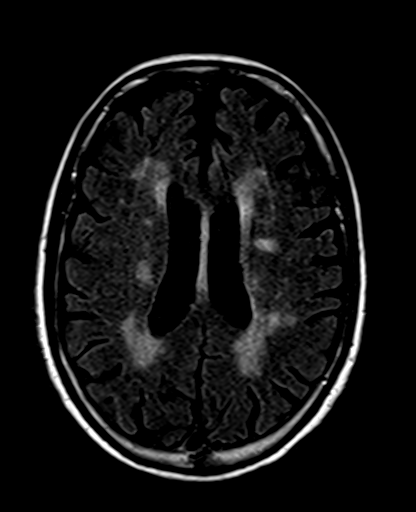
[im 44/53]
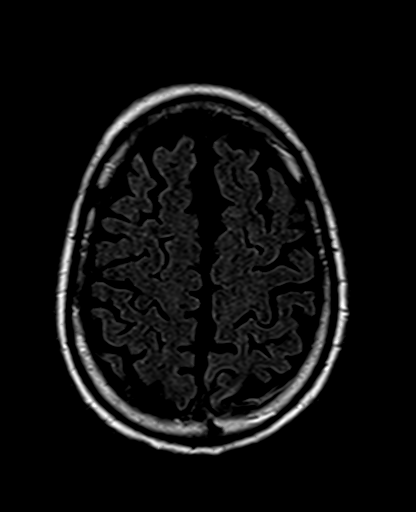
[im 53/53]
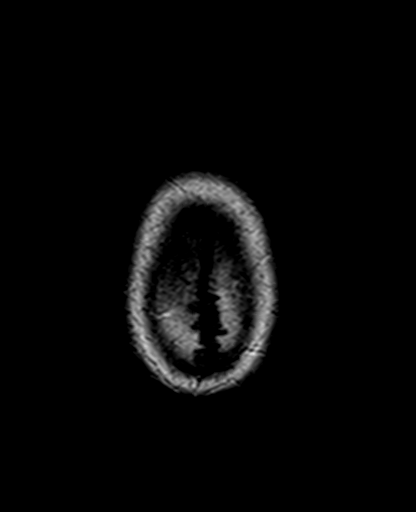

[Series 9: T2 · axial · 5.0mm · 1.20mm/px · z∈[+2,+144]mm · 3 of 25 slices shown (2 of 3)]
[im 1/25]
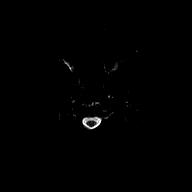
[im 13/25]
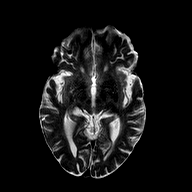
[im 25/25]
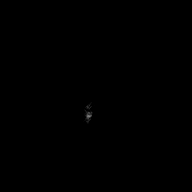

[Series 10: GRE · axial · 5.0mm · 0.45mm/px · z∈[+2,+144]mm · 3 of 25 slices shown]
[im 1/25]
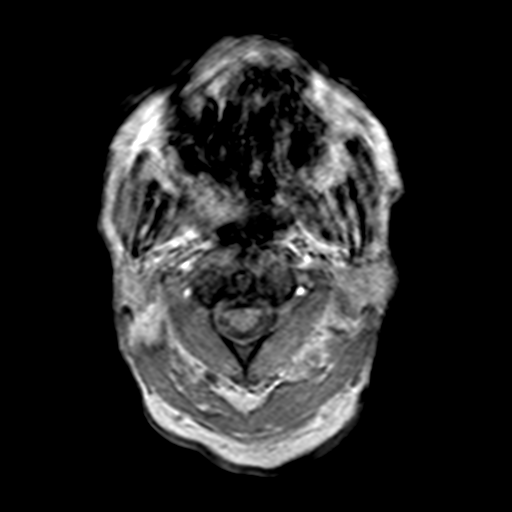
[im 13/25]
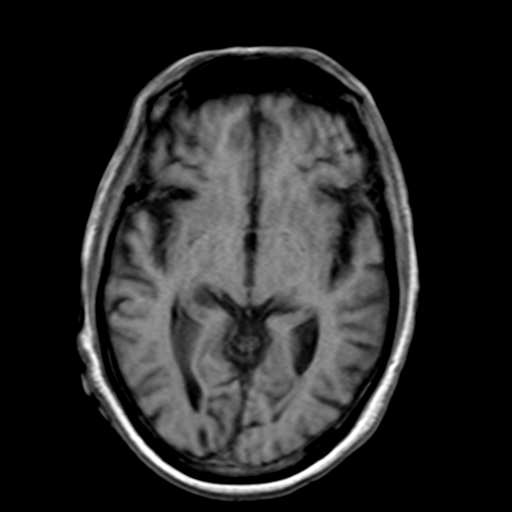
[im 25/25]
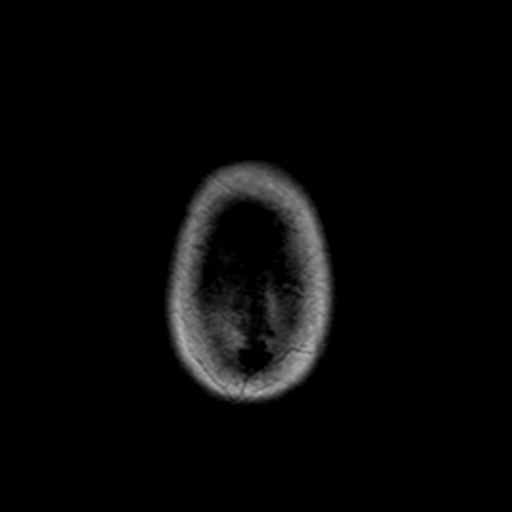

[Series 11: T2 · coronal · 5.0mm · 0.43mm/px · 3 of 25 slices shown (3 of 3)]
[im 1/25]
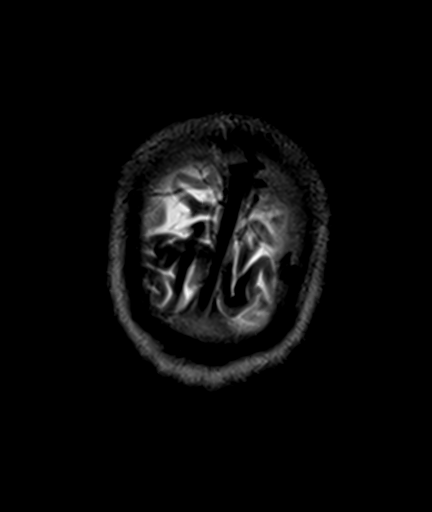
[im 13/25]
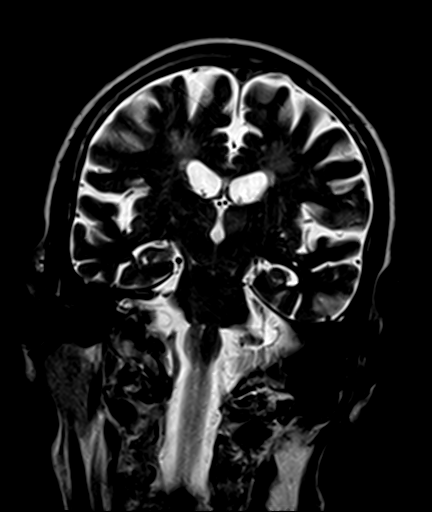
[im 25/25]
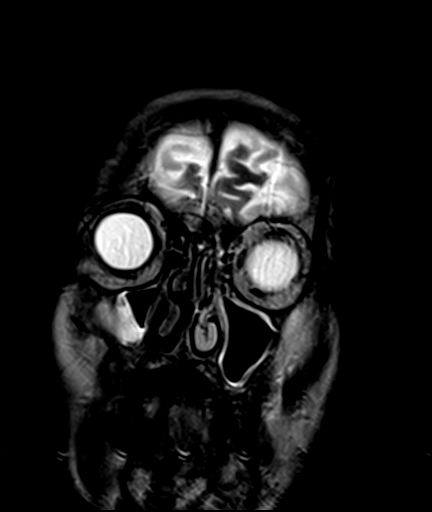

[35 of 48 positions shown; findings below may reference images not displayed]

FINDINGS: Brain: Examination technically limited by motion artifact.

Diffuse prominence of the CSF containing spaces compatible
generalized age-related cerebral atrophy. Patchy and confluent
T2/FLAIR hyperintensity within the periventricular and deep white
matter both cerebral hemispheres most compatible chronic small
vessel ischemic change, moderate nature. Scatter remote lacunar
infarcts present within the bilateral thalami.

Linear diffusion abnormality involving the central cerebellar vermis
consistent with an acute to early subacute ischemic infarct (series
100, image 21). Area of infarction measures approximately 22 mm in
length, extending to involve the roof of the fourth ventricle. No
associated hemorrhage or mass effect. No other evidence for acute or
subacute ischemia. Gray-white matter differentiation otherwise
maintained. No evidence for acute or chronic intracranial
hemorrhage.

No mass lesion, midline shift or mass effect. No hydrocephalus. No
extra-axial fluid collection. Pituitary gland within normal limits.

Vascular: Major intracranial vascular flow voids are maintained.

Skull and upper cervical spine: Craniocervical junction within
normal limits. Upper cervical spine demonstrates no acute
abnormality. Degenerative spondylolysis at C3-4 without significant
stenosis. No focal marrow replacing lesion. Scalp soft tissues
unremarkable.

Sinuses/Orbits: Globes and orbital soft tissues demonstrate no acute
finding. Scattered mucosal thickening throughout the ethmoidal air
cells and maxillary sinuses. No air-fluid levels to suggest acute
sinusitis. Mastoids are clear. Inner ear structures normal.

Other: None.
IMPRESSION: 1. Approximate 2 cm acute to early subacute ischemic nonhemorrhagic
infarct involving the mid cerebellar vermis.
2. Generalized age-related cerebral atrophy with moderate chronic
small vessel ischemic disease, with remote bilateral thalamic
lacunar infarcts.

## 2019-08-08 ENCOUNTER — Other Ambulatory Visit: Payer: Self-pay | Admitting: *Deleted

## 2019-08-08 DIAGNOSIS — D751 Secondary polycythemia: Secondary | ICD-10-CM

## 2019-08-13 NOTE — Progress Notes (Signed)
[facility]  [address] [place], [place] [address] Phone: [phone]  Fax: [phone]   Clinic Day:  08/14/2019  Referring physician: Lavera Guise, MD  Chief Complaint: Ian Conley is a 68 y.o. 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  HPI: The patient was last seen in the hematology clinic via telephone on 04/10/2019. At that time, his energy level had improved.  He had a good appetite.  He noted weight loss, but his weight at the time (different scale) indicated weight gain.  He was seen in gastroenterology by Dr. [name] on 07/26/2019. Dysphagia had resolved. He was advised to follow-up with his PCP regarding stool testing in lieu of a colonoscopy. He was to follow-up prn.   During the interim, he notes "nothing to complain about".  He feels pretty good.  He notes ongoing chronic numbness and tingling in his left hand.  He feels that he has lost weight.  Weight is up 6 pounds since 11/2018 when he was 131 pounds; weight is down 1 pound since 06/2019.   He is eating well with (hamburgers, fish sandwiches.  He plans to do Cologuard.  He is still smoking 1 pack a day and is "getting ready to cut back".   Past Medical History:  Diagnosis Date  . Allergy   . Anemia   . Hypertension   . Pancreatitis   . Personal history of tobacco use, presenting hazards to health 03/25/2016  . Polycythemia   . Stroke [facility])    2008 9/11    History reviewed. No pertinent surgical history.  Family History  Problem Relation Age of Onset  . Diabetes Mother   . Heart failure Mother   . Cancer Father        Throat   . Stroke Father   . Hypertension Father   . HIV Brother   . Thyroid disease Sister        thyroid cancer  . Hypertension Sister        both sisters    Social History:  reports that he has been smoking cigarettes. He has a 45.00 pack-year smoking history. He has never used smokeless tobacco. He reports  previous alcohol use. He reports that he does not use drugs. He started smoking between the age of 76-18.  He smoked 1 pack a day for 48 years.  He is smoking 1 pack a day.  He drinks 2- 24 ounce beers/day.  He was in [employer] for 3 years.  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  He lives in [place].  The patient alone today.  His sister, [name], is on the phone.  Allergies:  Allergies  Allergen Reactions  . Penicillins Rash    Has patient had a PCN reaction causing immediate rash, facial/tongue/throat swelling, SOB or lightheadedness with hypotension: Yes Has patient had a PCN reaction causing severe rash involving mucus membranes or skin necrosis: Unknown Has patient had a PCN reaction that required hospitalization: No Has patient had a PCN reaction occurring within the last 10 years: No If all of the above answers are "NO", then may proceed with Cephalosporin use.    Current Medications: Current Outpatient Medications  Medication Sig Dispense Refill  . amLODipine (NORVASC) 10 MG tablet Take 1 tablet (10 mg total) by mouth daily. 90 tablet 1  . atorvastatin (LIPITOR) 80 MG tablet Take 1 tablet (80 mg total) by mouth daily  at 6 PM. 90 tablet 1  . dipyridamole-aspirin (AGGRENOX) 200-25 MG 12hr capsule Take 1 capsule by mouth 2 (two) times daily. 180 capsule 1  . Donepezil HCl (ARICEPT PO) Take 1 tablet by mouth daily.     . Loratadine (CLARITIN PO) Take 1 tablet by mouth.     Marland Kitchen LORazepam (ATIVAN) 0.5 MG tablet Take 1/2 to 1 tablet po BID prn agitation. 60 tablet 2  . mirtazapine (REMERON) 7.5 MG tablet Take 1 tablet (7.5 mg total) by mouth at bedtime. 30 tablet 3  . Multiple Vitamins-Minerals (YOUR LIFE MULTI MENS 50+) TABS Take by mouth.    . pantoprazole (PROTONIX) 20 MG tablet Take 1 tablet (20 mg total) by mouth 2 (two) times daily before a meal. 60 tablet 0  . polyethylene glycol powder (GLYCOLAX/MIRALAX) powder as needed.   0   No current  facility-administered medications for this visit.     Review of Systems  Constitutional: Negative for chills, diaphoresis, fever, malaise/fatigue and weight loss (down 1 pound in past month; up 6 pounds from 11/2018).       "Nothing to complain about".  Feels "pretty good".  HENT: Negative.  Negative for congestion, ear discharge, ear pain, nosebleeds, sinus pain and sore throat.        "My teeth are coming out".  Eyes: Negative.  Negative for blurred vision, double vision, photophobia, pain, discharge and redness.  Respiratory: Negative.  Negative for hemoptysis, sputum production, shortness of breath and wheezing.   Cardiovascular: Negative.  Negative for chest pain, palpitations, orthopnea, leg swelling and PND.  Gastrointestinal: Negative.  Negative for abdominal pain, blood in stool, constipation, diarrhea, heartburn, melena, nausea and vomiting.       Appetite is good.  Oral intake affected by dentition.  Genitourinary: Negative.  Negative for dysuria, frequency, hematuria and urgency.  Musculoskeletal: Negative.  Negative for back pain, falls, joint pain, myalgias and neck pain.  Skin: Negative.  Negative for itching.  Neurological: Positive for tingling (left hand tingly at times) and weakness (generalized). Negative for dizziness, tremors, speech change, focal weakness, seizures and headaches.       S/p CVA.  Endo/Heme/Allergies: Negative.  Does not bruise/bleed easily.  Psychiatric/Behavioral: Positive for memory loss. Negative for depression. The patient is not nervous/anxious and does not have insomnia.   All other systems reviewed and are negative.  Performance status (ECOG): 2-3  Vitals Blood pressure 107/65, pulse 75, temperature 98.5 F (36.9 C), temperature source Tympanic, resp. rate 18, height 5\' 11"  (1.803 m), weight 137 lb 3.8 oz (62.2 kg), SpO2 99 %.   Physical Exam  Constitutional: He is oriented to person, place, and time. He appears well-developed and  well-nourished. No distress.  Elderly gentleman sitting comfortably in exam room in no acute distress.  He has a cane by his side.  HENT:  Head: Normocephalic and atraumatic.  Mouth/Throat: Oropharynx is clear and moist. No oropharyngeal exudate.  Black cap and mask.  Gray hair and beard.  Poor dentition.  Eyes: Pupils are equal, round, and reactive to light. Conjunctivae and EOM are normal. No scleral icterus.  Brown eyes.  Neck: Normal range of motion. Neck supple. No JVD present.  Cardiovascular: Normal rate, regular rhythm and normal heart sounds.  No murmur heard. Pulmonary/Chest: Breath sounds normal. No respiratory distress. He has no wheezes. He has no rales.  Abdominal: Soft. Bowel sounds are normal. He exhibits no distension and no mass. There is no abdominal tenderness. There is no rebound and  no guarding.  Musculoskeletal: Normal range of motion.        General: No edema.  Lymphadenopathy:    He has no cervical adenopathy.    He has no axillary adenopathy.       Right: No supraclavicular adenopathy present.       Left: No supraclavicular adenopathy present.  Neurological: He is alert and oriented to person, place, and time.  Skin: Skin is warm and dry. No rash noted. He is not diaphoretic. No erythema. No pallor.  Psychiatric: He has a normal mood and affect. His behavior is normal. Judgment and thought content normal.  Nursing note and vitals reviewed.   Appointment on 08/14/2019  Component Date Value Ref Range Status  . Sodium 08/14/2019 140  135 - 145 mmol/L Final  . Potassium 08/14/2019 3.7  3.5 - 5.1 mmol/L Final  . Chloride 08/14/2019 106  98 - 111 mmol/L Final  . CO2 08/14/2019 26  22 - 32 mmol/L Final  . Glucose, Bld 08/14/2019 113* 70 - 99 mg/dL Final  . BUN 08/14/2019 6* 8 - 23 mg/dL Final  . Creatinine, Ser 08/14/2019 0.98  0.61 - 1.24 mg/dL Final  . Calcium 08/14/2019 9.1  8.9 - 10.3 mg/dL Final  . Total Protein 08/14/2019 6.8  6.5 - 8.1 g/dL Final  .  Albumin 08/14/2019 3.9  3.5 - 5.0 g/dL Final  . AST 08/14/2019 22  15 - 41 U/L Final  . ALT 08/14/2019 20  0 - 44 U/L Final  . Alkaline Phosphatase 08/14/2019 104  38 - 126 U/L Final  . Total Bilirubin 08/14/2019 0.5  0.3 - 1.2 mg/dL Final  . GFR calc non Af Amer 08/14/2019 >60  >60 mL/min Final  . GFR calc Af Amer 08/14/2019 >60  >60 mL/min Final  . Anion gap 08/14/2019 8  5 - 15 Final   Performed at [facility], [address], [place], [place] [address]  . WBC 08/14/2019 5.6  4.0 - 10.5 K/uL Final  . RBC 08/14/2019 4.74  4.22 - 5.81 MIL/uL Final  . Hemoglobin 08/14/2019 14.2  13.0 - 17.0 g/dL Final  . HCT 08/14/2019 41.8  39.0 - 52.0 % Final  . MCV 08/14/2019 88.2  80.0 - 100.0 fL Final  . MCH 08/14/2019 30.0  26.0 - 34.0 pg Final  . MCHC 08/14/2019 34.0  30.0 - 36.0 g/dL Final  . RDW 08/14/2019 14.2  11.5 - 15.5 % Final  . Platelets 08/14/2019 178  150 - 400 K/uL Final  . nRBC 08/14/2019 0.0  0.0 - 0.2 % Final  . Neutrophils Relative % 08/14/2019 53  % Final  . Neutro Abs 08/14/2019 3.0  1.7 - 7.7 K/uL Final  . Lymphocytes Relative 08/14/2019 35  % Final  . Lymphs Abs 08/14/2019 2.0  0.7 - 4.0 K/uL Final  . Monocytes Relative 08/14/2019 11  % Final  . Monocytes Absolute 08/14/2019 0.6  0.1 - 1.0 K/uL Final  . Eosinophils Relative 08/14/2019 1  % Final  . Eosinophils Absolute 08/14/2019 0.1  0.0 - 0.5 K/uL Final  . Basophils Relative 08/14/2019 0  % Final  . Basophils Absolute 08/14/2019 0.0  0.0 - 0.1 K/uL Final  . Immature Granulocytes 08/14/2019 0  % Final  . Abs Immature Granulocytes 08/14/2019 0.01  0.00 - 0.07 K/uL Final   Performed at [facility], [address], [place], [place] [address]    Assessment:  Ian Conley is a 68 y.o. male with  secondary polycythemia first noted on 03/10/2016.  He has a 48 pack year smoking history.  He denies any cardiac disease, sleep apnea, or testosterone use.  He had a CVA on 09/01/2007 and 07/13/2018.   Work-up on 03/19/2016 revealed a hematocrit of 57.3, hemoglobin 19.5, MCV 91.5, platelets 144,000, and WBC 5000.  Erythropoietin level was 3.0 (2.6-18.5).  Repeat epo level was 6.8 on 05/05/2016.  JAK2 was negative for V617F and exon 12.  Ferritin was 181.  Iron studies revealed a saturation of 41% and a TIBC of 333.  Low dose spiral chest CT scan on 03/25/2016 was negative.  There was notation of a thickened area in the distal stomach or pyloric region.  Chest and abdomen CT on 08/09/2018 revealed moderate to advanced centrilobular and paraseptal emphysema.  There was biapical and bibasilar scarring.  There was scattered aortic atherosclerosis. No aneurysm. There was no acute cardiopulmonary disease.  There was mild fatty infiltration of the liver.  There was no gastric abnormality by CT.  There was no acute findings in the abdomen.  He has an elevated alkaline phosphatase (139).  PSA was 0.4.  He has never had a colonoscopy.  He undergoes small volume phlebotomy (250-300 cc) if his hematocrit is > 52.  Last phlebotomy was 08/06/2016.  Symptomatically, he appears to be doing well.  He denies any complaints.  Exam is stable.  Plan: 1.   Labs today: CBC with diff, CMP, ferritin.  2.   Secondary polycythemia, resolved  Hematocrit 41.8.  Hemoglobin 14.2.  He continues to smoke 1 pack a day but is planning on "cutting back".    Encourage smoking cessation  No phlebotomy today.    Discuss concern for normal hematocrit with ongoing smoking.   Question subtle blood loss.   Patient denies any melena, hematochezia or hematuria.   Ferritin 391 today (? acute phase reactant).   Patient notes plan to do Cologuard. 3.   Weight loss  Appetite remains good.              Weight clearly improved from 11/2018, but down slightly from 1 month ago.             Continue to monitor. 4.   RTC in 6 months for MD assessment and labs (CBC with diff, ferritin, iron studies, sed rate).  I discussed the  assessment and treatment plan with the patient.  The patient was provided an opportunity to ask questions and all were answered.  The patient agreed with the plan and demonstrated an understanding of the instructions.  The patient was advised to call back if the symptoms worsen or if the condition fails to improve as anticipated.   Lequita Asal, MD, PhD    08/14/2019, 11:17 AM

## 2019-08-13 NOTE — Progress Notes (Signed)
Confirmed Name, DOB, and Address. Assessment completed with assistance of patient's sister, [name]. Reports patient continues to complain of right arm numbness. Patient is currently under the care of his neurologist for this. Denies any concerns.

## 2019-08-14 ENCOUNTER — Inpatient Hospital Stay: Payer: Medicare Other | Attending: Hematology and Oncology | Admitting: Hematology and Oncology

## 2019-08-14 ENCOUNTER — Encounter: Payer: Self-pay | Admitting: Hematology and Oncology

## 2019-08-14 ENCOUNTER — Inpatient Hospital Stay: Payer: Medicare Other

## 2019-08-14 ENCOUNTER — Other Ambulatory Visit: Payer: Self-pay

## 2019-08-14 VITALS — BP 107/65 | HR 75 | Temp 98.5°F | Resp 18 | Ht 71.0 in | Wt 137.2 lb

## 2019-08-14 DIAGNOSIS — I10 Essential (primary) hypertension: Secondary | ICD-10-CM | POA: Insufficient documentation

## 2019-08-14 DIAGNOSIS — D751 Secondary polycythemia: Secondary | ICD-10-CM | POA: Diagnosis not present

## 2019-08-14 DIAGNOSIS — Z79899 Other long term (current) drug therapy: Secondary | ICD-10-CM | POA: Insufficient documentation

## 2019-08-14 DIAGNOSIS — R202 Paresthesia of skin: Secondary | ICD-10-CM | POA: Diagnosis not present

## 2019-08-14 DIAGNOSIS — R200 Anesthesia of skin: Secondary | ICD-10-CM | POA: Diagnosis not present

## 2019-08-14 DIAGNOSIS — F17210 Nicotine dependence, cigarettes, uncomplicated: Secondary | ICD-10-CM | POA: Diagnosis not present

## 2019-08-14 DIAGNOSIS — Z7982 Long term (current) use of aspirin: Secondary | ICD-10-CM | POA: Insufficient documentation

## 2019-08-14 DIAGNOSIS — Z8673 Personal history of transient ischemic attack (TIA), and cerebral infarction without residual deficits: Secondary | ICD-10-CM | POA: Insufficient documentation

## 2019-08-14 DIAGNOSIS — R634 Abnormal weight loss: Secondary | ICD-10-CM | POA: Diagnosis not present

## 2019-08-14 DIAGNOSIS — D649 Anemia, unspecified: Secondary | ICD-10-CM | POA: Insufficient documentation

## 2019-08-14 LAB — COMPREHENSIVE METABOLIC PANEL
ALT: 20 U/L (ref 0–44)
AST: 22 U/L (ref 15–41)
Albumin: 3.9 g/dL (ref 3.5–5.0)
Alkaline Phosphatase: 104 U/L (ref 38–126)
Anion gap: 8 (ref 5–15)
BUN: 6 mg/dL — ABNORMAL LOW (ref 8–23)
CO2: 26 mmol/L (ref 22–32)
Calcium: 9.1 mg/dL (ref 8.9–10.3)
Chloride: 106 mmol/L (ref 98–111)
Creatinine, Ser: 0.98 mg/dL (ref 0.61–1.24)
GFR calc Af Amer: >60 mL/min (ref >60)
GFR calc non Af Amer: >60 mL/min (ref >60)
Glucose, Bld: 113 mg/dL — ABNORMAL HIGH (ref 70–99)
Potassium: 3.7 mmol/L (ref 3.5–5.1)
Sodium: 140 mmol/L (ref 135–145)
Total Bilirubin: 0.5 mg/dL (ref 0.3–1.2)
Total Protein: 6.8 g/dL (ref 6.5–8.1)

## 2019-08-14 LAB — CBC WITH DIFFERENTIAL/PLATELET
Abs Immature Granulocytes: 0.01 10*3/uL (ref 0.00–0.07)
Basophils Absolute: 0 10*3/uL (ref 0.0–0.1)
Basophils Relative: 0 %
Eosinophils Absolute: 0.1 10*3/uL (ref 0.0–0.5)
Eosinophils Relative: 1 %
HCT: 41.8 % (ref 39.0–52.0)
Hemoglobin: 14.2 g/dL (ref 13.0–17.0)
Immature Granulocytes: 0 %
Lymphocytes Relative: 35 %
Lymphs Abs: 2 10*3/uL (ref 0.7–4.0)
MCH: 30 pg (ref 26.0–34.0)
MCHC: 34 g/dL (ref 30.0–36.0)
MCV: 88.2 fL (ref 80.0–100.0)
Monocytes Absolute: 0.6 10*3/uL (ref 0.1–1.0)
Monocytes Relative: 11 %
Neutro Abs: 3 10*3/uL (ref 1.7–7.7)
Neutrophils Relative %: 53 %
Platelets: 178 10*3/uL (ref 150–400)
RBC: 4.74 MIL/uL (ref 4.22–5.81)
RDW: 14.2 % (ref 11.5–15.5)
WBC: 5.6 10*3/uL (ref 4.0–10.5)
nRBC: 0 % (ref 0.0–0.2)

## 2019-08-14 LAB — FERRITIN: Ferritin: 391 ng/mL — ABNORMAL HIGH (ref 24–336)

## 2019-08-14 NOTE — Progress Notes (Signed)
No new changes noted today 

## 2019-08-21 ENCOUNTER — Other Ambulatory Visit: Payer: Self-pay

## 2019-08-21 DIAGNOSIS — R200 Anesthesia of skin: Secondary | ICD-10-CM | POA: Diagnosis not present

## 2019-08-21 DIAGNOSIS — R413 Other amnesia: Secondary | ICD-10-CM | POA: Diagnosis not present

## 2019-08-21 DIAGNOSIS — M4802 Spinal stenosis, cervical region: Secondary | ICD-10-CM | POA: Diagnosis not present

## 2019-08-21 DIAGNOSIS — F321 Major depressive disorder, single episode, moderate: Secondary | ICD-10-CM

## 2019-08-21 MED ORDER — MIRTAZAPINE 7.5 MG PO TABS: 7.5000 mg | ORAL_TABLET | Freq: Every day | ORAL | 3 refills | Status: DC

## 2019-08-23 ENCOUNTER — Telehealth: Payer: Self-pay | Admitting: *Deleted

## 2019-08-23 NOTE — Telephone Encounter (Signed)
Call placed to patient to notify him that lung cancer screening CT scan is due currently or will be in the near future. Patients sister [name] answered and states she takes care of appointments and updates for patient. I confirmed with patients sister that he is within the appropriate age range and currently asymptomatic ( no signs or symptoms of lung cancer). She also denies that patient has any significant change in medical history or illness that would prevent curative treatment for lung cancer if found. Verified smoking history (Current smoker 2.5 ppd). Patients sister agreeable for CT scan being scheduled, she prefers patient have appt after 12:00 pm if possible.

## 2019-08-24 ENCOUNTER — Other Ambulatory Visit: Payer: Self-pay | Admitting: *Deleted

## 2019-08-24 DIAGNOSIS — Z87891 Personal history of nicotine dependence: Secondary | ICD-10-CM

## 2019-08-24 DIAGNOSIS — Z122 Encounter for screening for malignant neoplasm of respiratory organs: Secondary | ICD-10-CM

## 2019-08-24 NOTE — Telephone Encounter (Signed)
Current smoker, 48.5 pack year

## 2019-08-25 ENCOUNTER — Other Ambulatory Visit: Payer: Self-pay

## 2019-08-25 MED ORDER — ATORVASTATIN CALCIUM 80 MG PO TABS: 80.0000 mg | ORAL_TABLET | Freq: Every day | ORAL | 1 refills | Status: DC

## 2019-08-30 ENCOUNTER — Other Ambulatory Visit: Payer: Self-pay

## 2019-08-30 ENCOUNTER — Ambulatory Visit
Admission: RE | Admit: 2019-08-30 | Discharge: 2019-08-30 | Disposition: A | Discharge status: Home / Self Care | Payer: Medicare Other | Source: Ambulatory Visit | Attending: Oncology | Admitting: Oncology

## 2019-08-30 DIAGNOSIS — Z122 Encounter for screening for malignant neoplasm of respiratory organs: Secondary | ICD-10-CM

## 2019-08-30 DIAGNOSIS — Z87891 Personal history of nicotine dependence: Secondary | ICD-10-CM | POA: Diagnosis not present

## 2019-09-01 ENCOUNTER — Encounter: Payer: Self-pay | Admitting: *Deleted

## 2019-09-06 DIAGNOSIS — G5622 Lesion of ulnar nerve, left upper limb: Secondary | ICD-10-CM | POA: Diagnosis not present

## 2019-09-07 ENCOUNTER — Other Ambulatory Visit: Payer: Self-pay

## 2019-09-07 MED ORDER — ASPIRIN-DIPYRIDAMOLE ER 25-200 MG PO CP12: 1.0000 | ORAL_CAPSULE | Freq: Two times a day (BID) | ORAL | 1 refills | Status: DC

## 2019-10-23 ENCOUNTER — Other Ambulatory Visit: Payer: Self-pay

## 2019-10-23 ENCOUNTER — Encounter: Payer: Self-pay | Admitting: Nurse Practitioner

## 2019-10-23 ENCOUNTER — Ambulatory Visit (INDEPENDENT_AMBULATORY_CARE_PROVIDER_SITE_OTHER): Payer: Medicare Other | Admitting: Nurse Practitioner

## 2019-10-23 VITALS — BP 106/64 | HR 80 | Temp 98.4°F | Resp 16 | Ht 71.0 in | Wt 137.0 lb

## 2019-10-23 DIAGNOSIS — F0151 Vascular dementia with behavioral disturbance: Secondary | ICD-10-CM

## 2019-10-23 DIAGNOSIS — D696 Thrombocytopenia, unspecified: Secondary | ICD-10-CM | POA: Diagnosis not present

## 2019-10-23 DIAGNOSIS — I10 Essential (primary) hypertension: Secondary | ICD-10-CM | POA: Diagnosis not present

## 2019-10-23 DIAGNOSIS — E441 Mild protein-calorie malnutrition: Secondary | ICD-10-CM

## 2019-10-23 DIAGNOSIS — Z23 Encounter for immunization: Secondary | ICD-10-CM

## 2019-10-23 DIAGNOSIS — Z0001 Encounter for general adult medical examination with abnormal findings: Secondary | ICD-10-CM

## 2019-10-23 DIAGNOSIS — Z1211 Encounter for screening for malignant neoplasm of colon: Secondary | ICD-10-CM

## 2019-10-23 DIAGNOSIS — R300 Dysuria: Secondary | ICD-10-CM

## 2019-10-23 DIAGNOSIS — J439 Emphysema, unspecified: Secondary | ICD-10-CM

## 2019-10-23 DIAGNOSIS — I69359 Hemiplegia and hemiparesis following cerebral infarction affecting unspecified side: Secondary | ICD-10-CM

## 2019-10-23 DIAGNOSIS — F01518 Vascular dementia, unspecified severity, with other behavioral disturbance: Secondary | ICD-10-CM

## 2019-10-23 MED ORDER — LORAZEPAM 0.5 MG PO TABS: ORAL_TABLET | ORAL | 2 refills | Status: DC

## 2019-10-23 MED ORDER — PNEUMOCOCCAL 13-VAL CONJ VACC IM SUSP: 0.5000 mL | Freq: Once | INTRAMUSCULAR | 0 refills | Status: AC

## 2019-10-23 NOTE — Progress Notes (Signed)
[facility], [place] [address]  Internal MEDICINE  Office Visit Note  Patient Name: Ian Conley  [id number]  TO:[id number]  Date of Service: 10/24/2019  Chief Complaint  Patient presents with  . Annual Exam  . Hypertension  . Anemia  . Quality Metric Gaps    pna vacc and colonoscopy   . Medication Refill    lorazepam     The patient is here for health maintenance exam. He states that he continues to have poor appetite. States it's just not as robust as it used to be prior to his recent stroke. His weight is relatively stable with just a one pound weight loss since his last visit. States that he is having problems with his teeth. They continue to fall out. Has seen dentist and dentist does not want to pull them out, but will fit him for dentures once they have fallen out. Problems with teeth may be contributing to issue with poor appetite and problems with eating. He states that he is, otherwise, feeling well. Blood pressure is well controlled. He is due to have routine, fasting labs, initial pneumonia vaccine, and screening colonoscopy. His father had colon cancer, so he is not a candidate to have Cologuard to screen for colorectal cancer.     Current Medication: Outpatient Encounter Medications as of 10/23/2019  Medication Sig  . amLODipine (NORVASC) 10 MG tablet Take 1 tablet (10 mg total) by mouth daily.  Marland Kitchen atorvastatin (LIPITOR) 80 MG tablet Take 1 tablet (80 mg total) by mouth daily at 6 PM.  . dipyridamole-aspirin (AGGRENOX) 200-25 MG 12hr capsule Take 1 capsule by mouth 2 (two) times daily.  Marland Kitchen donepezil (ARICEPT) 10 MG tablet Take 1 tablet by mouth daily.   . Loratadine (CLARITIN PO) Take 1 tablet by mouth daily.   Marland Kitchen LORazepam (ATIVAN) 0.5 MG tablet Take 1/2 to 1 tablet po BID prn agitation.  . mirtazapine (REMERON) 7.5 MG tablet Take 1 tablet (7.5 mg total) by mouth at bedtime.  . Multiple Vitamins-Minerals (YOUR LIFE MULTI MENS 50+) TABS  Take by mouth.  . pantoprazole (PROTONIX) 20 MG tablet Take 1 tablet (20 mg total) by mouth 2 (two) times daily before a meal.  . polyethylene glycol powder (GLYCOLAX/MIRALAX) powder as needed.   . [DISCONTINUED] LORazepam (ATIVAN) 0.5 MG tablet Take 1/2 to 1 tablet po BID prn agitation.  . [EXPIRED] pneumococcal 13-valent conjugate vaccine (PREVNAR 13) SUSP injection Inject 0.5 mLs into the muscle once for 1 dose.   No facility-administered encounter medications on file as of 10/23/2019.     Surgical History: History reviewed. No pertinent surgical history.  Medical History: Past Medical History:  Diagnosis Date  . Allergy   . Anemia   . Hypertension   . Pancreatitis   . Personal history of tobacco use, presenting hazards to health 03/25/2016  . Polycythemia   . Stroke [facility])    2008 9/11    Family History: Family History  Problem Relation Age of Onset  . Diabetes Mother   . Heart failure Mother   . Cancer Father        Throat   . Stroke Father   . Hypertension Father   . HIV Brother   . Thyroid disease Sister        thyroid cancer  . Hypertension Sister        both sisters      Review of Systems  Constitutional: Negative for activity change, appetite change, chills,  fatigue and unexpected weight change.  HENT: Negative for congestion, rhinorrhea, sinus pressure, sinus pain, sneezing and sore throat.   Respiratory: Negative for cough, chest tightness, shortness of breath and wheezing.   Cardiovascular: Negative for chest pain and palpitations.  Gastrointestinal: Negative for abdominal pain, constipation, diarrhea, nausea and vomiting.  Endocrine: Negative for cold intolerance, heat intolerance, polydipsia and polyuria.  Genitourinary: Negative for dysuria, frequency and urgency.  Musculoskeletal: Positive for myalgias. Negative for arthralgias, back pain, gait problem, joint swelling and neck pain.       Left arm and hand weakness.   Skin: Negative for rash.   Allergic/Immunologic: Negative for environmental allergies.  Neurological: Positive for facial asymmetry and weakness. Negative for tremors, speech difficulty and numbness.       The left sided weakness is persistent. Had sudden onset of left arm weakness and numbness while waiting to be seen. Shaking out the hand and arm generally makes this feeling better.   Hematological: Negative for adenopathy. Does not bruise/bleed easily.  Psychiatric/Behavioral: Positive for dysphoric mood. Negative for behavioral problems, sleep disturbance and suicidal ideas. The patient is nervous/anxious.        Improved with mirtazapine every evening and lorazepam as needed for agitation.      Today's Vitals   10/23/19 1344  BP: 106/64  Pulse: 80  Resp: 16  Temp: 98.4 F (36.9 C)  SpO2: 98%  Weight: 137 lb (62.1 kg)  Height: 5\' 11"  (1.803 m)   Body mass index is 19.11 kg/m.  Physical Exam Vitals signs and nursing note reviewed.  Constitutional:      General: He is not in acute distress.    Appearance: Normal appearance. He is well-developed. He is not diaphoretic.  HENT:     Head: Normocephalic and atraumatic.     Nose: Nose normal.     Mouth/Throat:     Pharynx: No oropharyngeal exudate.  Eyes:     Extraocular Movements: Extraocular movements intact.     Conjunctiva/sclera: Conjunctivae normal.     Pupils: Pupils are equal, round, and reactive to light.  Neck:     Musculoskeletal: Normal range of motion and neck supple.     Thyroid: No thyromegaly.     Vascular: No carotid bruit or JVD.     Trachea: No tracheal deviation.  Cardiovascular:     Rate and Rhythm: Normal rate and regular rhythm.     Pulses: Normal pulses.     Heart sounds: Murmur present. Systolic murmur present with a grade of 2/6. No friction rub. No gallop.   Pulmonary:     Effort: Pulmonary effort is normal. No respiratory distress.     Breath sounds: Normal breath sounds. No wheezing or rales.  Chest:     Chest wall:  No tenderness.  Abdominal:     General: Bowel sounds are normal.     Palpations: Abdomen is soft.     Tenderness: There is no abdominal tenderness. There is no guarding.  Musculoskeletal: Normal range of motion.     Comments: Left upper and lower extremity weakness. Has improved since his last visit. Walking with a cane rather than walker. No longer needing assistance.  Grips just slightly weaker on left side than right.   Lymphadenopathy:     Cervical: No cervical adenopathy.  Skin:    General: Skin is warm and dry.  Neurological:     Mental Status: He is alert and oriented to person, place, and time. Mental status is at baseline.  Cranial Nerves: No cranial nerve deficit.     Motor: Weakness present.     Gait: Gait abnormal.     Comments: Left upper and lower extremity weakness. Has improved since his last visit. Walking with a cane rather than walker. No longer needing assistance. Memory, attitude, and cognitive abilities have all improved since I last saw him.   Psychiatric:        Mood and Affect: Mood is depressed.        Speech: Speech is delayed.        Behavior: Behavior normal.        Thought Content: Thought content normal.        Cognition and Memory: He exhibits impaired recent memory.        Judgment: Judgment normal.    Depression screen [facility] 2/9 10/23/2019 06/26/2019 01/23/2019 10/20/2018 08/16/2018  Decreased Interest 0 0 0 0 3  Down, Depressed, Hopeless 0 0 0 0 3  PHQ - 2 Score 0 0 0 0 6  Altered sleeping - - - - 0  Tired, decreased energy - - - - 3  Change in appetite - - - - (No Data)  Feeling bad or failure about yourself  - - - - 0  Trouble concentrating - - - - 3  Moving slowly or fidgety/restless - - - - 1  Suicidal thoughts - - - - 0  PHQ-9 Score - - - - 13    Functional Status Survey: Is the patient deaf or have difficulty hearing?: No Does the patient have difficulty seeing, even when wearing glasses/contacts?: No Does the patient have difficulty  concentrating, remembering, or making decisions?: No Does the patient have difficulty walking or climbing stairs?: No Does the patient have difficulty dressing or bathing?: No Does the patient have difficulty doing errands alone such as visiting a doctor's office or shopping?: No  MMSE - [place] Exam 10/23/2019 10/20/2018  Orientation to time 5 5  Orientation to Place 5 5  Registration 3 3  Attention/ Calculation 5 5  Recall 3 3  Language- name 2 objects 2 2  Language- repeat 1 1  Language- follow 3 step command 3 3  Language- read & follow direction 1 1  Write a sentence 1 1  Copy design 1 1  Total score 30 30    Fall Risk  10/23/2019 06/26/2019 01/23/2019 10/20/2018 08/16/2018  Falls in the past year? 0 0 0 No Yes  Comment - - - - fell in bathroom at nursing facility, head laceration  Number falls in past yr: - - - - 1  Injury with Fall? - - - - Yes      LABS: Recent Results (from the past 2160 hour(s))  Ferritin     Status: Abnormal   Collection Time: 08/14/19 10:05 AM  Result Value Ref Range   Ferritin 391 (H) 24 - 336 ng/mL    Comment: Performed at [facility], [address] [place],  [address]  Comprehensive metabolic panel     Status: Abnormal   Collection Time: 08/14/19 10:05 AM  Result Value Ref Range   Sodium 140 135 - 145 mmol/L   Potassium 3.7 3.5 - 5.1 mmol/L   Chloride 106 98 - 111 mmol/L   CO2 26 22 - 32 mmol/L   Glucose, Bld 113 (H) 70 - 99 mg/dL   BUN 6 (L) 8 - 23 mg/dL   Creatinine, Ser 0.98 0.61 - 1.24 mg/dL   Calcium 9.1  8.9 - 10.3 mg/dL   Total Protein 6.8 6.5 - 8.1 g/dL   Albumin 3.9 3.5 - 5.0 g/dL   AST 22 15 - 41 U/L   ALT 20 0 - 44 U/L   Alkaline Phosphatase 104 38 - 126 U/L   Total Bilirubin 0.5 0.3 - 1.2 mg/dL   GFR calc non Af Amer >60 >60 mL/min   GFR calc Af Amer >60 >60 mL/min   Anion gap 8 5 - 15    Comment: Performed at [facility], [address] [place] [address]  CBC with  Differential     Status: None   Collection Time: 08/14/19 10:05 AM  Result Value Ref Range   WBC 5.6 4.0 - 10.5 K/uL   RBC 4.74 4.22 - 5.81 MIL/uL   Hemoglobin 14.2 13.0 - 17.0 g/dL   HCT 41.8 39.0 - 52.0 %   MCV 88.2 80.0 - 100.0 fL   MCH 30.0 26.0 - 34.0 pg   MCHC 34.0 30.0 - 36.0 g/dL   RDW 14.2 11.5 - 15.5 %   Platelets 178 150 - 400 K/uL   nRBC 0.0 0.0 - 0.2 %   Neutrophils Relative % 53 %   Neutro Abs 3.0 1.7 - 7.7 K/uL   Lymphocytes Relative 35 %   Lymphs Abs 2.0 0.7 - 4.0 K/uL   Monocytes Relative 11 %   Monocytes Absolute 0.6 0.1 - 1.0 K/uL   Eosinophils Relative 1 %   Eosinophils Absolute 0.1 0.0 - 0.5 K/uL   Basophils Relative 0 %   Basophils Absolute 0.0 0.0 - 0.1 K/uL   Immature Granulocytes 0 %   Abs Immature Granulocytes 0.01 0.00 - 0.07 K/uL    Comment: Performed at [facility], [address] [place], [place] [address]    Assessment/Plan: 1. Encounter for general adult medical examination with abnormal findings Annual health maintenance exam today.   2. Essential hypertension Stable. Continue bp medication as prescribed   3. Mild protein-calorie malnutrition ([place]) Improving. Appetite slightly improved. Weight loss has slowed. Will monitor closely   4. Hemiplegia affecting dominant side, post-stroke [facility]) Patient doing well and is stable. Will monitor closely.   5. Thrombocytopenia, unspecified ([place]) Regularly sees hematology for this.   6. Dementia, multiinfarct, with behavioral disturbance ([place]) May take lorazepam 0.5mg  up to twice daily if needed for agitation related to dementia. New prescription sent to his pharmacy.  - LORazepam (ATIVAN) 0.5 MG tablet; Take 1/2 to 1 tablet po BID prn agitation.  Dispense: 60 tablet; Refill: 2  7. Flu vaccine need - Flu Vaccine MDCK QUAD PF  8. Need for vaccination against Streptococcus pneumoniae using pneumococcal conjugate vaccine 13 rx for prevnar 13 sent to his pharmacy  - pneumococcal  13-valent conjugate vaccine (PREVNAR 13) SUSP injection; Inject 0.5 mLs into the muscle once for 1 dose.  Dispense: 0.5 mL; Refill: 0  9. Screening for colon cancer Refer to GI for screening colonoscopy.  - Ambulatory referral to Gastroenterology  10. Chronic pulmonary emphysema not affecting current episode of care ([place]) Stable without flare. Will monitor.     General Counseling: Matteo verbalizes understanding of the findings of todays visit and agrees with plan of treatment. I have discussed any further diagnostic evaluation that may be needed or ordered today. We also reviewed his medications today. he has been encouraged to call the office with any questions or concerns that should arise related to todays visit.    Counseling:  This patient was seen by Leretha Pol FNP Collaboration with Dr Lavera Guise as a part of collaborative care agreement  Orders Placed This Encounter  Procedures  . Flu Vaccine MDCK QUAD PF  . Ambulatory referral to Gastroenterology    Meds ordered this encounter  Medications  . pneumococcal 13-valent conjugate vaccine (PREVNAR 13) SUSP injection    Sig: Inject 0.5 mLs into the muscle once for 1 dose.    Dispense:  0.5 mL    Refill:  0    Order Specific Question:   Supervising Provider    Answer:   Lavera Guise [id number]  . LORazepam (ATIVAN) 0.5 MG tablet    Sig: Take 1/2 to 1 tablet po BID prn agitation.    Dispense:  60 tablet    Refill:  2    Order Specific Question:   Supervising Provider    Answer:   Lavera Guise [id number]    Time spent: [place], MD  Internal Medicine

## 2019-10-24 DIAGNOSIS — J439 Emphysema, unspecified: Secondary | ICD-10-CM | POA: Insufficient documentation

## 2019-10-24 DIAGNOSIS — Z23 Encounter for immunization: Secondary | ICD-10-CM | POA: Insufficient documentation

## 2019-10-24 DIAGNOSIS — Z1211 Encounter for screening for malignant neoplasm of colon: Secondary | ICD-10-CM | POA: Insufficient documentation

## 2019-10-24 DIAGNOSIS — Z79899 Other long term (current) drug therapy: Secondary | ICD-10-CM | POA: Insufficient documentation

## 2019-10-24 DIAGNOSIS — D696 Thrombocytopenia, unspecified: Secondary | ICD-10-CM | POA: Insufficient documentation

## 2019-10-24 DIAGNOSIS — I69359 Hemiplegia and hemiparesis following cerebral infarction affecting unspecified side: Secondary | ICD-10-CM | POA: Insufficient documentation

## 2019-10-27 ENCOUNTER — Telehealth: Payer: Self-pay

## 2019-10-27 NOTE — Telephone Encounter (Signed)
Dr. [name], [facility] sent message from my referral workque a few days ago-don't think you got it. Please advise.  Contacted patient regarding scheduling colonoscopy.  He asked me to contact his sister [name] to schedule.  [name] states that we've tried to do a colonoscopy and her brother is unable to keep the bowel prep down.  Patient is an established patient of Dr. [name].  [name] said that her brother's neurologist or oncologist suggested he could do a colorguard test.  I explained to [name] since there is a family history of colon cancer its strongly recommended we try to do the colonoscopy.  [name] said that this is an error.  Their father did not have colon cancer, it was throat cancer.  [name] and her father both had throat cancer.  I informed her that I would let both PCP and GI docs know, as [name] would like for her brother to have a colorguard test.  I also informed [name] that we do not do Colorguard testing in our office so it would need to be ordered by the PCP-if both doctors are in agreement.  Thanks [employer]

## 2019-10-27 NOTE — Telephone Encounter (Signed)
Will this be ok? Patient told me his father had colon cancer.

## 2019-11-01 ENCOUNTER — Encounter: Payer: Self-pay | Admitting: Nurse Practitioner

## 2019-12-06 ENCOUNTER — Telehealth: Payer: Self-pay

## 2019-12-06 NOTE — Telephone Encounter (Signed)
I have created order to send to [employer] for cologuard. They should be in contact with him in the next week or two.  Thanks, Leretha Pol, fnp-c

## 2019-12-06 NOTE — Telephone Encounter (Signed)
Patients sister [name] has been informed that she can expect a call from [employer] in regards to Colorguard testing for her brother in the next week or two-so when she receives the call it would be for this.  Thanks [employer]

## 2019-12-19 ENCOUNTER — Telehealth: Payer: Self-pay

## 2019-12-19 NOTE — Telephone Encounter (Signed)
Faxed cologuard 

## 2019-12-25 ENCOUNTER — Other Ambulatory Visit: Payer: Self-pay

## 2019-12-25 DIAGNOSIS — F321 Major depressive disorder, single episode, moderate: Secondary | ICD-10-CM

## 2019-12-25 MED ORDER — MIRTAZAPINE 7.5 MG PO TABS: 7.5000 mg | ORAL_TABLET | Freq: Every day | ORAL | 3 refills | Status: DC

## 2020-01-15 ENCOUNTER — Other Ambulatory Visit: Payer: Self-pay | Admitting: Nurse Practitioner

## 2020-01-15 DIAGNOSIS — Z1211 Encounter for screening for malignant neoplasm of colon: Secondary | ICD-10-CM

## 2020-01-15 DIAGNOSIS — Z0001 Encounter for general adult medical examination with abnormal findings: Secondary | ICD-10-CM

## 2020-01-15 DIAGNOSIS — I10 Essential (primary) hypertension: Secondary | ICD-10-CM

## 2020-01-15 DIAGNOSIS — Z125 Encounter for screening for malignant neoplasm of prostate: Secondary | ICD-10-CM

## 2020-01-15 NOTE — Progress Notes (Signed)
Orders for routine, fasting labs and PSA put into Epic and sent through to [facility].

## 2020-01-16 LAB — CBC WITH DIFFERENTIAL/PLATELET
Basophils Absolute: 0 10*3/uL (ref 0.0–0.2)
Basos: 0 %
EOS (ABSOLUTE): 0 10*3/uL (ref 0.0–0.4)
Eos: 0 %
Hematocrit: 42 % (ref 37.5–51.0)
Hemoglobin: 14.6 g/dL (ref 13.0–17.7)
Immature Grans (Abs): 0 10*3/uL (ref 0.0–0.1)
Immature Granulocytes: 0 %
Lymphocytes Absolute: 1.3 10*3/uL (ref 0.7–3.1)
Lymphs: 20 %
MCH: 30 pg (ref 26.6–33.0)
MCHC: 34.8 g/dL (ref 31.5–35.7)
MCV: 86 fL (ref 79–97)
Monocytes Absolute: 0.4 10*3/uL (ref 0.1–0.9)
Monocytes: 6 %
Neutrophils Absolute: 4.8 10*3/uL (ref 1.4–7.0)
Neutrophils: 74 %
Platelets: 176 10*3/uL (ref 150–450)
RBC: 4.86 x10E6/uL (ref 4.14–5.80)
RDW: 12.6 % (ref 11.6–15.4)
WBC: 6.5 10*3/uL (ref 3.4–10.8)

## 2020-01-16 LAB — PSA: Prostate Specific Ag, Serum: 0.5 ng/mL (ref 0.0–4.0)

## 2020-01-16 LAB — COMPREHENSIVE METABOLIC PANEL
ALT: 27 IU/L (ref 0–44)
AST: 19 IU/L (ref 0–40)
Albumin/Globulin Ratio: 1.9 (ref 1.2–2.2)
Albumin: 4.1 g/dL (ref 3.8–4.8)
Alkaline Phosphatase: 146 IU/L — ABNORMAL HIGH (ref 39–117)
BUN/Creatinine Ratio: 6 — ABNORMAL LOW (ref 10–24)
BUN: 7 mg/dL — ABNORMAL LOW (ref 8–27)
Bilirubin Total: 0.2 mg/dL (ref 0.0–1.2)
CO2: 24 mmol/L (ref 20–29)
Calcium: 9.5 mg/dL (ref 8.6–10.2)
Chloride: 105 mmol/L (ref 96–106)
Creatinine, Ser: 1.08 mg/dL (ref 0.76–1.27)
GFR calc Af Amer: 82 mL/min/{1.73_m2} (ref >59)
GFR calc non Af Amer: 71 mL/min/{1.73_m2} (ref >59)
Globulin, Total: 2.2 g/dL (ref 1.5–4.5)
Glucose: 148 mg/dL — ABNORMAL HIGH (ref 65–99)
Potassium: 4.5 mmol/L (ref 3.5–5.2)
Sodium: 142 mmol/L (ref 134–144)
Total Protein: 6.3 g/dL (ref 6.0–8.5)

## 2020-01-16 LAB — LIPID PANEL
Chol/HDL Ratio: 2.4 ratio (ref 0.0–5.0)
Cholesterol, Total: 102 mg/dL (ref 100–199)
HDL: 43 mg/dL (ref >39)
LDL Chol Calc (NIH): 38 mg/dL (ref 0–99)
Triglycerides: 115 mg/dL (ref 0–149)
VLDL Cholesterol Cal: 21 mg/dL (ref 5–40)

## 2020-01-16 LAB — TSH: TSH: 0.334 u[IU]/mL — ABNORMAL LOW (ref 0.450–4.500)

## 2020-01-16 NOTE — Progress Notes (Signed)
Can you see if [facility] can add Free T4 to these labs? Thanks.

## 2020-01-17 ENCOUNTER — Telehealth: Payer: Self-pay

## 2020-01-17 LAB — SPECIMEN STATUS REPORT

## 2020-01-17 LAB — T4, FREE: Free T4: 1.17 ng/dL (ref 0.82–1.77)

## 2020-01-17 NOTE — Progress Notes (Signed)
Overall, looks good. Discuss at visit 1/29

## 2020-01-17 NOTE — Telephone Encounter (Signed)
Confirmed telephone visit with patient.klh 

## 2020-01-19 ENCOUNTER — Encounter: Payer: Self-pay | Admitting: Adult Health

## 2020-01-19 ENCOUNTER — Ambulatory Visit (INDEPENDENT_AMBULATORY_CARE_PROVIDER_SITE_OTHER): Payer: Medicare Other | Admitting: Adult Health

## 2020-01-19 VITALS — Ht 71.0 in | Wt 137.0 lb

## 2020-01-19 DIAGNOSIS — E441 Mild protein-calorie malnutrition: Secondary | ICD-10-CM | POA: Diagnosis not present

## 2020-01-19 DIAGNOSIS — I10 Essential (primary) hypertension: Secondary | ICD-10-CM

## 2020-01-19 DIAGNOSIS — F01518 Vascular dementia, unspecified severity, with other behavioral disturbance: Secondary | ICD-10-CM

## 2020-01-19 DIAGNOSIS — F0151 Vascular dementia with behavioral disturbance: Secondary | ICD-10-CM

## 2020-01-19 DIAGNOSIS — I69359 Hemiplegia and hemiparesis following cerebral infarction affecting unspecified side: Secondary | ICD-10-CM

## 2020-01-19 NOTE — Progress Notes (Signed)
[facility] [place], [place] [address]  Internal MEDICINE  Telephone Visit  Patient Name: Ian Conley  [id number]  TO:[id number]  Date of Service: 01/19/2020  I connected with the patient at 1133 by telephone and verified the patients identity using two identifiers.   I discussed the limitations, risks, security and privacy concerns of performing an evaluation and management service by telephone and the availability of in person appointments. I also discussed with the patient that there may be a patient responsible charge related to the service.  The patient expressed understanding and agrees to proceed.    Chief Complaint  Patient presents with  . Telephone Assessment  . Telephone Screen  . Hypertension    HPI  Pt seen via telephone. Pt reports he is at his baseline.  He reports his blood pressure has been good.  He denies any recent weight loss.  He has some residual left side weakness from a CVA, but he has adapted and is able to walk and use all of his extremities. He denies any recent hospitalizations.  He reports he has been staying in and trying to stay healthy, amid the covid pandemic.  He is sleeping well, and denies any complaints.     Current Medication: Outpatient Encounter Medications as of 01/19/2020  Medication Sig  . amLODipine (NORVASC) 10 MG tablet Take 1 tablet (10 mg total) by mouth daily.  Marland Kitchen atorvastatin (LIPITOR) 80 MG tablet Take 1 tablet (80 mg total) by mouth daily at 6 PM.  . dipyridamole-aspirin (AGGRENOX) 200-25 MG 12hr capsule Take 1 capsule by mouth 2 (two) times daily.  Marland Kitchen donepezil (ARICEPT) 10 MG tablet Take 1 tablet by mouth daily.   . Loratadine (CLARITIN PO) Take 1 tablet by mouth daily.   Marland Kitchen LORazepam (ATIVAN) 0.5 MG tablet Take 1/2 to 1 tablet po BID prn agitation.  . mirtazapine (REMERON) 7.5 MG tablet Take 1 tablet (7.5 mg total) by mouth at bedtime.  . Multiple Vitamins-Minerals (YOUR LIFE MULTI MENS 50+) TABS Take by  mouth.  . pantoprazole (PROTONIX) 20 MG tablet Take 1 tablet (20 mg total) by mouth 2 (two) times daily before a meal.  . polyethylene glycol powder (GLYCOLAX/MIRALAX) powder as needed.    No facility-administered encounter medications on file as of 01/19/2020.    Surgical History: History reviewed. No pertinent surgical history.  Medical History: Past Medical History:  Diagnosis Date  . Allergy   . Anemia   . Hypertension   . Pancreatitis   . Personal history of tobacco use, presenting hazards to health 03/25/2016  . Polycythemia   . Stroke [facility])    2008 9/11    Family History: Family History  Problem Relation Age of Onset  . Diabetes Mother   . Heart failure Mother   . Cancer Father        Throat   . Stroke Father   . Hypertension Father   . HIV Brother   . Thyroid disease Sister        thyroid cancer  . Hypertension Sister        both sisters    Social History   Socioeconomic History  . Marital status: Widowed    Spouse name: Not on file  . Number of children: Not on file  . Years of education: Not on file  . Highest education level: Not on file  Occupational History  . Not on file  Tobacco Use  . Smoking status: Current Every Day Smoker  Packs/day: 1.00    Years: 45.00    Pack years: 45.00    Types: Cigarettes  . Smokeless tobacco: Never Used  Substance and Sexual Activity  . Alcohol use: Not Currently    Comment: when pt was at home, no alcohol since July, 2019  . Drug use: No  . Sexual activity: Not on file  Other Topics Concern  . Not on file  Social History Narrative  . Not on file   Social Determinants of Health   Financial Resource Strain:   . Difficulty of Paying Living Expenses: Not on file  Food Insecurity:   . Worried About Charity fundraiser in the Last Year: Not on file  . Ran Out of Food in the Last Year: Not on file  Transportation Needs:   . Lack of Transportation (Medical): Not on file  . Lack of Transportation  (Non-Medical): Not on file  Physical Activity:   . Days of Exercise per Week: Not on file  . Minutes of Exercise per Session: Not on file  Stress:   . Feeling of Stress : Not on file  Social Connections:   . Frequency of Communication with Friends and Family: Not on file  . Frequency of Social Gatherings with Friends and Family: Not on file  . Attends Religious Services: Not on file  . Active Member of Clubs or Organizations: Not on file  . Attends Archivist Meetings: Not on file  . Marital Status: Not on file  Intimate Partner Violence:   . Fear of Current or Ex-Partner: Not on file  . Emotionally Abused: Not on file  . Physically Abused: Not on file  . Sexually Abused: Not on file      Review of Systems  Constitutional: Negative.  Negative for chills, fatigue and unexpected weight change.  HENT: Negative.  Negative for congestion, rhinorrhea, sneezing and sore throat.   Eyes: Negative for redness.  Respiratory: Negative.  Negative for cough, chest tightness and shortness of breath.   Cardiovascular: Negative.  Negative for chest pain and palpitations.  Gastrointestinal: Negative.  Negative for abdominal pain, constipation, diarrhea, nausea and vomiting.  Endocrine: Negative.   Genitourinary: Negative.  Negative for dysuria and frequency.  Musculoskeletal: Negative.  Negative for arthralgias, back pain, joint swelling and neck pain.  Skin: Negative.  Negative for rash.  Allergic/Immunologic: Negative.   Neurological: Negative.  Negative for tremors and numbness.  Hematological: Negative for adenopathy. Does not bruise/bleed easily.  Psychiatric/Behavioral: Negative.  Negative for behavioral problems, sleep disturbance and suicidal ideas. The patient is not nervous/anxious.     Vital Signs: Ht 5\' 11"  (1.803 m)   Wt 137 lb (62.1 kg)   BMI 19.11 kg/m    Observation/Objective: Well sounding,NAd noted.     Assessment/Plan: 1. Essential hypertension Well  controlled, Continue present management.  2. Mild protein-calorie malnutrition ([place]) Pts weight has stayed stable, continue to encourage caloric intake.    3. Hemiplegia affecting dominant side, post-stroke ([place]) No issues at this time.  Pt has some weakness, but it does not affect everyday activities.   4. Dementia, multiinfarct, with behavioral disturbance ([place]) Continue present medications.  Has sons in the home, and sister who takes care of him.  General Counseling: [place] verbalizes understanding of the findings of today's phone visit and agrees with plan of treatment. I have discussed any further diagnostic evaluation that may be needed or ordered today. We also reviewed his medications today. he has been encouraged to call the  office with any questions or concerns that should arise related to todays visit.    No orders of the defined types were placed in this encounter.   No orders of the defined types were placed in this encounter.   Time spent: [place] AGNP-C Internal medicine

## 2020-01-22 ENCOUNTER — Other Ambulatory Visit: Payer: Self-pay

## 2020-01-22 DIAGNOSIS — I10 Essential (primary) hypertension: Secondary | ICD-10-CM

## 2020-01-22 MED ORDER — AMLODIPINE BESYLATE 10 MG PO TABS: 10.0000 mg | ORAL_TABLET | Freq: Every day | ORAL | 1 refills | Status: DC

## 2020-01-25 ENCOUNTER — Ambulatory Visit: Payer: Medicare Other | Admitting: Nurse Practitioner

## 2020-02-08 NOTE — Progress Notes (Signed)
No new changes noted today. The patient Name and DOB has been verified by phone today by his sister.

## 2020-02-08 NOTE — Progress Notes (Signed)
[facility]  [address], [address] [place], [place] [address] Phone: [phone]  Fax: [phone]   Clinic Day:  02/12/2020  Referring physician: Lavera Guise, MD  Chief Complaint: Ian Conley is a 69 y.o. 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  HPI: The patient was last seen in the hematology clinic on 08/14/2019. At that time, he appeared to be doing well. He denied any complaints. Exam was stable. Hematocrit 41.8, hemoglobin 14.2, platelets 178,000, WBC 5,600. Ferritin was 391. BUN was 6. Smoking cessation was encouraged. Patient did not undergo a phlebotomy.   Low dose chest CT on 08/30/2019 showed lung-RADS 2S, benign appearance or behavior. Recommendation was for continued annual screening with low-dose chest CT without contrast in 12 months. There was aortic atherosclerosis, in addition to left main and right coronary artery disease. There was mild diffuse bronchial wall thickening with moderate centrilobular and paraseptal emphysema; imaging findings suggestive of underlying COPD.  During the interim, he has been doing well. He notes weight loss secondary to decreased appetite over the past year. Oral intake has been affected by dentition. He usually eats breakfast and lunch. His left hand feels "asleep" (old). He notes a pack of cigarettes will last 1.5 days. He denies any chest pain. He is not on any diuretics. I encouraged patient to eat foods high in potassium given his potassium level was 3.3 today. Patient would like samples of Boost and Ensure today. He does not like milk products. He has agreed to see Jennet Maduro, Rd.  Patient has agreed to a colonoscopy. His is followed by Dr. [name]. He denies any black or bloody stool.    Past Medical History:  Diagnosis Date  . Allergy   . Anemia   . Hypertension   . Pancreatitis   . Personal history of tobacco use, presenting hazards to health 03/25/2016  .  Polycythemia   . Stroke [facility])    2008 9/11    History reviewed. No pertinent surgical history.  Family History  Problem Relation Age of Onset  . Diabetes Mother   . Heart failure Mother   . Cancer Father        Throat   . Stroke Father   . Hypertension Father   . HIV Brother   . Thyroid disease Sister        thyroid cancer  . Hypertension Sister        both sisters    Social History:  reports that he has been smoking cigarettes. He has a 45.00 pack-year smoking history. He has never used smokeless tobacco. He reports previous alcohol use. He reports that he does not use drugs. He started smoking between the age of [age]. He smoked 1 pack a day for 48 years. He is smoking 1 pack a day. He drinks 2- 24 ounce beers/day. He was in [employer] for 3 years. He is a Furniture conservator/restorer. 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 He lives in [place]. The patient is accompanied by his sister, Ian Conley, on Ipad today.  Allergies:  Allergies  Allergen Reactions  . Penicillins Rash    Has patient had a PCN reaction causing immediate rash, facial/tongue/throat swelling, SOB or lightheadedness with hypotension: Yes Has patient had a PCN reaction causing severe rash involving mucus membranes or skin necrosis: Unknown Has patient had a PCN reaction that required hospitalization: No Has patient had a PCN reaction occurring within the last 10 years: No If  all of the above answers are "NO", then may proceed with Cephalosporin use.    Current Medications: Current Outpatient Medications  Medication Sig Dispense Refill  . amLODipine (NORVASC) 10 MG tablet Take 1 tablet (10 mg total) by mouth daily. 90 tablet 1  . atorvastatin (LIPITOR) 80 MG tablet Take 1 tablet (80 mg total) by mouth daily at 6 PM. 90 tablet 1  . dipyridamole-aspirin (AGGRENOX) 200-25 MG 12hr capsule Take 1 capsule by mouth 2 (two) times daily. 180 capsule 1  . donepezil (ARICEPT) 10 MG tablet Take 1 tablet by mouth daily.     .  Loratadine (CLARITIN PO) Take 1 tablet by mouth daily.     Marland Kitchen LORazepam (ATIVAN) 0.5 MG tablet Take 1/2 to 1 tablet po BID prn agitation. 60 tablet 2  . mirtazapine (REMERON) 7.5 MG tablet Take 1 tablet (7.5 mg total) by mouth at bedtime. 30 tablet 3  . Multiple Vitamins-Minerals (YOUR LIFE MULTI MENS 50+) TABS Take by mouth.    . pantoprazole (PROTONIX) 20 MG tablet Take 1 tablet (20 mg total) by mouth 2 (two) times daily before a meal. 60 tablet 0  . polyethylene glycol powder (GLYCOLAX/MIRALAX) powder as needed.   0   No current facility-administered medications for this visit.    Review of Systems  Constitutional: Negative for chills, diaphoresis, fever, malaise/fatigue and weight loss (stable).       Feels "good".  HENT: Negative.  Negative for congestion, ear discharge, ear pain, nosebleeds, sinus pain and sore throat.        "My teeth are coming out".  Eyes: Negative.  Negative for blurred vision, double vision, photophobia, pain, discharge and redness.  Respiratory: Negative.  Negative for hemoptysis, sputum production, shortness of breath and wheezing.   Cardiovascular: Negative.  Negative for chest pain, palpitations, orthopnea, leg swelling and PND.  Gastrointestinal: Negative.  Negative for abdominal pain, blood in stool, constipation, diarrhea, heartburn, melena, nausea and vomiting.       Appetite decreased. Oral intake affected by dentition. Does not like milk products.  Genitourinary: Negative.  Negative for dysuria, frequency, hematuria and urgency.  Musculoskeletal: Negative.  Negative for back pain, falls, joint pain, myalgias and neck pain.  Skin: Negative.  Negative for itching.  Neurological: Positive for tingling (left hand tingly at times, feels sleep) and weakness (generalized). Negative for dizziness, tremors, speech change, focal weakness, seizures and headaches.       S/p CVA.  Endo/Heme/Allergies: Negative.  Does not bruise/bleed easily.  Psychiatric/Behavioral:  Positive for memory loss. Negative for depression. The patient is not nervous/anxious and does not have insomnia.   All other systems reviewed and are negative.  Performance status (ECOG): 2  Vitals Blood pressure (!) 116/58, pulse 81, temperature (!) 97 F (36.1 C), resp. rate 18, height 5\' 11"  (1.803 m), weight 138 lb 10.7 oz (62.9 kg), SpO2 99 %.   Physical Exam  Constitutional: He is oriented to person, place, and time. He appears well-developed and well-nourished. No distress.  Elderly gentleman sitting comfortably in exam room in no acute distress.  He has a cane by his side.  HENT:  Head: Normocephalic and atraumatic.  Mouth/Throat: Oropharynx is clear and moist. No oropharyngeal exudate.  Black cap and mask.  Gray hair and beard.  Poor dentition.  Eyes: Pupils are equal, round, and reactive to light. Conjunctivae and EOM are normal. No scleral icterus.  Brown eyes.  Neck: No JVD present.  Cardiovascular: Normal rate, regular rhythm and normal heart  sounds.  No murmur heard. Pulmonary/Chest: Effort normal and breath sounds normal. No respiratory distress. He has no wheezes. He has no rales.  Abdominal: Soft. Bowel sounds are normal. He exhibits no distension and no mass. There is no abdominal tenderness. There is no rebound and no guarding.  Musculoskeletal:        General: No edema. Normal range of motion.     Cervical back: Normal range of motion and neck supple.  Lymphadenopathy:       Head (right side): No preauricular, no posterior auricular and no occipital adenopathy present.       Head (left side): No preauricular, no posterior auricular and no occipital adenopathy present.    He has no cervical adenopathy.    He has no axillary adenopathy.       Right: No inguinal and no supraclavicular adenopathy present.       Left: No inguinal and no supraclavicular adenopathy present.  Neurological: He is alert and oriented to person, place, and time.  Skin: Skin is warm and dry.  No rash noted. He is not diaphoretic. No erythema. No pallor.  Psychiatric: He has a normal mood and affect. His behavior is normal. Judgment and thought content normal.  Nursing note and vitals reviewed.   Appointment on 02/12/2020  Component Date Value Ref Range Status  . WBC 02/12/2020 5.3  4.0 - 10.5 K/uL Final  . RBC 02/12/2020 4.91  4.22 - 5.81 MIL/uL Final  . Hemoglobin 02/12/2020 14.4  13.0 - 17.0 g/dL Final  . HCT 02/12/2020 44.2  39.0 - 52.0 % Final  . MCV 02/12/2020 90.0  80.0 - 100.0 fL Final  . MCH 02/12/2020 29.3  26.0 - 34.0 pg Final  . MCHC 02/12/2020 32.6  30.0 - 36.0 g/dL Final  . RDW 02/12/2020 13.9  11.5 - 15.5 % Final  . Platelets 02/12/2020 195  150 - 400 K/uL Final  . nRBC 02/12/2020 0.0  0.0 - 0.2 % Final  . Neutrophils Relative % 02/12/2020 64  % Final  . Neutro Abs 02/12/2020 3.3  1.7 - 7.7 K/uL Final  . Lymphocytes Relative 02/12/2020 27  % Final  . Lymphs Abs 02/12/2020 1.4  0.7 - 4.0 K/uL Final  . Monocytes Relative 02/12/2020 8  % Final  . Monocytes Absolute 02/12/2020 0.4  0.1 - 1.0 K/uL Final  . Eosinophils Relative 02/12/2020 1  % Final  . Eosinophils Absolute 02/12/2020 0.1  0.0 - 0.5 K/uL Final  . Basophils Relative 02/12/2020 0  % Final  . Basophils Absolute 02/12/2020 0.0  0.0 - 0.1 K/uL Final  . Immature Granulocytes 02/12/2020 0  % Final  . Abs Immature Granulocytes 02/12/2020 0.02  0.00 - 0.07 K/uL Final   Performed at [facility], [address], [place], [place] [address]  . Sodium 02/12/2020 138  135 - 145 mmol/L Final  . Potassium 02/12/2020 3.3* 3.5 - 5.1 mmol/L Final  . Chloride 02/12/2020 103  98 - 111 mmol/L Final  . CO2 02/12/2020 27  22 - 32 mmol/L Final  . Glucose, Bld 02/12/2020 110* 70 - 99 mg/dL Final  . BUN 02/12/2020 7* 8 - 23 mg/dL Final  . Creatinine, Ser 02/12/2020 0.92  0.61 - 1.24 mg/dL Final  . Calcium 02/12/2020 9.1  8.9 - 10.3 mg/dL Final  . Total Protein 02/12/2020 7.3  6.5 - 8.1 g/dL Final  .  Albumin 02/12/2020 4.0  3.5 - 5.0 g/dL Final  . AST 02/12/2020 29  15 -  41 U/L Final  . ALT 02/12/2020 29  0 - 44 U/L Final  . Alkaline Phosphatase 02/12/2020 122  38 - 126 U/L Final  . Total Bilirubin 02/12/2020 0.6  0.3 - 1.2 mg/dL Final  . GFR calc non Af Amer 02/12/2020 >60  >60 mL/min Final  . GFR calc Af Amer 02/12/2020 >60  >60 mL/min Final  . Anion gap 02/12/2020 8  5 - 15 Final   Performed at [facility], [address], [place],  [address]    Assessment:  Ian Conley is a 69 y.o. male with secondary polycythemiafirst noted on 03/10/2016. He has a 48 pack year smokinghistory. He denies any cardiac disease, sleep apnea, or testosterone use. He had a CVAon 09/01/2007 and 07/13/2018.  Work-upon 03/19/2016 revealed a hematocrit of 57.3, hemoglobin 19.5, MCV 91.5, platelets 144,000, and WBC 5000. Erythropoietin level was 3.0 (2.6-18.5). Repeat epo level was 6.8 on 05/05/2016. JAK2 was negative for V617F and exon 12. Ferritin was 181. Iron studies revealed a saturation of 41% and a TIBC of 333.  Low dose spiral chest CTscan on 03/25/2016 was negative. There was notation of a thickened area in the distal stomach or pyloric region.  Chest and abdomen CT on 08/09/2018 revealed moderate to advanced centrilobular and paraseptal emphysema. There was biapical and bibasilar scarring. There was scattered aortic atherosclerosis. No aneurysm. There was no acute cardiopulmonary disease. There was mild fatty infiltration of the liver. There was no gastric abnormality by CT. There was no acute findings in the abdomen.  Low dose chest CT on 08/30/2019 revealed lung-RADS 2S, benign appearance or behavior. There was aortic atherosclerosis, in addition to left main and right coronary artery disease. There was mild diffuse bronchial wall thickening with moderate centrilobular and paraseptal emphysema; imaging findings suggestive of underlying COPD.  He has an  elevated alkaline phosphatase (139). PSA was 0.4 and 0.5 on 01/15/2020. He has never had a colonoscopy.  He undergoes small volume phlebotomy(250-300 cc) if his hematocrit is >52. Last phlebotomy was 08/06/2016.  Symptomatically, he feels "pretty good".  He continues to smoke.  He denies any melena or hematochezia.  Plan: 1.   Labs today: CBC with diff, ferritin, iron studies, sed rate. 2.   Secondary polycythemia, resolved       Hematocrit 44.2. Hemoglobin 14.4.       He continues to smoke 1 pack every day and a half.         Encourage smoking cessation at each visit.       No phlebotomy today.         Discuss concern for normal hematocrit with ongoing smoking.                   Concern for possible subtle blood loss.                   Patient denies any melena, hematochezia or hematuria.                   Ferritin 312 with an iron saturation of 50% and a TIBC 232.                   Patient had originally planned to do Cologuard.   Patient to follow-up with Dr [name].   Urinalysis today to r/o hematuria. 3. Weight loss             He notes ongoing decreased appetite and weight loss.  He notes  issues with his teeth preventing caloric intake.  Ensure samples today. 4.   Elevated iron saturation  Iron saturation 50%.  Ferritin previously 128-306.  Ferritin 1107 on 07/21/2018, 391 on 08/14/2019, and 312 on 02/12/2020.   No intervening phlebotomies.  Suspect elevated ferritin secondary to an acute phase reactant.   However sed rate 7  (normal) on 02/12/2020.  Consider hemochromatosis testing. 5.   Nutrition consult. 6.   RTC in 3 months for weight check. 7.   RTC in 6 months for MD assessment and labs (CBC with diff, ferritin, iron studies, sed rate).  I discussed the assessment and treatment plan with the patient.  The patient was provided an opportunity to ask questions and all were answered.  The patient agreed with the plan and demonstrated an understanding of the  instructions.  The patient was advised to call back if the symptoms worsen or if the condition fails to improve as anticipated.   Lequita Asal, MD, PhD    02/12/2020, 10:58 AM  I, Selena Batten, am acting as scribe for [employer]. Mike Gip, MD, PhD.  I, Alvine Mostafa C. Mike Gip, MD, have reviewed the above documentation for accuracy and completeness, and I agree with the above.

## 2020-02-09 ENCOUNTER — Other Ambulatory Visit: Payer: Self-pay | Admitting: *Deleted

## 2020-02-09 DIAGNOSIS — D751 Secondary polycythemia: Secondary | ICD-10-CM

## 2020-02-12 ENCOUNTER — Inpatient Hospital Stay: Payer: Medicare Other

## 2020-02-12 ENCOUNTER — Other Ambulatory Visit: Payer: Self-pay

## 2020-02-12 ENCOUNTER — Encounter: Payer: Self-pay | Admitting: Hematology and Oncology

## 2020-02-12 ENCOUNTER — Inpatient Hospital Stay: Payer: Medicare Other | Attending: Hematology and Oncology | Admitting: Hematology and Oncology

## 2020-02-12 VITALS — BP 116/58 | HR 81 | Temp 97.0°F | Resp 18 | Ht 71.0 in | Wt 138.7 lb

## 2020-02-12 DIAGNOSIS — Z8673 Personal history of transient ischemic attack (TIA), and cerebral infarction without residual deficits: Secondary | ICD-10-CM | POA: Insufficient documentation

## 2020-02-12 DIAGNOSIS — D751 Secondary polycythemia: Secondary | ICD-10-CM | POA: Diagnosis not present

## 2020-02-12 DIAGNOSIS — Z8249 Family history of ischemic heart disease and other diseases of the circulatory system: Secondary | ICD-10-CM | POA: Insufficient documentation

## 2020-02-12 DIAGNOSIS — Z801 Family history of malignant neoplasm of trachea, bronchus and lung: Secondary | ICD-10-CM | POA: Insufficient documentation

## 2020-02-12 DIAGNOSIS — R634 Abnormal weight loss: Secondary | ICD-10-CM | POA: Diagnosis not present

## 2020-02-12 DIAGNOSIS — I10 Essential (primary) hypertension: Secondary | ICD-10-CM | POA: Insufficient documentation

## 2020-02-12 DIAGNOSIS — Z79899 Other long term (current) drug therapy: Secondary | ICD-10-CM | POA: Insufficient documentation

## 2020-02-12 DIAGNOSIS — Z833 Family history of diabetes mellitus: Secondary | ICD-10-CM | POA: Diagnosis not present

## 2020-02-12 DIAGNOSIS — Z8349 Family history of other endocrine, nutritional and metabolic diseases: Secondary | ICD-10-CM | POA: Diagnosis not present

## 2020-02-12 DIAGNOSIS — R790 Abnormal level of blood mineral: Secondary | ICD-10-CM

## 2020-02-12 DIAGNOSIS — F17210 Nicotine dependence, cigarettes, uncomplicated: Secondary | ICD-10-CM | POA: Insufficient documentation

## 2020-02-12 DIAGNOSIS — Z7982 Long term (current) use of aspirin: Secondary | ICD-10-CM | POA: Insufficient documentation

## 2020-02-12 DIAGNOSIS — D649 Anemia, unspecified: Secondary | ICD-10-CM | POA: Diagnosis not present

## 2020-02-12 LAB — IRON AND TIBC
Iron: 117 ug/dL (ref 45–182)
Saturation Ratios: 50 % — ABNORMAL HIGH (ref 17.9–39.5)
TIBC: 232 ug/dL — ABNORMAL LOW (ref 250–450)
UIBC: 115 ug/dL

## 2020-02-12 LAB — URINALYSIS, COMPLETE (UACMP) WITH MICROSCOPIC
Bacteria, UA: NONE SEEN
Bilirubin Urine: NEGATIVE
Glucose, UA: NEGATIVE mg/dL
Hgb urine dipstick: NEGATIVE
Ketones, ur: NEGATIVE mg/dL
Leukocytes,Ua: NEGATIVE
Nitrite: NEGATIVE
Protein, ur: NEGATIVE mg/dL
RBC / HPF: NONE SEEN RBC/hpf (ref 0–5)
Specific Gravity, Urine: 1.02 (ref 1.005–1.030)
pH: 5.5 (ref 5.0–8.0)

## 2020-02-12 LAB — CBC WITH DIFFERENTIAL/PLATELET
Abs Immature Granulocytes: 0.02 10*3/uL (ref 0.00–0.07)
Basophils Absolute: 0 10*3/uL (ref 0.0–0.1)
Basophils Relative: 0 %
Eosinophils Absolute: 0.1 10*3/uL (ref 0.0–0.5)
Eosinophils Relative: 1 %
HCT: 44.2 % (ref 39.0–52.0)
Hemoglobin: 14.4 g/dL (ref 13.0–17.0)
Immature Granulocytes: 0 %
Lymphocytes Relative: 27 %
Lymphs Abs: 1.4 10*3/uL (ref 0.7–4.0)
MCH: 29.3 pg (ref 26.0–34.0)
MCHC: 32.6 g/dL (ref 30.0–36.0)
MCV: 90 fL (ref 80.0–100.0)
Monocytes Absolute: 0.4 10*3/uL (ref 0.1–1.0)
Monocytes Relative: 8 %
Neutro Abs: 3.3 10*3/uL (ref 1.7–7.7)
Neutrophils Relative %: 64 %
Platelets: 195 10*3/uL (ref 150–400)
RBC: 4.91 MIL/uL (ref 4.22–5.81)
RDW: 13.9 % (ref 11.5–15.5)
WBC: 5.3 10*3/uL (ref 4.0–10.5)
nRBC: 0 % (ref 0.0–0.2)

## 2020-02-12 LAB — COMPREHENSIVE METABOLIC PANEL
ALT: 29 U/L (ref 0–44)
AST: 29 U/L (ref 15–41)
Albumin: 4 g/dL (ref 3.5–5.0)
Alkaline Phosphatase: 122 U/L (ref 38–126)
Anion gap: 8 (ref 5–15)
BUN: 7 mg/dL — ABNORMAL LOW (ref 8–23)
CO2: 27 mmol/L (ref 22–32)
Calcium: 9.1 mg/dL (ref 8.9–10.3)
Chloride: 103 mmol/L (ref 98–111)
Creatinine, Ser: 0.92 mg/dL (ref 0.61–1.24)
GFR calc Af Amer: >60 mL/min (ref >60)
GFR calc non Af Amer: >60 mL/min (ref >60)
Glucose, Bld: 110 mg/dL — ABNORMAL HIGH (ref 70–99)
Potassium: 3.3 mmol/L — ABNORMAL LOW (ref 3.5–5.1)
Sodium: 138 mmol/L (ref 135–145)
Total Bilirubin: 0.6 mg/dL (ref 0.3–1.2)
Total Protein: 7.3 g/dL (ref 6.5–8.1)

## 2020-02-12 LAB — FERRITIN: Ferritin: 312 ng/mL (ref 24–336)

## 2020-02-12 LAB — SEDIMENTATION RATE: Sed Rate: 7 mm/hr (ref 0–20)

## 2020-02-19 ENCOUNTER — Other Ambulatory Visit: Payer: Self-pay

## 2020-02-19 ENCOUNTER — Inpatient Hospital Stay: Payer: Medicare Other | Attending: Hematology and Oncology

## 2020-02-19 DIAGNOSIS — R790 Abnormal level of blood mineral: Secondary | ICD-10-CM

## 2020-02-19 DIAGNOSIS — D751 Secondary polycythemia: Secondary | ICD-10-CM | POA: Diagnosis not present

## 2020-02-21 ENCOUNTER — Other Ambulatory Visit: Payer: Self-pay

## 2020-02-21 DIAGNOSIS — F0151 Vascular dementia with behavioral disturbance: Secondary | ICD-10-CM

## 2020-02-21 DIAGNOSIS — F01518 Vascular dementia, unspecified severity, with other behavioral disturbance: Secondary | ICD-10-CM

## 2020-02-21 MED ORDER — LORAZEPAM 0.5 MG PO TABS: ORAL_TABLET | ORAL | 2 refills | Status: DC

## 2020-02-21 MED ORDER — ATORVASTATIN CALCIUM 80 MG PO TABS: 80.0000 mg | ORAL_TABLET | Freq: Every day | ORAL | 1 refills | Status: DC

## 2020-02-22 ENCOUNTER — Telehealth: Payer: Self-pay

## 2020-02-22 LAB — HEMOCHROMATOSIS DNA-PCR(C282Y,H63D)

## 2020-02-22 NOTE — Telephone Encounter (Signed)
Nutrition  Received call from sister, [name] requesting to cancel nutrition appointment on 3/8. Let [name] know that RD would cancel appointment and RD is available if needed in the future. Scientist, water quality.  Bubba Vanbenschoten B. Zenia Resides, [place], [place] Registered Dietitian [phone] (pager)

## 2020-02-26 ENCOUNTER — Inpatient Hospital Stay: Payer: Medicare Other

## 2020-03-07 ENCOUNTER — Other Ambulatory Visit: Payer: Self-pay

## 2020-03-07 MED ORDER — ASPIRIN-DIPYRIDAMOLE ER 25-200 MG PO CP12: 1.0000 | ORAL_CAPSULE | Freq: Two times a day (BID) | ORAL | 1 refills | Status: DC

## 2020-04-16 ENCOUNTER — Telehealth: Payer: Self-pay

## 2020-04-16 NOTE — Telephone Encounter (Signed)
Confirmed appointment on 04/18/2020 and screened for covid. klh

## 2020-04-18 ENCOUNTER — Ambulatory Visit (INDEPENDENT_AMBULATORY_CARE_PROVIDER_SITE_OTHER): Payer: Medicare Other | Admitting: Adult Health

## 2020-04-18 ENCOUNTER — Other Ambulatory Visit: Payer: Self-pay

## 2020-04-18 ENCOUNTER — Encounter: Payer: Self-pay | Admitting: Adult Health

## 2020-04-18 VITALS — BP 114/57 | HR 82 | Temp 97.6°F | Resp 16 | Ht 71.0 in | Wt 133.4 lb

## 2020-04-18 DIAGNOSIS — I10 Essential (primary) hypertension: Secondary | ICD-10-CM | POA: Diagnosis not present

## 2020-04-18 DIAGNOSIS — D751 Secondary polycythemia: Secondary | ICD-10-CM

## 2020-04-18 DIAGNOSIS — F01518 Vascular dementia, unspecified severity, with other behavioral disturbance: Secondary | ICD-10-CM

## 2020-04-18 DIAGNOSIS — I69359 Hemiplegia and hemiparesis following cerebral infarction affecting unspecified side: Secondary | ICD-10-CM | POA: Diagnosis not present

## 2020-04-18 DIAGNOSIS — F0151 Vascular dementia with behavioral disturbance: Secondary | ICD-10-CM

## 2020-04-18 DIAGNOSIS — E441 Mild protein-calorie malnutrition: Secondary | ICD-10-CM | POA: Diagnosis not present

## 2020-04-18 NOTE — Progress Notes (Signed)
[facility] [place], [place] [address]  Internal MEDICINE  Office Visit Note  Patient Name: Ian Conley  [id number]  TO:[id number]  Date of Service: 04/18/2020  Chief Complaint  Patient presents with  . Follow-up  . Hypertension    HPI  Pt is here for follow up on HTN, and dementia.  His blood pressure is 114/57 today.  Denies Chest pain, Shortness of breath, palpitations, headache, or blurred vision.  His joined in exam room by family member.  His memory is at baseline, and he continues to take Aricept.  His weakness from his CVA has worsened slightly and he is using a cane to get around.    Current Medication: Outpatient Encounter Medications as of 04/18/2020  Medication Sig  . amLODipine (NORVASC) 10 MG tablet Take 1 tablet (10 mg total) by mouth daily.  Marland Kitchen atorvastatin (LIPITOR) 80 MG tablet Take 1 tablet (80 mg total) by mouth daily at 6 PM.  . dipyridamole-aspirin (AGGRENOX) 200-25 MG 12hr capsule Take 1 capsule by mouth 2 (two) times daily.  Marland Kitchen donepezil (ARICEPT) 10 MG tablet Take 1 tablet by mouth daily.   . Loratadine (CLARITIN PO) Take 1 tablet by mouth daily.   Marland Kitchen LORazepam (ATIVAN) 0.5 MG tablet Take 1/2 to 1 tablet po BID prn agitation.  . mirtazapine (REMERON) 7.5 MG tablet Take 1 tablet (7.5 mg total) by mouth at bedtime.  . Multiple Vitamins-Minerals (YOUR LIFE MULTI MENS 50+) TABS Take by mouth.  . pantoprazole (PROTONIX) 20 MG tablet Take 1 tablet (20 mg total) by mouth 2 (two) times daily before a meal.  . polyethylene glycol powder (GLYCOLAX/MIRALAX) powder as needed.    No facility-administered encounter medications on file as of 04/18/2020.    Surgical History: History reviewed. No pertinent surgical history.  Medical History: Past Medical History:  Diagnosis Date  . Allergy   . Anemia   . Hypertension   . Pancreatitis   . Personal history of tobacco use, presenting hazards to health 03/25/2016  . Polycythemia   . Stroke  [facility])    2008 9/11    Family History: Family History  Problem Relation Age of Onset  . Diabetes Mother   . Heart failure Mother   . Cancer Father        Throat   . Stroke Father   . Hypertension Father   . HIV Brother   . Thyroid disease Sister        thyroid cancer  . Hypertension Sister        both sisters    Social History   Socioeconomic History  . Marital status: Widowed    Spouse name: Not on file  . Number of children: Not on file  . Years of education: Not on file  . Highest education level: Not on file  Occupational History  . Not on file  Tobacco Use  . Smoking status: Current Every Day Smoker    Packs/day: 1.00    Years: 45.00    Pack years: 45.00    Types: Cigarettes  . Smokeless tobacco: Never Used  Substance and Sexual Activity  . Alcohol use: Not Currently    Comment: when pt was at home, no alcohol since July, 2019  . Drug use: No  . Sexual activity: Not on file  Other Topics Concern  . Not on file  Social History Narrative  . Not on file   Social Determinants of Health   Financial Resource Strain:   .  Difficulty of Paying Living Expenses:   Food Insecurity:   . Worried About Charity fundraiser in the Last Year:   . Arboriculturist in the Last Year:   Transportation Needs:   . Film/video editor (Medical):   Marland Kitchen Lack of Transportation (Non-Medical):   Physical Activity:   . Days of Exercise per Week:   . Minutes of Exercise per Session:   Stress:   . Feeling of Stress :   Social Connections:   . Frequency of Communication with Friends and Family:   . Frequency of Social Gatherings with Friends and Family:   . Attends Religious Services:   . Active Member of Clubs or Organizations:   . Attends Archivist Meetings:   Marland Kitchen Marital Status:   Intimate Partner Violence:   . Fear of Current or Ex-Partner:   . Emotionally Abused:   Marland Kitchen Physically Abused:   . Sexually Abused:       Review of Systems  Constitutional:  Negative.  Negative for chills, fatigue and unexpected weight change.  HENT: Negative.  Negative for congestion, rhinorrhea, sneezing and sore throat.   Eyes: Negative for redness.  Respiratory: Negative.  Negative for cough, chest tightness and shortness of breath.   Cardiovascular: Negative.  Negative for chest pain and palpitations.  Gastrointestinal: Negative.  Negative for abdominal pain, constipation, diarrhea, nausea and vomiting.  Endocrine: Negative.   Genitourinary: Negative.  Negative for dysuria and frequency.  Musculoskeletal: Negative.  Negative for arthralgias, back pain, joint swelling and neck pain.  Skin: Negative.  Negative for rash.  Allergic/Immunologic: Negative.   Neurological: Negative.  Negative for tremors and numbness.  Hematological: Negative for adenopathy. Does not bruise/bleed easily.  Psychiatric/Behavioral: Negative.  Negative for behavioral problems, sleep disturbance and suicidal ideas. The patient is not nervous/anxious.     Vital Signs: BP (!) 114/57   Pulse 82   Temp 97.6 F (36.4 C)   Resp 16   Ht 5\' 11"  (1.803 m)   Wt 133 lb 6.4 oz (60.5 kg)   SpO2 93%   BMI 18.61 kg/m    Physical Exam Vitals and nursing note reviewed.  Constitutional:      General: He is not in acute distress.    Appearance: He is well-developed. He is not diaphoretic.  HENT:     Head: Normocephalic and atraumatic.     Mouth/Throat:     Pharynx: No oropharyngeal exudate.  Eyes:     Pupils: Pupils are equal, round, and reactive to light.  Neck:     Thyroid: No thyromegaly.     Vascular: No JVD.     Trachea: No tracheal deviation.  Cardiovascular:     Rate and Rhythm: Normal rate and regular rhythm.     Heart sounds: Normal heart sounds. No murmur. No friction rub. No gallop.   Pulmonary:     Effort: Pulmonary effort is normal. No respiratory distress.     Breath sounds: Normal breath sounds. No wheezing or rales.  Chest:     Chest wall: No tenderness.   Abdominal:     Palpations: Abdomen is soft.     Tenderness: There is no abdominal tenderness. There is no guarding.  Musculoskeletal:        General: Normal range of motion.     Cervical back: Normal range of motion and neck supple.  Lymphadenopathy:     Cervical: No cervical adenopathy.  Skin:    General: Skin is warm and dry.  Neurological:     Mental Status: He is alert and oriented to person, place, and time.     Cranial Nerves: No cranial nerve deficit.  Psychiatric:        Behavior: Behavior normal.        Thought Content: Thought content normal.        Judgment: Judgment normal.    Assessment/Plan: 1. Essential hypertension BP today 114/57.  Continue with current mgmt.   2. Polycythemia Continue to follow up with Dr. Mike Gip  3. Dementia, multiinfarct, with behavioral disturbance ([place]) Answering questions appropriately.  No behavioral issues currently.   4. Hemiplegia affecting dominant side, post-stroke ([facility]) Using cane, no new weakness.   5. Mild protein-calorie malnutrition ([place]) Given Ensure case sample.    General Counseling: Johngabriel verbalizes understanding of the findings of todays visit and agrees with plan of treatment. I have discussed any further diagnostic evaluation that may be needed or ordered today. We also reviewed his medications today. he has been encouraged to call the office with any questions or concerns that should arise related to todays visit.    No orders of the defined types were placed in this encounter.   No orders of the defined types were placed in this encounter.   Time spent: 30 Minutes   This patient was seen by Orson Gear AGNP-C in Collaboration with Dr Lavera Guise as a part of collaborative care agreement     Kendell Bane AGNP-C Internal medicine

## 2020-04-22 ENCOUNTER — Other Ambulatory Visit: Payer: Self-pay

## 2020-04-22 DIAGNOSIS — F321 Major depressive disorder, single episode, moderate: Secondary | ICD-10-CM

## 2020-04-22 MED ORDER — MIRTAZAPINE 7.5 MG PO TABS: 7.5000 mg | ORAL_TABLET | Freq: Every day | ORAL | 3 refills | Status: DC

## 2020-05-13 ENCOUNTER — Other Ambulatory Visit: Payer: Self-pay

## 2020-05-13 ENCOUNTER — Inpatient Hospital Stay: Payer: Medicare Other | Attending: Hematology and Oncology

## 2020-05-13 ENCOUNTER — Telehealth: Payer: Self-pay

## 2020-05-13 NOTE — Telephone Encounter (Signed)
Weight Check for today: 59.90 KG / 131.8 lbs

## 2020-05-15 DIAGNOSIS — M4802 Spinal stenosis, cervical region: Secondary | ICD-10-CM | POA: Diagnosis not present

## 2020-05-15 DIAGNOSIS — R413 Other amnesia: Secondary | ICD-10-CM | POA: Diagnosis not present

## 2020-05-16 ENCOUNTER — Emergency Department: Payer: Medicare Other

## 2020-05-16 ENCOUNTER — Other Ambulatory Visit: Payer: Self-pay

## 2020-05-16 ENCOUNTER — Encounter: Payer: Self-pay | Admitting: Emergency Medicine

## 2020-05-16 ENCOUNTER — Emergency Department
Admission: EM | Admit: 2020-05-16 | Discharge: 2020-05-16 | Disposition: A | Discharge status: Home / Self Care | Payer: Medicare Other | Attending: Emergency Medicine | Admitting: Emergency Medicine

## 2020-05-16 DIAGNOSIS — F17210 Nicotine dependence, cigarettes, uncomplicated: Secondary | ICD-10-CM | POA: Insufficient documentation

## 2020-05-16 DIAGNOSIS — I10 Essential (primary) hypertension: Secondary | ICD-10-CM | POA: Insufficient documentation

## 2020-05-16 DIAGNOSIS — M25511 Pain in right shoulder: Secondary | ICD-10-CM | POA: Diagnosis not present

## 2020-05-16 DIAGNOSIS — F0151 Vascular dementia with behavioral disturbance: Secondary | ICD-10-CM | POA: Diagnosis not present

## 2020-05-16 DIAGNOSIS — Z79899 Other long term (current) drug therapy: Secondary | ICD-10-CM | POA: Insufficient documentation

## 2020-05-16 DIAGNOSIS — S299XXA Unspecified injury of thorax, initial encounter: Secondary | ICD-10-CM | POA: Diagnosis not present

## 2020-05-16 DIAGNOSIS — M7918 Myalgia, other site: Secondary | ICD-10-CM | POA: Insufficient documentation

## 2020-05-16 DIAGNOSIS — I69351 Hemiplegia and hemiparesis following cerebral infarction affecting right dominant side: Secondary | ICD-10-CM | POA: Diagnosis not present

## 2020-05-16 DIAGNOSIS — M25562 Pain in left knee: Secondary | ICD-10-CM | POA: Diagnosis not present

## 2020-05-16 MED ORDER — IBUPROFEN 600 MG PO TABS
600.0000 mg | ORAL_TABLET | Freq: Once | ORAL | Status: AC
  Administered 2020-05-16: 600 mg via ORAL
  Filled 2020-05-16: qty 1

## 2020-05-16 MED ORDER — IBUPROFEN 600 MG PO TABS: 600.0000 mg | ORAL_TABLET | Freq: Three times a day (TID) | ORAL | 0 refills | Status: DC | PRN

## 2020-05-16 MED ORDER — TRAMADOL HCL 50 MG PO TABS
50.0000 mg | ORAL_TABLET | Freq: Once | ORAL | Status: AC
  Administered 2020-05-16: 50 mg via ORAL
  Filled 2020-05-16: qty 1

## 2020-05-16 MED ORDER — TRAMADOL HCL 50 MG PO TABS: 50.0000 mg | ORAL_TABLET | Freq: Two times a day (BID) | ORAL | 0 refills | Status: DC | PRN

## 2020-05-16 NOTE — Discharge Instructions (Signed)
X-rays of the right shoulder, left ribs, and left knee shows no acute abnormalities.  Follow discharge care instruction take medication as directed.  Do not drive while taking tramadol.
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
## 2020-05-22 ENCOUNTER — Telehealth: Payer: Self-pay
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
## 2020-06-05 ENCOUNTER — Other Ambulatory Visit: Payer: Self-pay

## 2020-06-05 DIAGNOSIS — F01518 Vascular dementia, unspecified severity, with other behavioral disturbance: Secondary | ICD-10-CM

## 2020-06-07 MED ORDER — LORAZEPAM 0.5 MG PO TABS: ORAL_TABLET | ORAL | 1 refills | Status: DC

## 2020-07-15 ENCOUNTER — Telehealth: Payer: Self-pay

## 2020-07-15 NOTE — Telephone Encounter (Signed)
Tried contacting patient to confirm appointment on 07/17/2020 phone # not in service. klh

## 2020-07-16 ENCOUNTER — Telehealth: Payer: Self-pay

## 2020-07-16 NOTE — Telephone Encounter (Signed)
Confirmed appointment on 07/17/2020 and screened for covid. klh 

## 2020-07-17 ENCOUNTER — Encounter: Payer: Self-pay | Admitting: Adult Health

## 2020-07-17 ENCOUNTER — Ambulatory Visit (INDEPENDENT_AMBULATORY_CARE_PROVIDER_SITE_OTHER): Payer: Medicare Other | Admitting: Adult Health

## 2020-07-17 ENCOUNTER — Other Ambulatory Visit: Payer: Self-pay

## 2020-07-17 VITALS — BP 114/56 | HR 77 | Temp 97.3°F | Resp 16 | Ht 70.5 in | Wt 131.8 lb

## 2020-07-17 DIAGNOSIS — F0151 Vascular dementia with behavioral disturbance: Secondary | ICD-10-CM

## 2020-07-17 DIAGNOSIS — I69359 Hemiplegia and hemiparesis following cerebral infarction affecting unspecified side: Secondary | ICD-10-CM | POA: Diagnosis not present

## 2020-07-17 DIAGNOSIS — I10 Essential (primary) hypertension: Secondary | ICD-10-CM | POA: Diagnosis not present

## 2020-07-17 DIAGNOSIS — D751 Secondary polycythemia: Secondary | ICD-10-CM

## 2020-07-17 DIAGNOSIS — F01518 Vascular dementia, unspecified severity, with other behavioral disturbance: Secondary | ICD-10-CM

## 2020-07-17 MED ORDER — LORAZEPAM 0.5 MG PO TABS: ORAL_TABLET | ORAL | 1 refills | Status: DC

## 2020-07-17 NOTE — Progress Notes (Signed)
[facility] [place], [place] [address]  Internal MEDICINE  Office Visit Note  Patient Name: Ian Conley  [id number]  [id number]  Date of Service: 07/17/2020  Chief Complaint  Patient presents with  . Hypertension    3 month f-up    HPI  PT is here for follow up on HTN and Dementia.  His bp today is well controlled 114/56, he Denies Chest pain, Shortness of breath, palpitations, headache, or blurred vision.  His sister is in exam room today.  He remains on Aricept.  He continues to use cane for ambulation.   License is revoked, he is refusing to surrender.  Discussed this today.  Current Medication: Outpatient Encounter Medications as of 07/17/2020  Medication Sig  . amLODipine (NORVASC) 10 MG tablet Take 1 tablet (10 mg total) by mouth daily.  Marland Kitchen atorvastatin (LIPITOR) 80 MG tablet Take 1 tablet (80 mg total) by mouth daily at 6 PM.  . dipyridamole-aspirin (AGGRENOX) 200-25 MG 12hr capsule Take 1 capsule by mouth 2 (two) times daily.  Marland Kitchen donepezil (ARICEPT) 10 MG tablet Take 1 tablet by mouth daily.   Marland Kitchen ibuprofen (ADVIL) 600 MG tablet Take 1 tablet (600 mg total) by mouth every 8 (eight) hours as needed.  . Loratadine (CLARITIN PO) Take 1 tablet by mouth daily.   Marland Kitchen LORazepam (ATIVAN) 0.5 MG tablet Take 1/2 to 1 tablet po BID prn agitation.  . mirtazapine (REMERON) 7.5 MG tablet Take 1 tablet (7.5 mg total) by mouth at bedtime.  . Multiple Vitamins-Minerals (YOUR LIFE MULTI MENS 50+) TABS Take by mouth.  . pantoprazole (PROTONIX) 20 MG tablet Take 1 tablet (20 mg total) by mouth 2 (two) times daily before a meal.  . polyethylene glycol powder (GLYCOLAX/MIRALAX) powder as needed.   . traMADol (ULTRAM) 50 MG tablet Take 1 tablet (50 mg total) by mouth every 12 (twelve) hours as needed.   No facility-administered encounter medications on file as of 07/17/2020.    Surgical History: History reviewed. No pertinent surgical history.  Medical History: Past  Medical History:  Diagnosis Date  . Allergy   . Anemia   . Hypertension   . Pancreatitis   . Personal history of tobacco use, presenting hazards to health 03/25/2016  . Polycythemia   . Stroke [facility])    2008 9/11    Family History: Family History  Problem Relation Age of Onset  . Diabetes Mother   . Heart failure Mother   . Cancer Father        Throat   . Stroke Father   . Hypertension Father   . HIV Brother   . Thyroid disease Sister        thyroid cancer  . Hypertension Sister        both sisters    Social History   Socioeconomic History  . Marital status: Widowed    Spouse name: Not on file  . Number of children: Not on file  . Years of education: Not on file  . Highest education level: Not on file  Occupational History  . Not on file  Tobacco Use  . Smoking status: Current Every Day Smoker    Packs/day: 1.00    Years: 45.00    Pack years: 45.00    Types: Cigarettes  . Smokeless tobacco: Never Used  Vaping Use  . Vaping Use: Never used  Substance and Sexual Activity  . Alcohol use: Not Currently    Comment: when pt was at  home, no alcohol since July, 2019  . Drug use: No  . Sexual activity: Not on file  Other Topics Concern  . Not on file  Social History Narrative  . Not on file   Social Determinants of Health   Financial Resource Strain:   . Difficulty of Paying Living Expenses:   Food Insecurity:   . Worried About Charity fundraiser in the Last Year:   . Arboriculturist in the Last Year:   Transportation Needs:   . Film/video editor (Medical):   Marland Kitchen Lack of Transportation (Non-Medical):   Physical Activity:   . Days of Exercise per Week:   . Minutes of Exercise per Session:   Stress:   . Feeling of Stress :   Social Connections:   . Frequency of Communication with Friends and Family:   . Frequency of Social Gatherings with Friends and Family:   . Attends Religious Services:   . Active Member of Clubs or Organizations:   . Attends English as a second language teacher Meetings:   Marland Kitchen Marital Status:   Intimate Partner Violence:   . Fear of Current or Ex-Partner:   . Emotionally Abused:   Marland Kitchen Physically Abused:   . Sexually Abused:       Review of Systems  Constitutional: Negative.  Negative for chills, fatigue and unexpected weight change.  HENT: Negative.  Negative for congestion, rhinorrhea, sneezing and sore throat.   Eyes: Negative for redness.  Respiratory: Negative.  Negative for cough, chest tightness and shortness of breath.   Cardiovascular: Negative.  Negative for chest pain and palpitations.  Gastrointestinal: Negative.  Negative for abdominal pain, constipation, diarrhea, nausea and vomiting.  Endocrine: Negative.   Genitourinary: Negative.  Negative for dysuria and frequency.  Musculoskeletal: Negative.  Negative for arthralgias, back pain, joint swelling and neck pain.  Skin: Negative.  Negative for rash.  Allergic/Immunologic: Negative.   Neurological: Negative.  Negative for tremors and numbness.  Hematological: Negative for adenopathy. Does not bruise/bleed easily.  Psychiatric/Behavioral: Negative.  Negative for behavioral problems, sleep disturbance and suicidal ideas. The patient is not nervous/anxious.     Vital Signs: BP (!) 114/56   Pulse 77   Temp (!) 97.3 F (36.3 C)   Resp 16   Ht 5' 10.5" (1.791 m)   Wt 131 lb 12.8 oz (59.8 kg)   SpO2 96%   BMI 18.64 kg/m    Physical Exam Vitals and nursing note reviewed.  Constitutional:      General: He is not in acute distress.    Appearance: He is well-developed. He is not diaphoretic.  HENT:     Head: Normocephalic and atraumatic.     Mouth/Throat:     Pharynx: No oropharyngeal exudate.  Eyes:     Pupils: Pupils are equal, round, and reactive to light.  Neck:     Thyroid: No thyromegaly.     Vascular: No JVD.     Trachea: No tracheal deviation.  Cardiovascular:     Rate and Rhythm: Normal rate and regular rhythm.     Heart sounds: Normal heart  sounds. No murmur heard.  No friction rub. No gallop.   Pulmonary:     Effort: Pulmonary effort is normal. No respiratory distress.     Breath sounds: Normal breath sounds. No wheezing or rales.  Chest:     Chest wall: No tenderness.  Abdominal:     Palpations: Abdomen is soft.     Tenderness: There is no abdominal  tenderness. There is no guarding.  Musculoskeletal:        General: Normal range of motion.     Cervical back: Normal range of motion and neck supple.  Lymphadenopathy:     Cervical: No cervical adenopathy.  Skin:    General: Skin is warm and dry.  Neurological:     Mental Status: He is alert and oriented to person, place, and time.     Cranial Nerves: No cranial nerve deficit.  Psychiatric:        Behavior: Behavior normal.        Thought Content: Thought content normal.        Judgment: Judgment normal.     Assessment/Plan: 1. Essential hypertension Controlled. Continue to monitor.   2. Dementia, multiinfarct, with behavioral disturbance ([place]) Discussed license revocation with patient, and he verbalized understanding that he has to turn in his drivers license by law, and that he should no longer be driving. HE verbalized understanding. Agrees to comply. Daughter encouraged to contact police if patient does not comply.   Reviewed risks and possible side effects associated with taking opiates, benzodiazepines and other CNS depressants. Combination of these could cause dizziness and drowsiness. Advised patient not to drive or operate machinery when taking these medications, as patient's and other's life can be at risk and will have consequences. Patient verbalized understanding in this matter. Dependence and abuse for these drugs will be monitored closely. A Controlled substance policy and procedure is on file which allows [facility] to order a urine drug screen test at any visit. Patient understands and agrees with the plan - LORazepam (ATIVAN) 0.5 MG tablet;  Take 1/2 to 1 tablet po BID prn agitation.  Dispense: 60 tablet; Refill: 1  3. Polycythemia Continue to follow with oncology.   4. Hemiplegia affecting dominant side, post-stroke ([place]) Remains slightly weakened on dominate side, is able to move extremities purposefully, good grip strengths.    General Counseling: Muhamed verbalizes understanding of the findings of todays visit and agrees with plan of treatment. I have discussed any further diagnostic evaluation that may be needed or ordered today. We also reviewed his medications today. he has been encouraged to call the office with any questions or concerns that should arise related to todays visit.    No orders of the defined types were placed in this encounter.   No orders of the defined types were placed in this encounter.   Time spent: 30 Minutes   This patient was seen by Orson Gear AGNP-C in Collaboration with Dr Lavera Guise as a part of collaborative care agreement     Kendell Bane AGNP-C Internal medicine

## 2020-07-22 ENCOUNTER — Other Ambulatory Visit: Payer: Self-pay

## 2020-07-22 DIAGNOSIS — I10 Essential (primary) hypertension: Secondary | ICD-10-CM

## 2020-07-22 MED ORDER — AMLODIPINE BESYLATE 10 MG PO TABS: 10.0000 mg | ORAL_TABLET | Freq: Every day | ORAL | 1 refills | Status: DC

## 2020-07-31 ENCOUNTER — Telehealth: Payer: Self-pay | Admitting: *Deleted

## 2020-07-31 ENCOUNTER — Other Ambulatory Visit: Payer: Self-pay | Admitting: *Deleted

## 2020-07-31 DIAGNOSIS — Z87891 Personal history of nicotine dependence: Secondary | ICD-10-CM

## 2020-07-31 DIAGNOSIS — Z122 Encounter for screening for malignant neoplasm of respiratory organs: Secondary | ICD-10-CM

## 2020-07-31 NOTE — Telephone Encounter (Signed)
(  07/31/2020) Pt notified that lung cancer screening imaging is due currently or in the near future. Verified smoking history (Current Smoker,increased from 1 to 3 ppd ). Tentative appt for 09/04/20 @ 11:45 SRW
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
## 2020-08-12 ENCOUNTER — Inpatient Hospital Stay: Payer: Medicare Other | Attending: Hematology and Oncology

## 2020-08-12 ENCOUNTER — Other Ambulatory Visit: Payer: Self-pay

## 2020-08-12 ENCOUNTER — Ambulatory Visit: Payer: Medicare Other | Admitting: Hematology and Oncology

## 2020-08-12 ENCOUNTER — Encounter: Payer: Self-pay | Admitting: Hematology and Oncology

## 2020-08-12 ENCOUNTER — Other Ambulatory Visit: Payer: Medicare Other

## 2020-08-12 ENCOUNTER — Inpatient Hospital Stay (HOSPITAL_BASED_OUTPATIENT_CLINIC_OR_DEPARTMENT_OTHER): Payer: Medicare Other | Admitting: Hematology and Oncology

## 2020-08-12 VITALS — BP 115/57 | HR 72 | Temp 98.4°F | Wt 137.2 lb

## 2020-08-12 DIAGNOSIS — R634 Abnormal weight loss: Secondary | ICD-10-CM

## 2020-08-12 DIAGNOSIS — Z122 Encounter for screening for malignant neoplasm of respiratory organs: Secondary | ICD-10-CM

## 2020-08-12 DIAGNOSIS — F17210 Nicotine dependence, cigarettes, uncomplicated: Secondary | ICD-10-CM | POA: Diagnosis not present

## 2020-08-12 DIAGNOSIS — D751 Secondary polycythemia: Secondary | ICD-10-CM | POA: Insufficient documentation

## 2020-08-12 DIAGNOSIS — Z8673 Personal history of transient ischemic attack (TIA), and cerebral infarction without residual deficits: Secondary | ICD-10-CM | POA: Diagnosis not present

## 2020-08-12 DIAGNOSIS — R790 Abnormal level of blood mineral: Secondary | ICD-10-CM | POA: Insufficient documentation

## 2020-08-12 LAB — CBC WITH DIFFERENTIAL/PLATELET
Abs Immature Granulocytes: 0.01 10*3/uL (ref 0.00–0.07)
Basophils Absolute: 0 10*3/uL (ref 0.0–0.1)
Basophils Relative: 0 %
Eosinophils Absolute: 0.1 10*3/uL (ref 0.0–0.5)
Eosinophils Relative: 2 %
HCT: 40.6 % (ref 39.0–52.0)
Hemoglobin: 13.4 g/dL (ref 13.0–17.0)
Immature Granulocytes: 0 %
Lymphocytes Relative: 39 %
Lymphs Abs: 2.5 10*3/uL (ref 0.7–4.0)
MCH: 29.7 pg (ref 26.0–34.0)
MCHC: 33 g/dL (ref 30.0–36.0)
MCV: 90 fL (ref 80.0–100.0)
Monocytes Absolute: 0.5 10*3/uL (ref 0.1–1.0)
Monocytes Relative: 9 %
Neutro Abs: 3.2 10*3/uL (ref 1.7–7.7)
Neutrophils Relative %: 50 %
Platelets: 167 10*3/uL (ref 150–400)
RBC: 4.51 MIL/uL (ref 4.22–5.81)
RDW: 14.6 % (ref 11.5–15.5)
WBC: 6.3 10*3/uL (ref 4.0–10.5)
nRBC: 0 % (ref 0.0–0.2)

## 2020-08-12 LAB — IRON AND TIBC
Iron: 82 ug/dL (ref 45–182)
Saturation Ratios: 39 % (ref 17.9–39.5)
TIBC: 211 ug/dL — ABNORMAL LOW (ref 250–450)
UIBC: 129 ug/dL

## 2020-08-12 LAB — FERRITIN: Ferritin: 265 ng/mL (ref 24–336)

## 2020-08-12 LAB — SEDIMENTATION RATE: Sed Rate: 7 mm/hr (ref 0–20)

## 2020-08-19 ENCOUNTER — Other Ambulatory Visit: Payer: Self-pay | Admitting: Adult Health

## 2020-08-19 DIAGNOSIS — F321 Major depressive disorder, single episode, moderate: Secondary | ICD-10-CM

## 2020-08-30 ENCOUNTER — Other Ambulatory Visit: Payer: Self-pay

## 2020-08-30 ENCOUNTER — Other Ambulatory Visit: Payer: Self-pay | Admitting: Adult Health

## 2020-08-30 DIAGNOSIS — F01518 Vascular dementia, unspecified severity, with other behavioral disturbance: Secondary | ICD-10-CM

## 2020-08-30 MED ORDER — LORAZEPAM 0.5 MG PO TABS: ORAL_TABLET | ORAL | 1 refills | Status: DC

## 2020-08-30 MED ORDER — ASPIRIN-DIPYRIDAMOLE ER 25-200 MG PO CP12: 1.0000 | ORAL_CAPSULE | Freq: Two times a day (BID) | ORAL | 1 refills | Status: DC

## 2020-09-04 ENCOUNTER — Other Ambulatory Visit: Payer: Self-pay

## 2020-09-04 ENCOUNTER — Ambulatory Visit
Admission: RE | Admit: 2020-09-04 | Discharge: 2020-09-04 | Disposition: A | Discharge status: Home / Self Care | Payer: Medicare Other | Source: Ambulatory Visit | Attending: Nurse Practitioner | Admitting: Nurse Practitioner

## 2020-09-04 DIAGNOSIS — Z87891 Personal history of nicotine dependence: Secondary | ICD-10-CM

## 2020-09-04 DIAGNOSIS — Z122 Encounter for screening for malignant neoplasm of respiratory organs: Secondary | ICD-10-CM

## 2020-09-04 DIAGNOSIS — F17210 Nicotine dependence, cigarettes, uncomplicated: Secondary | ICD-10-CM | POA: Diagnosis not present

## 2020-09-09 ENCOUNTER — Encounter: Payer: Self-pay | Admitting: *Deleted

## 2020-10-28 ENCOUNTER — Encounter: Payer: Self-pay | Admitting: Nurse Practitioner

## 2020-10-28 ENCOUNTER — Other Ambulatory Visit: Payer: Self-pay

## 2020-10-28 ENCOUNTER — Ambulatory Visit (INDEPENDENT_AMBULATORY_CARE_PROVIDER_SITE_OTHER): Payer: Medicare Other | Admitting: Nurse Practitioner

## 2020-10-28 VITALS — BP 108/58 | HR 75 | Temp 97.9°F | Resp 16 | Ht 70.5 in | Wt 137.8 lb

## 2020-10-28 DIAGNOSIS — D751 Secondary polycythemia: Secondary | ICD-10-CM

## 2020-10-28 DIAGNOSIS — F0151 Vascular dementia with behavioral disturbance: Secondary | ICD-10-CM | POA: Diagnosis not present

## 2020-10-28 DIAGNOSIS — F01518 Vascular dementia, unspecified severity, with other behavioral disturbance: Secondary | ICD-10-CM

## 2020-10-28 DIAGNOSIS — I639 Cerebral infarction, unspecified: Secondary | ICD-10-CM | POA: Diagnosis not present

## 2020-10-28 DIAGNOSIS — Z23 Encounter for immunization: Secondary | ICD-10-CM

## 2020-10-28 DIAGNOSIS — Z0001 Encounter for general adult medical examination with abnormal findings: Secondary | ICD-10-CM | POA: Diagnosis not present

## 2020-10-28 DIAGNOSIS — I10 Essential (primary) hypertension: Secondary | ICD-10-CM | POA: Diagnosis not present

## 2020-10-28 DIAGNOSIS — R300 Dysuria: Secondary | ICD-10-CM | POA: Diagnosis not present

## 2020-10-28 DIAGNOSIS — Z1211 Encounter for screening for malignant neoplasm of colon: Secondary | ICD-10-CM

## 2020-10-28 MED ORDER — PNEUMOCOCCAL 13-VAL CONJ VACC IM SUSP: 0.5000 mL | Freq: Once | INTRAMUSCULAR | 0 refills | Status: AC

## 2020-10-28 NOTE — Progress Notes (Signed)
[facility] [place], [place] [address]  Internal MEDICINE  Office Visit Note  Patient Name: Ian Conley  [id number]  [id number]  Date of Service: 11/17/2020   Pt is here for routine health maintenance examination  Chief Complaint  Patient presents with  . Medicare Wellness  . Hypertension  . Anemia  . policy update form    received     The patient is here for health maintenance exam. The patient presents to the office with family member. Both state that the patient is doing well. His blood pressure is low/normal and is well controlled. Depression and mild dementia are doing well with current medication. He is due to have colon cancer screening. He needs to have a referral to GI for this today. He would like to get his flu shot while he is in the office today. He should also get Prevnar 13 vaccine which is administered at his pharmacy. He did have a CT chest for lung cancer screening done 08/2020. This was benign in appearance. He does have emphysema, aortic atherosclerosis, and coronary artery calcifications. He has no new concerns or complaints today.     Current Medication: Outpatient Encounter Medications as of 10/28/2020  Medication Sig  . amLODipine (NORVASC) 10 MG tablet Take 1 tablet (10 mg total) by mouth daily.  Marland Kitchen atorvastatin (LIPITOR) 80 MG tablet TAKE 1 TABLET(80 MG) BY MOUTH DAILY AT 6 PM  . dipyridamole-aspirin (AGGRENOX) 200-25 MG 12hr capsule Take 1 capsule by mouth 2 (two) times daily.  Marland Kitchen donepezil (ARICEPT) 10 MG tablet Take 1 tablet by mouth daily.   . memantine (NAMENDA) 5 MG tablet Take 5 mg by mouth 2 (two) times daily.  . mirtazapine (REMERON) 7.5 MG tablet TAKE 1 TABLET(7.5 MG) BY MOUTH AT BEDTIME  . Multiple Vitamins-Minerals (YOUR LIFE MULTI MENS 50+) TABS Take by mouth.  . polyethylene glycol powder (GLYCOLAX/MIRALAX) powder as needed.   . [DISCONTINUED] Loratadine (CLARITIN PO) Take 1 tablet by mouth daily.   .  [DISCONTINUED] LORazepam (ATIVAN) 0.5 MG tablet Take 1/2 to 1 tablet po BID prn agitation.  . [EXPIRED] pneumococcal 13-valent conjugate vaccine (PREVNAR 13) SUSP injection Inject 0.5 mLs into the muscle once for 1 dose.  . [DISCONTINUED] ibuprofen (ADVIL) 600 MG tablet Take 1 tablet (600 mg total) by mouth every 8 (eight) hours as needed. (Patient not taking: Reported on 08/12/2020)  . [DISCONTINUED] pantoprazole (PROTONIX) 20 MG tablet Take 1 tablet (20 mg total) by mouth 2 (two) times daily before a meal. (Patient not taking: Reported on 08/12/2020)  . [DISCONTINUED] traMADol (ULTRAM) 50 MG tablet Take 1 tablet (50 mg total) by mouth every 12 (twelve) hours as needed. (Patient not taking: Reported on 08/12/2020)   No facility-administered encounter medications on file as of 10/28/2020.    Surgical History: History reviewed. No pertinent surgical history.  Medical History: Past Medical History:  Diagnosis Date  . Allergy   . Anemia   . Hypertension   . Pancreatitis   . Personal history of tobacco use, presenting hazards to health 03/25/2016  . Polycythemia   . Stroke [facility])    2008 9/11    Family History: Family History  Problem Relation Age of Onset  . Diabetes Mother   . Heart failure Mother   . Cancer Father        Throat   . Stroke Father   . Hypertension Father   . HIV Brother   . Thyroid disease Sister  thyroid cancer  . Hypertension Sister        both sisters      Review of Systems  Constitutional: Negative for activity change, chills, fatigue and unexpected weight change.  HENT: Negative for congestion, postnasal drip, rhinorrhea, sneezing and sore throat.   Respiratory: Negative for cough, chest tightness, shortness of breath and wheezing.   Cardiovascular: Negative for chest pain and palpitations.       Blood pressure low/normal  Gastrointestinal: Negative for abdominal pain, constipation, diarrhea, nausea and vomiting.  Genitourinary: Negative for dysuria  and frequency.  Musculoskeletal: Negative for arthralgias, back pain, joint swelling and neck pain.  Skin: Negative for rash.  Allergic/Immunologic: Negative for environmental allergies.  Neurological: Positive for weakness. Negative for tremors and numbness.       Generalized weakness continues to improve.   Hematological: Negative for adenopathy. Does not bruise/bleed easily.  Psychiatric/Behavioral: Negative for behavioral problems (Depression), sleep disturbance and suicidal ideas. The patient is nervous/anxious.      Today's Vitals   10/28/20 1447  BP: (!) 108/58  Pulse: 75  Resp: 16  Temp: 97.9 F (36.6 C)  SpO2: 96%  Weight: 137 lb 12.8 oz (62.5 kg)  Height: 5' 10.5" (1.791 m)   Body mass index is 19.49 kg/m.  Physical Exam Vitals and nursing note reviewed.  Constitutional:      General: He is not in acute distress.    Appearance: Normal appearance. He is well-developed. He is not diaphoretic.  HENT:     Head: Normocephalic and atraumatic.     Nose: Nose normal.     Mouth/Throat:     Pharynx: No oropharyngeal exudate.  Eyes:     Pupils: Pupils are equal, round, and reactive to light.  Neck:     Thyroid: No thyromegaly.     Vascular: No carotid bruit or JVD.     Trachea: No tracheal deviation.  Cardiovascular:     Rate and Rhythm: Normal rate and regular rhythm.     Pulses: Normal pulses.     Heart sounds: Normal heart sounds. No murmur heard.  No friction rub. No gallop.   Pulmonary:     Effort: Pulmonary effort is normal. No respiratory distress.     Breath sounds: Normal breath sounds. No wheezing or rales.  Chest:     Chest wall: No tenderness.  Abdominal:     General: Bowel sounds are normal.     Palpations: Abdomen is soft.     Tenderness: There is no abdominal tenderness.  Musculoskeletal:        General: Normal range of motion.     Cervical back: Normal range of motion and neck supple.  Lymphadenopathy:     Cervical: No cervical adenopathy.   Skin:    General: Skin is warm and dry.     Capillary Refill: Capillary refill takes less than 2 seconds.  Neurological:     Mental Status: He is alert and oriented to person, place, and time. Mental status is at baseline.     Cranial Nerves: No cranial nerve deficit.  Psychiatric:        Mood and Affect: Mood normal.        Behavior: Behavior normal.        Thought Content: Thought content normal.        Judgment: Judgment normal.    Depression screen [facility] 2/9 10/28/2020 07/17/2020 01/19/2020 10/23/2019 06/26/2019  Decreased Interest 0 0 0 0 0  Down, Depressed, Hopeless 0 0 0  0 0  PHQ - 2 Score 0 0 0 0 0  Altered sleeping - - - - -  Tired, decreased energy - - - - -  Change in appetite - - - - -  Feeling bad or failure about yourself  - - - - -  Trouble concentrating - - - - -  Moving slowly or fidgety/restless - - - - -  Suicidal thoughts - - - - -  PHQ-9 Score - - - - -    Functional Status Survey: Is the patient deaf or have difficulty hearing?: No Does the patient have difficulty seeing, even when wearing glasses/contacts?: No Does the patient have difficulty concentrating, remembering, or making decisions?: No Does the patient have difficulty walking or climbing stairs?: No Does the patient have difficulty dressing or bathing?: No Does the patient have difficulty doing errands alone such as visiting a doctor's office or shopping?: Yes  MMSE - Mini Mental State Exam 10/28/2020 10/23/2019 10/20/2018  Orientation to time 0 5 5  Orientation to Place 0 5 5  Registration 3 3 3   Attention/ Calculation 5 5 5   Recall 0 3 3  Language- name 2 objects 2 2 2   Language- repeat 0 1 1  Language- follow 3 step command 0 3 3  Language- read & follow direction 1 1 1   Write a sentence 1 1 1   Copy design 1 1 1   Total score 13 30 30     Fall Risk  10/28/2020 07/17/2020 01/19/2020 10/23/2019 06/26/2019  Falls in the past year? 0 0 0 0 0  Comment - - - - -  Number falls in past yr: - - - - -   Injury with Fall? - - - - -  Risk for fall due to : No Fall Risks - - - -  Follow up Falls evaluation completed - - - -      LABS: Recent Results (from the past 2160 hour(s))  UA/M w/rflx Culture, Routine     Status: None   Collection Time: 10/28/20  1:47 PM   Specimen: Urine   Urine  Result Value Ref Range   Specific Gravity, UA 1.018 1.005 - 1.030   pH, UA 7.0 5.0 - 7.5   Color, UA Yellow Yellow   Appearance Ur Clear Clear   Leukocytes,UA Negative Negative   Protein,UA Negative Negative/Trace   Glucose, UA Negative Negative   Ketones, UA Negative Negative   RBC, UA Negative Negative   Bilirubin, UA Negative Negative   Urobilinogen, Ur 0.2 0.2 - 1.0 mg/dL   Nitrite, UA Negative Negative   Microscopic Examination Comment     Comment: Microscopic follows if indicated.   Microscopic Examination See below:     Comment: Microscopic was indicated and was performed.   Urinalysis Reflex Comment     Comment: This specimen will not reflex to a Urine Culture.  Microscopic Examination     Status: None   Collection Time: 10/28/20  1:47 PM   Urine  Result Value Ref Range   WBC, UA None seen 0 - 5 /hpf   RBC 0-2 0 - 2 /hpf   Epithelial Cells (non renal) None seen 0 - 10 /hpf   Casts None seen None seen /lpf   Bacteria, UA None seen None seen/Few  Ferritin     Status: None   Collection Time: 11/11/20  1:56 PM  Result Value Ref Range   Ferritin 283 24 - 336 ng/mL    Comment: Performed  at [facility], [address] [place], [place] [address]  CBC     Status: None   Collection Time: 11/11/20  1:56 PM  Result Value Ref Range   WBC 8.5 4.0 - 10.5 K/uL   RBC 4.75 4.22 - 5.81 MIL/uL   Hemoglobin 14.0 13.0 - 17.0 g/dL   HCT 42.7 39 - 52 %   MCV 89.9 80.0 - 100.0 fL   MCH 29.5 26.0 - 34.0 pg   MCHC 32.8 30.0 - 36.0 g/dL   RDW 14.1 11.5 - 15.5 %   Platelets 174 150 - 400 K/uL   nRBC 0.0 0.0 - 0.2 %    Comment: Performed at [facility], [address] [place],  [address]    Assessment/Plan: 1. Encounter for general adult medical examination with abnormal findings Annual health maintenance exam today.   2. Essential hypertension Stable. Continue bp medication as prescribed   3. Cerebrovascular accident (CVA), unspecified mechanism [facility]) Patient followed per neurology. Continues to improve.   4. Dementia, multiinfarct, with behavioral disturbance (HCC) Stable. Continue to monitor.   5. Polycythemia Patient followed per hematology.   6. Screening for colon cancer Refer to GI for screening colonoscopy.  - Ambulatory referral to Gastroenterology  7. Flu vaccine need Flu vaccine administered today.  - Flu Vaccine MDCK QUAD PF  8. Need for vaccination against Streptococcus pneumoniae using pneumococcal conjugate vaccine 7 Prescription for prevnar 13 sent to his pharmacy for administration.  - pneumococcal 13-valent conjugate vaccine (PREVNAR 13) SUSP injection; Inject 0.5 mLs into the muscle once for 1 dose.  Dispense: 0.5 mL; Refill: 0  9. Dysuria - UA/M w/rflx Culture, Routine  General Counseling: Johnel verbalizes understanding of the findings of todays visit and agrees with plan of treatment. I have discussed any further diagnostic evaluation that may be needed or ordered today. We also reviewed his medications today. he has been encouraged to call the office with any questions or concerns that should arise related to todays visit.    Counseling:  This patient was seen by Leretha Pol FNP Collaboration with Dr Lavera Guise as a part of collaborative care agreement  Orders Placed This Encounter  Procedures  . Microscopic Examination  . Flu Vaccine MDCK QUAD PF  . UA/M w/rflx Culture, Routine  . Ambulatory referral to Gastroenterology    Meds ordered this encounter  Medications  . pneumococcal 13-valent conjugate vaccine (PREVNAR 13) SUSP injection    Sig: Inject 0.5 mLs into the muscle once for 1 dose.     Dispense:  0.5 mL    Refill:  0    Order Specific Question:   Supervising Provider    Answer:   Lavera Guise [[id number]]    Total time spent: 75 Minutes  Time spent includes review of chart, medications, test results, and follow up plan with the patient.     Lavera Guise, MD  Internal Medicine

## 2020-10-29 LAB — MICROSCOPIC EXAMINATION
Bacteria, UA: NONE SEEN
Casts: NONE SEEN /lpf
Epithelial Cells (non renal): NONE SEEN /hpf (ref 0–10)
WBC, UA: NONE SEEN /hpf (ref 0–5)

## 2020-10-29 LAB — UA/M W/RFLX CULTURE, ROUTINE
Bilirubin, UA: NEGATIVE
Glucose, UA: NEGATIVE
Ketones, UA: NEGATIVE
Leukocytes,UA: NEGATIVE
Nitrite, UA: NEGATIVE
Protein,UA: NEGATIVE
RBC, UA: NEGATIVE
Specific Gravity, UA: 1.018 (ref 1.005–1.030)
Urobilinogen, Ur: 0.2 mg/dL (ref 0.2–1.0)
pH, UA: 7 (ref 5.0–7.5)

## 2020-11-07 ENCOUNTER — Telehealth (INDEPENDENT_AMBULATORY_CARE_PROVIDER_SITE_OTHER): Payer: Self-pay | Admitting: Gastroenterology

## 2020-11-07 ENCOUNTER — Other Ambulatory Visit: Payer: Self-pay

## 2020-11-07 DIAGNOSIS — Z1211 Encounter for screening for malignant neoplasm of colon: Secondary | ICD-10-CM

## 2020-11-07 MED ORDER — SUTAB 1479-225-188 MG PO TABS: 1.0000 | ORAL_TABLET | Freq: Once | ORAL | 0 refills | Status: AC

## 2020-11-07 NOTE — Progress Notes (Signed)
Gastroenterology Pre-Procedure Review  Request Date: Tuesday 12/03/20 Requesting Physician: Dr. [name]  PATIENT REVIEW QUESTIONS: The patient responded to the following health history questions as indicated:    1. Are you having any GI issues? no 2. Do you have a personal history of Polyps? no 3. Do you have a family history of Colon Cancer or Polyps? yes (Mom, niece colon polyps) 4. Diabetes Mellitus? no 5. Joint replacements in the past 12 months?no 6. Major health problems in the past 3 months?no 7. Any artificial heart valves, MVP, or defibrillator?no    MEDICATIONS & ALLERGIES:    Patient reports the following regarding taking any anticoagulation/antiplatelet therapy:   Plavix, Coumadin, Eliquis, Xarelto, Lovenox, Pradaxa, Brilinta, or Effient? no Aspirin? no  Patient confirms/reports the following medications:  Current Outpatient Medications  Medication Sig Dispense Refill  . amLODipine (NORVASC) 10 MG tablet Take 1 tablet (10 mg total) by mouth daily. 90 tablet 1  . atorvastatin (LIPITOR) 80 MG tablet TAKE 1 TABLET(80 MG) BY MOUTH DAILY AT 6 PM 90 tablet 1  . CLARITIN 10 MG CAPS Take by mouth daily.    Marland Kitchen dipyridamole-aspirin (AGGRENOX) 200-25 MG 12hr capsule Take 1 capsule by mouth 2 (two) times daily. 180 capsule 1  . donepezil (ARICEPT) 10 MG tablet Take 1 tablet by mouth daily.     Marland Kitchen LORazepam (ATIVAN) 0.5 MG tablet Take 1/2 to 1 tablet po BID prn agitation. 60 tablet 1  . memantine (NAMENDA) 5 MG tablet Take 5 mg by mouth 2 (two) times daily.    . mirtazapine (REMERON) 7.5 MG tablet TAKE 1 TABLET(7.5 MG) BY MOUTH AT BEDTIME 90 tablet 1  . Multiple Vitamins-Minerals (YOUR LIFE MULTI MENS 50+) TABS Take by mouth.    . polyethylene glycol powder (GLYCOLAX/MIRALAX) powder as needed.   0  . Sodium Sulfate-Mag Sulfate-KCl (SUTAB) [phone] MG TABS Take 1 kit by mouth once for 1 dose. 24 tablet 0   No current facility-administered medications for this visit.    Patient  confirms/reports the following allergies:  Allergies  Allergen Reactions  . Penicillins Rash    Has patient had a PCN reaction causing immediate rash, facial/tongue/throat swelling, SOB or lightheadedness with hypotension: Yes Has patient had a PCN reaction causing severe rash involving mucus membranes or skin necrosis: Unknown Has patient had a PCN reaction that required hospitalization: No Has patient had a PCN reaction occurring within the last 10 years: No If all of the above answers are "NO", then may proceed with Cephalosporin use.    No orders of the defined types were placed in this encounter.   AUTHORIZATION INFORMATION Primary Insurance: 1D#: Group #:  Secondary Insurance: 1D#: Group #:  SCHEDULE INFORMATION: Date: Tuesday 12/03/20 Time: Location:[facility]

## 2020-11-11 ENCOUNTER — Inpatient Hospital Stay: Payer: Medicare Other | Attending: Hematology and Oncology

## 2020-11-11 ENCOUNTER — Other Ambulatory Visit: Payer: Self-pay

## 2020-11-11 DIAGNOSIS — D751 Secondary polycythemia: Secondary | ICD-10-CM | POA: Diagnosis not present

## 2020-11-11 DIAGNOSIS — R790 Abnormal level of blood mineral: Secondary | ICD-10-CM | POA: Diagnosis not present

## 2020-11-11 DIAGNOSIS — F0151 Vascular dementia with behavioral disturbance: Secondary | ICD-10-CM

## 2020-11-11 DIAGNOSIS — F01518 Vascular dementia, unspecified severity, with other behavioral disturbance: Secondary | ICD-10-CM

## 2020-11-11 LAB — CBC
HCT: 42.7 % (ref 39.0–52.0)
Hemoglobin: 14 g/dL (ref 13.0–17.0)
MCH: 29.5 pg (ref 26.0–34.0)
MCHC: 32.8 g/dL (ref 30.0–36.0)
MCV: 89.9 fL (ref 80.0–100.0)
Platelets: 174 10*3/uL (ref 150–400)
RBC: 4.75 MIL/uL (ref 4.22–5.81)
RDW: 14.1 % (ref 11.5–15.5)
WBC: 8.5 10*3/uL (ref 4.0–10.5)
nRBC: 0 % (ref 0.0–0.2)

## 2020-11-11 LAB — FERRITIN: Ferritin: 283 ng/mL (ref 24–336)

## 2020-11-12 ENCOUNTER — Telehealth: Payer: Self-pay

## 2020-11-12 MED ORDER — LORAZEPAM 0.5 MG PO TABS: ORAL_TABLET | ORAL | 2 refills | Status: DC

## 2020-11-12 NOTE — Telephone Encounter (Signed)
Received anticoagulation clearance request from [facility] for pt to hold aggrenox prior to colonoscopy. Fax sent back notifying them that medication is prescribed by Leretha Pol, not Dr. Mike Gip.

## 2020-11-19 ENCOUNTER — Telehealth: Payer: Self-pay

## 2020-11-19 NOTE — Telephone Encounter (Signed)
[name], patient's sister and caregiver has been advised per Nira Conn [facility]oscia's blood thinner information request received to stop Aggrenox 5 days prior to colonoscopy on 12/03/20.  Restart 1 day after procedure.  Patients sister verbalized understanding.  I will mail her a copy of her brothers instructions, and a copy of blood thinner request.  Thanks,  Sharyn Lull, [place]

## 2020-11-26 ENCOUNTER — Encounter: Payer: Self-pay | Admitting: Gastroenterology

## 2020-11-26 ENCOUNTER — Other Ambulatory Visit: Payer: Self-pay

## 2020-11-27 ENCOUNTER — Encounter: Payer: Self-pay | Admitting: Anesthesiology

## 2020-11-29 ENCOUNTER — Other Ambulatory Visit
Admission: RE | Admit: 2020-11-29 | Discharge: 2020-11-29 | Disposition: A | Discharge status: Home / Self Care | Payer: Medicare Other | Source: Ambulatory Visit | Attending: Gastroenterology | Admitting: Gastroenterology

## 2020-11-29 ENCOUNTER — Other Ambulatory Visit: Payer: Self-pay

## 2020-11-29 DIAGNOSIS — Z01812 Encounter for preprocedural laboratory examination: Secondary | ICD-10-CM | POA: Insufficient documentation

## 2020-11-29 DIAGNOSIS — Z20822 Contact with and (suspected) exposure to covid-19: Secondary | ICD-10-CM | POA: Diagnosis not present

## 2020-11-30 LAB — SARS CORONAVIRUS 2 (TAT 6-24 HRS): SARS Coronavirus 2: NEGATIVE

## 2020-12-02 NOTE — Discharge Instructions (Signed)
General Anesthesia, Adult, Care After This sheet gives you information about how to care for yourself after your procedure. Your health care provider may also give you more specific instructions. If you have problems or questions, contact your health care provider. What can I expect after the procedure? After the procedure, the following side effects are common:  Pain or discomfort at the IV site.  Nausea.  Vomiting.  Sore throat.  Trouble concentrating.  Feeling cold or chills.  Weak or tired.  Sleepiness and fatigue.  Soreness and body aches. These side effects can affect parts of the body that were not involved in surgery. Follow these instructions at home:  For at least 24 hours after the procedure:  Have a responsible adult stay with you. It is important to have someone help care for you until you are awake and alert.  Rest as needed.  Do not: ? Participate in activities in which you could fall or become injured. ? Drive. ? Use heavy machinery. ? Drink alcohol. ? Take sleeping pills or medicines that cause drowsiness. ? Make important decisions or sign legal documents. ? Take care of children on your own. Eating and drinking  Follow any instructions from your health care provider about eating or drinking restrictions.  When you feel hungry, start by eating small amounts of foods that are soft and easy to digest (bland), such as toast. Gradually return to your regular diet.  Drink enough fluid to keep your urine pale yellow.  If you vomit, rehydrate by drinking water, juice, or clear broth. General instructions  If you have sleep apnea, surgery and certain medicines can increase your risk for breathing problems. Follow instructions from your health care provider about wearing your sleep device: ? Anytime you are sleeping, including during daytime naps. ? While taking prescription pain medicines, sleeping medicines, or medicines that make you drowsy.  Return to  your normal activities as told by your health care provider. Ask your health care provider what activities are safe for you.  Take over-the-counter and prescription medicines only as told by your health care provider.  If you smoke, do not smoke without supervision.  Keep all follow-up visits as told by your health care provider. This is important. Contact a health care provider if:  You have nausea or vomiting that does not get better with medicine.  You cannot eat or drink without vomiting.  You have pain that does not get better with medicine.  You are unable to pass urine.  You develop a skin rash.  You have a fever.  You have redness around your IV site that gets worse. Get help right away if:  You have difficulty breathing.  You have chest pain.  You have blood in your urine or stool, or you vomit blood. Summary  After the procedure, it is common to have a sore throat or nausea. It is also common to feel tired.  Have a responsible adult stay with you for the first 24 hours after general anesthesia. It is important to have someone help care for you until you are awake and alert.  When you feel hungry, start by eating small amounts of foods that are soft and easy to digest (bland), such as toast. Gradually return to your regular diet.  Drink enough fluid to keep your urine pale yellow.  Return to your normal activities as told by your health care provider. Ask your health care provider what activities are safe for you. This information is not   intended to replace advice given to you by your health care provider. Make sure you discuss any questions you have with your health care provider. Document Revised: 12/10/2017 Document Reviewed: 07/23/2017 Elsevier Patient Education  2020 [employer]  

## 2020-12-03 ENCOUNTER — Telehealth: Payer: Self-pay | Admitting: Gastroenterology

## 2020-12-03 ENCOUNTER — Ambulatory Visit: Admission: RE | Admit: 2020-12-03 | Payer: Medicare Other | Source: Home / Self Care | Admitting: Gastroenterology

## 2020-12-03 SURGERY — COLONOSCOPY WITH PROPOFOL
Anesthesia: General

## 2020-12-03 NOTE — Telephone Encounter (Signed)
Pt sister called to let us know that patient could not get tablets down for proceudre today, sister called hospital to cancel procedure.  She is not sure how to proceed, please call to advise

## 2020-12-03 NOTE — Telephone Encounter (Signed)
Called pt sister and informed her I will send a staff message to Dr. Darene Lamer and her nurse regarding pt unable to get prep pills down. Pt's sister verbalized understanding.

## 2020-12-05 ENCOUNTER — Telehealth: Payer: Self-pay

## 2020-12-05 NOTE — Telephone Encounter (Signed)
-----   Message from Virgel Manifold, MD sent at 12/05/2020  9:52 AM EST ----- Try to schedule him the last procedure for a morning and ask him to start the morning prep 2 hrs before our regular instructions say, to give himself more time to finish it

## 2020-12-05 NOTE — Telephone Encounter (Signed)
Called Ian Conley-patient's sister and she stated that the patient declined to move forward with anything after asking him if he wanted to try again. Mrs. Ian Conley stated that her brother has dementia and alzheimer's and it was hard for him to do the first two times since he got sick (nauseated and vomited).

## 2020-12-19 DIAGNOSIS — R413 Other amnesia: Secondary | ICD-10-CM | POA: Diagnosis not present

## 2020-12-19 DIAGNOSIS — I6789 Other cerebrovascular disease: Secondary | ICD-10-CM | POA: Diagnosis not present

## 2020-12-19 DIAGNOSIS — M4802 Spinal stenosis, cervical region: Secondary | ICD-10-CM | POA: Diagnosis not present

## 2021-01-16 ENCOUNTER — Other Ambulatory Visit: Payer: Self-pay

## 2021-01-16 DIAGNOSIS — I10 Essential (primary) hypertension: Secondary | ICD-10-CM

## 2021-01-16 MED ORDER — AMLODIPINE BESYLATE 10 MG PO TABS: 10.0000 mg | ORAL_TABLET | Freq: Every day | ORAL | 1 refills | Status: DC

## 2021-01-21 ENCOUNTER — Ambulatory Visit (INDEPENDENT_AMBULATORY_CARE_PROVIDER_SITE_OTHER): Payer: Medicare Other | Admitting: Internal Medicine

## 2021-01-21 VITALS — BP 120/56 | HR 88 | Temp 97.9°F | Resp 16 | Ht 71.0 in | Wt 144.0 lb

## 2021-01-21 DIAGNOSIS — F0151 Vascular dementia with behavioral disturbance: Secondary | ICD-10-CM

## 2021-01-21 DIAGNOSIS — I639 Cerebral infarction, unspecified: Secondary | ICD-10-CM

## 2021-01-21 DIAGNOSIS — J439 Emphysema, unspecified: Secondary | ICD-10-CM | POA: Diagnosis not present

## 2021-01-21 DIAGNOSIS — Z1211 Encounter for screening for malignant neoplasm of colon: Secondary | ICD-10-CM | POA: Diagnosis not present

## 2021-01-21 DIAGNOSIS — I10 Essential (primary) hypertension: Secondary | ICD-10-CM | POA: Diagnosis not present

## 2021-01-21 DIAGNOSIS — Z125 Encounter for screening for malignant neoplasm of prostate: Secondary | ICD-10-CM | POA: Diagnosis not present

## 2021-01-21 DIAGNOSIS — I6389 Other cerebral infarction: Secondary | ICD-10-CM

## 2021-01-21 DIAGNOSIS — F01518 Vascular dementia, unspecified severity, with other behavioral disturbance: Secondary | ICD-10-CM

## 2021-01-21 MED ORDER — LORAZEPAM 0.5 MG PO TABS: ORAL_TABLET | ORAL | 2 refills | Status: DC

## 2021-01-21 NOTE — Progress Notes (Signed)
[facility] [place], [place] [address]  Internal MEDICINE  Office Visit Note  Patient Name: Ian Conley  [id number]  [id number]  Date of Service: 01/30/2021  Chief Complaint  Patient presents with  . Hypertension    3 month f-up  . Hyperlipidemia  . Anxiety    HPI Pt is here for routine follow up. He has multiple medical problems. Mostly stable, he is with his sister in the room. Unable tp recognize me, was seen neurology for treatment of dementia. Never had EGD or colonoscopy by GI. , UGI was found to be abnormal. But never followed up EGD. Sleeps well with current meds  Ct chest from 10/21 as follows  1. Lung-RADS 2, benign appearance or behavior. Continue annual screening with low-dose chest CT without contrast in 12 months. 2. Emphysema and aortic atherosclerosis. 3. Coronary artery calcifications. 4. Aortic Atherosclerosis (ICD10-I70.0) and Emphysema (ICD10-J43.9).  Current Medication: Outpatient Encounter Medications as of 01/21/2021  Medication Sig  . amLODipine (NORVASC) 10 MG tablet Take 1 tablet (10 mg total) by mouth daily.  Marland Kitchen atorvastatin (LIPITOR) 80 MG tablet TAKE 1 TABLET(80 MG) BY MOUTH DAILY AT 6 PM  . CLARITIN 10 MG CAPS Take by mouth daily.  Marland Kitchen dipyridamole-aspirin (AGGRENOX) 200-25 MG 12hr capsule Take 1 capsule by mouth 2 (two) times daily.  Marland Kitchen donepezil (ARICEPT) 10 MG tablet Take 1 tablet by mouth daily.   . memantine (NAMENDA) 5 MG tablet Take 5 mg by mouth 2 (two) times daily.  . mirtazapine (REMERON) 7.5 MG tablet TAKE 1 TABLET(7.5 MG) BY MOUTH AT BEDTIME  . Multiple Vitamins-Minerals (YOUR LIFE MULTI MENS 50+) TABS Take by mouth.  . polyethylene glycol powder (GLYCOLAX/MIRALAX) powder as needed.   . [DISCONTINUED] LORazepam (ATIVAN) 0.5 MG tablet Take 1/2 to 1 tablet po BID prn agitation.  Marland Kitchen LORazepam (ATIVAN) 0.5 MG tablet Take 1/2 to 1 tablet po BID prn agitation.   No facility-administered encounter medications on file  as of 01/21/2021.    Surgical History: No past surgical history on file.  Medical History: Past Medical History:  Diagnosis Date  . Allergy   . Anemia   . Hypertension   . Pancreatitis   . Personal history of tobacco use, presenting hazards to health 03/25/2016  . Polycythemia   . Stroke [facility])    2008 9/11    Family History: Family History  Problem Relation Age of Onset  . Diabetes Mother   . Heart failure Mother   . Cancer Father        Throat   . Stroke Father   . Hypertension Father   . HIV Brother   . Thyroid disease Sister        thyroid cancer  . Hypertension Sister        both sisters    Social History   Socioeconomic History  . Marital status: Widowed    Spouse name: Not on file  . Number of children: Not on file  . Years of education: Not on file  . Highest education level: Not on file  Occupational History  . Not on file  Tobacco Use  . Smoking status: Current Every Day Smoker    Packs/day: 1.00    Years: 45.00    Pack years: 45.00    Types: Cigarettes  . Smokeless tobacco: Never Used  Vaping Use  . Vaping Use: Never used  Substance and Sexual Activity  . Alcohol use: Not Currently    Comment: when  pt was at home, no alcohol since July, 2019  . Drug use: No  . Sexual activity: Not on file  Other Topics Concern  . Not on file  Social History Narrative  . Not on file   Social Determinants of Health   Financial Resource Strain: Not on file  Food Insecurity: Not on file  Transportation Needs: Not on file  Physical Activity: Not on file  Stress: Not on file  Social Connections: Not on file  Intimate Partner Violence: Not on file      Review of Systems  Constitutional: Negative for chills, fatigue and unexpected weight change.  HENT: Positive for postnasal drip. Negative for congestion, rhinorrhea, sneezing and sore throat.   Eyes: Negative for redness.  Respiratory: Negative for cough, chest tightness and shortness of breath.    Cardiovascular: Negative for chest pain and palpitations.  Gastrointestinal: Negative for abdominal pain, constipation, diarrhea, nausea and vomiting.  Genitourinary: Negative for dysuria and frequency.  Musculoskeletal: Negative for arthralgias, back pain, joint swelling and neck pain.  Skin: Negative for rash.  Neurological: Negative.  Negative for tremors and numbness.  Hematological: Negative for adenopathy. Does not bruise/bleed easily.  Psychiatric/Behavioral: Negative for behavioral problems (Depression), sleep disturbance and suicidal ideas. The patient is not nervous/anxious.     Vital Signs: BP (!) 120/56   Pulse 88   Temp 97.9 F (36.6 C)   Resp 16   Ht 5\' 11"  (1.803 m)   Wt 144 lb (65.3 kg)   SpO2 97%   BMI 20.08 kg/m    Physical Exam Constitutional:      General: He is not in acute distress.    Appearance: He is well-developed. He is not diaphoretic.  HENT:     Head: Normocephalic and atraumatic.     Mouth/Throat:     Pharynx: No oropharyngeal exudate.  Eyes:     Pupils: Pupils are equal, round, and reactive to light.  Neck:     Thyroid: No thyromegaly.     Vascular: No JVD.     Trachea: No tracheal deviation.  Cardiovascular:     Rate and Rhythm: Normal rate and regular rhythm.     Heart sounds: Normal heart sounds. No murmur heard. No friction rub. No gallop.   Pulmonary:     Effort: Pulmonary effort is normal. No respiratory distress.     Breath sounds: No wheezing or rales.  Chest:     Chest wall: No tenderness.  Abdominal:     General: Bowel sounds are normal.     Palpations: Abdomen is soft.  Musculoskeletal:        General: Normal range of motion.     Cervical back: Normal range of motion and neck supple.  Lymphadenopathy:     Cervical: No cervical adenopathy.  Skin:    General: Skin is warm and dry.  Neurological:     Mental Status: He is alert and oriented to person, place, and time.     Cranial Nerves: No cranial nerve deficit.   Psychiatric:        Behavior: Behavior normal.        Thought Content: Thought content normal.        Judgment: Judgment normal.        Assessment/Plan: 1. Essential hypertension Well controlled , will continue norvasc as before, might want to decrease if BP is low in future, since he has lost wt   2. Cerebrovascular accident (CVA) due to other mechanism ([place]) Continues to smoke,  on aggrenox as before  3. Dementia, multiinfarct, with behavioral disturbance ([place]) Well controlled on current medications, did gain about 5 lbs since last visit  - LORazepam (ATIVAN) 0.5 MG tablet; Take 1/2 to 1 tablet po BID prn agitation.  Dispense: 60 tablet; Refill: 2  4. Screening for prostate cancer - PSA  5. Chronic pulmonary emphysema not affecting current episode of care [facility]) Smoking cessation counseling: 1. Pt acknowledges the risks of long term smoking, she will try to quite smoking. 2. Options for different medications including nicotine products, chewing gum, patch etc, Wellbutrin and Chantix is discussed 3. Goal and date of compete cessation is discussed 4. Total time spent in smoking cessation is 10 min.  6. Screening colonoscopy  Willing to do cologaurd now, missed several appointments for COLONOSCOPY   General Counseling: Dominik verbalizes understanding of the findings of todays visit and agrees with plan of treatment. I have discussed any further diagnostic evaluation that may be needed or ordered today. We also reviewed his medications today. he has been encouraged to call the office with any questions or concerns that should arise related to todays visit.    Orders Placed This Encounter  Procedures  . CBC with Differential/Platelet  . Lipid Panel With LDL/HDL Ratio  . TSH  . T4, free  . Comprehensive metabolic panel  . B12 and Folate Panel  . PSA    Meds ordered this encounter  Medications  . LORazepam (ATIVAN) 0.5 MG tablet    Sig: Take 1/2 to 1 tablet po BID prn  agitation.    Dispense:  60 tablet    Refill:  2    Total time spent: 30Minutes Time spent includes review of chart, medications, test results, and follow up plan with the patient.   [place] Controlled Substance Database was reviewed by me.   Dr Lavera Guise Internal medicine

## 2021-01-22 DIAGNOSIS — I639 Cerebral infarction, unspecified: Secondary | ICD-10-CM | POA: Diagnosis not present

## 2021-01-22 DIAGNOSIS — D529 Folate deficiency anemia, unspecified: Secondary | ICD-10-CM | POA: Diagnosis not present

## 2021-01-22 DIAGNOSIS — J439 Emphysema, unspecified: Secondary | ICD-10-CM | POA: Diagnosis not present

## 2021-01-22 DIAGNOSIS — E538 Deficiency of other specified B group vitamins: Secondary | ICD-10-CM | POA: Diagnosis not present

## 2021-01-22 DIAGNOSIS — I10 Essential (primary) hypertension: Secondary | ICD-10-CM | POA: Diagnosis not present

## 2021-01-23 ENCOUNTER — Ambulatory Visit: Payer: Medicare Other | Admitting: Podiatry

## 2021-01-23 ENCOUNTER — Other Ambulatory Visit: Payer: Self-pay

## 2021-01-23 ENCOUNTER — Encounter: Payer: Self-pay | Admitting: Podiatry

## 2021-01-23 DIAGNOSIS — M79674 Pain in right toe(s): Secondary | ICD-10-CM

## 2021-01-23 DIAGNOSIS — B351 Tinea unguium: Secondary | ICD-10-CM

## 2021-01-23 DIAGNOSIS — M79675 Pain in left toe(s): Secondary | ICD-10-CM

## 2021-01-23 DIAGNOSIS — L84 Corns and callosities: Secondary | ICD-10-CM

## 2021-01-23 LAB — COMPREHENSIVE METABOLIC PANEL
ALT: 18 IU/L (ref 0–44)
AST: 19 IU/L (ref 0–40)
Albumin/Globulin Ratio: 1.7 (ref 1.2–2.2)
Albumin: 4.3 g/dL (ref 3.8–4.8)
Alkaline Phosphatase: 132 IU/L — ABNORMAL HIGH (ref 44–121)
BUN/Creatinine Ratio: 4 — ABNORMAL LOW (ref 10–24)
BUN: 4 mg/dL — ABNORMAL LOW (ref 8–27)
Bilirubin Total: 0.2 mg/dL (ref 0.0–1.2)
CO2: 23 mmol/L (ref 20–29)
Calcium: 9.1 mg/dL (ref 8.6–10.2)
Chloride: 107 mmol/L — ABNORMAL HIGH (ref 96–106)
Creatinine, Ser: 1 mg/dL (ref 0.76–1.27)
GFR calc Af Amer: 89 mL/min/{1.73_m2} (ref >59)
GFR calc non Af Amer: 77 mL/min/{1.73_m2} (ref >59)
Globulin, Total: 2.5 g/dL (ref 1.5–4.5)
Glucose: 82 mg/dL (ref 65–99)
Potassium: 3.9 mmol/L (ref 3.5–5.2)
Sodium: 144 mmol/L (ref 134–144)
Total Protein: 6.8 g/dL (ref 6.0–8.5)

## 2021-01-23 LAB — CBC WITH DIFFERENTIAL/PLATELET
Basophils Absolute: 0 10*3/uL (ref 0.0–0.2)
Basos: 0 %
EOS (ABSOLUTE): 0 10*3/uL (ref 0.0–0.4)
Eos: 0 %
Hematocrit: 38.9 % (ref 37.5–51.0)
Hemoglobin: 13.4 g/dL (ref 13.0–17.7)
Immature Grans (Abs): 0 10*3/uL (ref 0.0–0.1)
Immature Granulocytes: 0 %
Lymphocytes Absolute: 0.8 10*3/uL (ref 0.7–3.1)
Lymphs: 10 %
MCH: 29.5 pg (ref 26.6–33.0)
MCHC: 34.4 g/dL (ref 31.5–35.7)
MCV: 86 fL (ref 79–97)
Monocytes Absolute: 0.5 10*3/uL (ref 0.1–0.9)
Monocytes: 7 %
Neutrophils Absolute: 6.3 10*3/uL (ref 1.4–7.0)
Neutrophils: 83 %
Platelets: 156 10*3/uL (ref 150–450)
RBC: 4.54 x10E6/uL (ref 4.14–5.80)
RDW: 12.6 % (ref 11.6–15.4)
WBC: 7.6 10*3/uL (ref 3.4–10.8)

## 2021-01-23 LAB — B12 AND FOLATE PANEL
Folate: >20 ng/mL (ref >3.0)
Vitamin B-12: 762 pg/mL (ref 232–1245)

## 2021-01-23 LAB — LIPID PANEL WITH LDL/HDL RATIO
Cholesterol, Total: 96 mg/dL — ABNORMAL LOW (ref 100–199)
HDL: 44 mg/dL (ref >39)
LDL Chol Calc (NIH): 35 mg/dL (ref 0–99)
LDL/HDL Ratio: 0.8 ratio (ref 0.0–3.6)
Triglycerides: 82 mg/dL (ref 0–149)
VLDL Cholesterol Cal: 17 mg/dL (ref 5–40)

## 2021-01-23 LAB — TSH: TSH: 0.857 u[IU]/mL (ref 0.450–4.500)

## 2021-01-23 LAB — PSA: Prostate Specific Ag, Serum: 0.4 ng/mL (ref 0.0–4.0)

## 2021-01-23 LAB — T4, FREE: Free T4: 1.32 ng/dL (ref 0.82–1.77)

## 2021-01-23 NOTE — Progress Notes (Signed)
This patient returns to my office for at risk foot care.  This patient requires this care by a professional since this patient will be at risk due to having thrombocytopenia. This patient presents to the office with his sister.  This patient is unable to cut nails himself since the patient cannot reach his nails.These nails are painful walking and wearing shoes.  Patient also has painful callus under his big toe joint right foot.  This patient presents for at risk foot care today.  General Appearance  Alert, conversant and in no acute stress.  Vascular  Dorsalis pedis and posterior tibial  pulses are palpable  bilaterally.  Capillary return is within normal limits  bilaterally. Temperature is within normal limits  bilaterally.  Neurologic  Senn-Weinstein monofilament wire test within normal limits  bilaterally. Muscle power within normal limits bilaterally.  Nails Thick disfigured discolored nails with subungual debris  from hallux to fifth toes bilaterally. No evidence of bacterial infection or drainage bilaterally.  Orthopedic  No limitations of motion  feet .  No crepitus or effusions noted.  No bony pathology or digital deformities noted.  Skin  normotropic skin  noted bilaterally.  No signs of infections or ulcers noted.   Callus sub 1st MPJ right foot.  Onychomycosis  Pain in right toes  Pain in left toes  Pre-ulcerous callus right foot.  Consent was obtained for treatment procedures.   Mechanical debridement of nails 1-5  bilaterally performed with a nail nipper.  Filed with dremel without incident. Debride callus with # 15 blade followed by dremel usage.   Return office visit     3 months                 Told patient to return for periodic foot care and evaluation due to potential at risk complications.   Gardiner Barefoot DPM

## 2021-01-24 NOTE — Progress Notes (Signed)
Labs reviewed, will be discussed on next follow up

## 2021-01-28 ENCOUNTER — Ambulatory Visit: Payer: Medicare Other | Admitting: Hospice and Palliative Medicine

## 2021-02-11 NOTE — Progress Notes (Signed)
[facility]  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  HPI: The patient was last seen in the hematology clinic on 08/12/2020. At that time, he was doing well.  Weight was down 1 pound.  Nutrition was affected by dental issues. Exam is stable. Hematocrit was 40.6, hemoglobin 13.4, platelets 167,000, WBC 6,300. Ferritin was 265 with an iron saturation of 39% and a TIBC of 211. Sed rate was 7. We discussed follow-up with GI.  Low dose chest CT on 09/04/2020 revealed Lung-RADS 2, benign appearance or behavior. There were several small nodules (largest 3.7 mm in the lateral right lobe).  Continue annual screening with low-dose chest CT without contrast in 12 months. There was emphysema and aortic atherosclerosis. There were coronary artery calcifications.  The patient was scheduled for a colonoscopy on 12/03/2020 but it was cancelled because he was unable to swallow the prep pills.  Labs on 11/11/2020 revealed a hematocrit of 42.7, hemoglobin 14.0, platelets 174,000, WBC 8,500. Ferritin was 283.  During the interim, he has been "good." His teeth have been falling out on their own. He is still able to eat well. He denies mouth pain, nausea, vomiting, and diarrhea. His left 5th digit is stiff.  One pack of cigarettes lasts him 1.5 days. Per his wife, he smokes 2.5 packs per day.   Past Medical History:  Diagnosis Date  . Allergy   . Anemia   . Hypertension   . Pancreatitis   . Personal history of tobacco use, presenting hazards to health 03/25/2016  . Polycythemia   . Stroke [facility])    2008 9/11    History reviewed. No pertinent surgical history.  Family History  Problem Relation Age of Onset  . Diabetes Mother   . Heart failure  Mother   . Cancer Father        Throat   . Stroke Father   . Hypertension Father   . HIV Brother   . Thyroid disease Sister        thyroid cancer  . Hypertension Sister        both sisters    Social History:  reports that he has been smoking cigarettes. He has a 45.00 pack-year smoking history. He has never used smokeless tobacco. He reports previous alcohol use. He reports that he does not use drugs. He started smoking between the age of 46-18. He smoked 1 pack a day for 48 years. Now, one pack lasts for a day and a half.He drinks 2- 24 ounce beers/day. He was in [employer] for 3 years.He is a Furniture conservator/restorer. 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 He lives in [place]. The patient is alone today. He is accompanied by his oldest sister, Ian Conley.  Allergies:  Allergies  Allergen Reactions  . Penicillins Rash    Has patient had a PCN reaction causing immediate rash, facial/tongue/throat swelling, SOB or lightheadedness with hypotension: Yes Has patient had a PCN reaction causing severe rash involving mucus membranes or skin necrosis: Unknown Has patient had a PCN reaction that required hospitalization: No Has patient had a PCN reaction occurring within the last 10 years: No If all of the above answers are "NO", then may proceed with Cephalosporin use.    Current Medications: Current  Outpatient Medications  Medication Sig Dispense Refill  . amLODipine (NORVASC) 10 MG tablet Take 1 tablet (10 mg total) by mouth daily. 90 tablet 1  . atorvastatin (LIPITOR) 80 MG tablet TAKE 1 TABLET(80 MG) BY MOUTH DAILY AT 6 PM 90 tablet 1  . CLARITIN 10 MG CAPS Take by mouth daily.    Marland Kitchen dipyridamole-aspirin (AGGRENOX) 200-25 MG 12hr capsule Take 1 capsule by mouth 2 (two) times daily. 180 capsule 1  . donepezil (ARICEPT) 10 MG tablet Take 1 tablet by mouth daily.     Marland Kitchen LORazepam (ATIVAN) 0.5 MG tablet Take 1/2 to 1 tablet po BID prn agitation. 60 tablet 2  . memantine (NAMENDA) 5 MG tablet Take 5 mg by  mouth 2 (two) times daily.    . mirtazapine (REMERON) 7.5 MG tablet TAKE 1 TABLET(7.5 MG) BY MOUTH AT BEDTIME 90 tablet 1  . Multiple Vitamins-Minerals (YOUR LIFE MULTI MENS 50+) TABS Take by mouth.    . polyethylene glycol powder (GLYCOLAX/MIRALAX) powder as needed.   0   No current facility-administered medications for this visit.    Review of Systems  Constitutional: Positive for weight loss (1 lb). Negative for chills, diaphoresis, fever and malaise/fatigue.       Feels "good."  HENT: Negative for congestion, ear discharge, ear pain, hearing loss, nosebleeds, sinus pain, sore throat and tinnitus.        Teeth are falling out on their own  Eyes: Negative.  Negative for blurred vision, double vision, photophobia, pain, discharge and redness.  Respiratory: Negative.  Negative for cough, hemoptysis, sputum production, shortness of breath and wheezing.   Cardiovascular: Negative.  Negative for chest pain, palpitations, orthopnea, leg swelling and PND.  Gastrointestinal: Negative.  Negative for abdominal pain, blood in stool, constipation, diarrhea, heartburn, melena, nausea and vomiting.  Genitourinary: Negative.  Negative for dysuria, frequency, hematuria and urgency.  Musculoskeletal: Negative for back pain, falls, joint pain, myalgias and neck pain.       Left pinky finger stiffness  Skin: Negative.  Negative for itching and rash.  Neurological: Negative for dizziness, tingling, sensory change, seizures, weakness and headaches.       S/p CVA.  Endo/Heme/Allergies: Negative.  Does not bruise/bleed easily.  Psychiatric/Behavioral: Positive for memory loss. Negative for depression. The patient is not nervous/anxious and does not have insomnia.   All other systems reviewed and are negative.  Performance status (ECOG):  1  Vitals Blood pressure 121/65, pulse 75, temperature (!) 96.3 F (35.7 C), temperature source Tympanic, resp. rate 18, weight 136 lb 11 oz (62 kg), SpO2 98 %.    Physical Exam Vitals and nursing note reviewed.  Constitutional:      General: He is not in acute distress.    Appearance: He is well-developed. He is not diaphoretic.     Comments: Elderly gentleman sitting comfortably in exam room in no acute distress.  He has a cane by his side.  HENT:     Head: Normocephalic and atraumatic.     Comments: Wearing a golf cap.  Thick gray beard.    Mouth/Throat:     Mouth: Mucous membranes are moist.     Pharynx: Oropharynx is clear. No oropharyngeal exudate.     Comments: Poor dentition. Most of his upper teeth are gone. Eyes:     General: No scleral icterus.    Extraocular Movements: Extraocular movements intact.     Conjunctiva/sclera: Conjunctivae normal.     Pupils: Pupils are equal, round, and reactive to  light.     Comments: Brown eyes.  Neck:     Vascular: No JVD.  Cardiovascular:     Rate and Rhythm: Normal rate and regular rhythm.     Heart sounds: Normal heart sounds. No murmur heard.   Pulmonary:     Effort: Pulmonary effort is normal. No respiratory distress.     Breath sounds: Normal breath sounds. No wheezing or rales.  Chest:  Breasts:     Right: No supraclavicular adenopathy.     Left: No supraclavicular adenopathy.    Abdominal:     General: Bowel sounds are normal. There is no distension.     Palpations: Abdomen is soft. There is no mass.     Tenderness: There is no abdominal tenderness. There is no guarding or rebound.  Musculoskeletal:        General: Normal range of motion.     Cervical back: Normal range of motion and neck supple.  Lymphadenopathy:     Head:     Right side of head: No preauricular, posterior auricular or occipital adenopathy.     Left side of head: No preauricular, posterior auricular or occipital adenopathy.     Cervical: No cervical adenopathy.     Upper Body:     Right upper body: No supraclavicular adenopathy.     Left upper body: No supraclavicular adenopathy.     Lower Body: No right  inguinal adenopathy. No left inguinal adenopathy.  Skin:    General: Skin is warm and dry.     Coloration: Skin is not pale.     Findings: No erythema or rash.  Neurological:     Mental Status: He is alert and oriented to person, place, and time.  Psychiatric:        Behavior: Behavior normal.        Thought Content: Thought content normal.        Judgment: Judgment normal.    Appointment on 02/12/2021  Component Date Value Ref Range Status  . Sed Rate 02/12/2021 13  0 - 20 mm/hr Final   Performed at [facility], [address], [place], [place] [address]  . WBC 02/12/2021 4.9  4.0 - 10.5 K/uL Final  . RBC 02/12/2021 4.73  4.22 - 5.81 MIL/uL Final  . Hemoglobin 02/12/2021 14.0  13.0 - 17.0 g/dL Final  . HCT 02/12/2021 42.7  39.0 - 52.0 % Final  . MCV 02/12/2021 90.3  80.0 - 100.0 fL Final  . MCH 02/12/2021 29.6  26.0 - 34.0 pg Final  . MCHC 02/12/2021 32.8  30.0 - 36.0 g/dL Final  . RDW 02/12/2021 14.1  11.5 - 15.5 % Final  . Platelets 02/12/2021 157  150 - 400 K/uL Final  . nRBC 02/12/2021 0.0  0.0 - 0.2 % Final  . Neutrophils Relative % 02/12/2021 71  % Final  . Neutro Abs 02/12/2021 3.5  1.7 - 7.7 K/uL Final  . Lymphocytes Relative 02/12/2021 20  % Final  . Lymphs Abs 02/12/2021 1.0  0.7 - 4.0 K/uL Final  . Monocytes Relative 02/12/2021 9  % Final  . Monocytes Absolute 02/12/2021 0.4  0.1 - 1.0 K/uL Final  . Eosinophils Relative 02/12/2021 0  % Final  . Eosinophils Absolute 02/12/2021 0.0  0.0 - 0.5 K/uL Final  . Basophils Relative 02/12/2021 0  % Final  . Basophils Absolute 02/12/2021 0.0  0.0 - 0.1 K/uL Final  . Immature Granulocytes 02/12/2021 0  % Final  . Abs Immature Granulocytes  02/12/2021 0.01  0.00 - 0.07 K/uL Final   Performed at [facility], [address] [place], [place] [address]    Assessment:  Ian Conley is a 70 y.o. male with secondary polycythemiafirst noted on 03/10/2016. He has a 48 pack year smokinghistory. He  denies any cardiac disease, sleep apnea, or testosterone use. He had a CVAon 09/01/2007 and 07/13/2018.  Work-upon 03/19/2016 revealed a hematocrit of 57.3, hemoglobin 19.5, MCV 91.5, platelets 144,000, and WBC 5000. Erythropoietin level was 3.0 (2.6-18.5). Repeat epo level was 6.8 on 05/05/2016. JAK2 was negative for V617F and exon 12. Ferritin was 181. Iron studies revealed a saturation of 41% and a TIBC of 333.  Low dose spiral chest CTscan on 03/25/2016 was negative. There was notation of a thickened area in the distal stomach or pyloric region.  Chest and abdomen CT on 08/09/2018 revealed moderate to advanced centrilobular and paraseptal emphysema. There was biapical and bibasilar scarring. There was scattered aortic atherosclerosis. No aneurysm. There was no acute cardiopulmonary disease. There was mild fatty infiltration of the liver. There was no gastric abnormality by CT. There was no acute findings in the abdomen.  Low dose chest CT on 08/30/2019 revealed lung-RADS 2S, benign appearance or behavior. There was aortic atherosclerosis, in addition to left main and right coronary artery disease. There was mild diffuse bronchial wall thickening with moderate centrilobular and paraseptal emphysema; imaging findings suggestive of underlying COPD.  He has an elevated alkaline phosphatase (139). PSA was 0.4 and 0.5 on 01/15/2020. He has never had a colonoscopy.  He has a history of an elevated iron saturation and ferritin: Iron saturation was 41% on 03/19/2016 and 50% on 02/12/2020. Ferritin was 224 on 07/15/2017, 128 on 01/17/2018, 1107 on 07/21/2018, 391 on 08/14/2019, and 312 on 02/12/2020.  He undergoes small volume phlebotomy(250-300 cc) if his hematocrit is >52. Last phlebotomy was 08/06/2016.  Symptomatically, he feels "good." His teeth have been falling out. He is still able to eat well.  Per his wife, he smokes 2.5 packs per day.  Exam is stable.  Plan: 1.    Labs today: CBC with diff, ferritin, iron studies, sed rate. 2.   Secondary polycythemia, resolved       Hematocrit 42.7. Hemoglobin 14.0 on 02/12/2021.  He continues to smoke 2.5 packs/day per his wife.         Encourage smoking cessation.       No phlebotomy today.         Review concern for declining hemoglobin and continued smoking without need for phlebotomy.                   Concern for possible blood loss.                   Patient denies any bleeding.                   Ferritin 314 with an iron saturation of 40 % and a TIBC 216 today.                   Patient states that he was unable to do the colonoscopy prep.   Contact Dr. [name]. 3. Weight loss             Patient has lost 1 pound.  Dentition remains problematic.  Concern for occult malignancy given spontaneous resolution of secondary erythrocytosis. 4.   Elevated iron saturation  Iron saturation 40%.  Ferritin previously 128-306.  Ferritin 1107 on 07/21/2018, 391 on 08/14/2019, and 312 on 02/12/2020.  Ferritin 14 on 02/12/2021.   He has had no intervening phlebotomies.  Elevated ferritin is felt secondary to an acute phase reactant.   However sed rate 13  (normal) today.  Hemochromatosis testing was normal on 02/19/2020. 5.   RTC in 6 months for MD assess and labs (CBC with diff, ferritin, iron studies, sed rate).  I discussed the assessment and treatment plan with the patient.  The patient was provided an opportunity to ask questions and all were answered.  The patient agreed with the plan and demonstrated an understanding of the instructions.  The patient was advised to call back if the symptoms worsen or if the condition fails to improve as anticipated.  I provided 17 minutes of face-to-face time during this this encounter and > 50% was spent counseling as documented under my assessment and plan.  An additional 6 minutes were spent reviewing his chart (Epic and Care Everywhere) including notes, labs, and imaging  studies.    Lequita Asal, MD, PhD    02/12/2021, 2:18 PM  I, Mirian Mo Tufford, am acting as a Education administrator for [employer]. Mike Gip, MD.   I, Ryker [place] C. Mike Gip, MD, have reviewed the above documentation for accuracy and completeness, and I agree with the above.

## 2021-02-12 ENCOUNTER — Other Ambulatory Visit: Payer: Self-pay

## 2021-02-12 ENCOUNTER — Inpatient Hospital Stay: Payer: Medicare Other | Attending: Hematology and Oncology | Admitting: Hematology and Oncology

## 2021-02-12 ENCOUNTER — Telehealth: Payer: Self-pay

## 2021-02-12 ENCOUNTER — Encounter: Payer: Self-pay | Admitting: Hematology and Oncology

## 2021-02-12 ENCOUNTER — Inpatient Hospital Stay: Payer: Medicare Other

## 2021-02-12 ENCOUNTER — Telehealth: Payer: Self-pay | Admitting: Gastroenterology

## 2021-02-12 VITALS — BP 121/65 | HR 75 | Temp 96.3°F | Resp 18 | Wt 136.7 lb

## 2021-02-12 DIAGNOSIS — R7989 Other specified abnormal findings of blood chemistry: Secondary | ICD-10-CM | POA: Insufficient documentation

## 2021-02-12 DIAGNOSIS — Z7982 Long term (current) use of aspirin: Secondary | ICD-10-CM | POA: Insufficient documentation

## 2021-02-12 DIAGNOSIS — D751 Secondary polycythemia: Secondary | ICD-10-CM | POA: Diagnosis not present

## 2021-02-12 DIAGNOSIS — I2510 Atherosclerotic heart disease of native coronary artery without angina pectoris: Secondary | ICD-10-CM | POA: Diagnosis not present

## 2021-02-12 DIAGNOSIS — Z833 Family history of diabetes mellitus: Secondary | ICD-10-CM | POA: Insufficient documentation

## 2021-02-12 DIAGNOSIS — Z79899 Other long term (current) drug therapy: Secondary | ICD-10-CM | POA: Insufficient documentation

## 2021-02-12 DIAGNOSIS — F17210 Nicotine dependence, cigarettes, uncomplicated: Secondary | ICD-10-CM | POA: Diagnosis not present

## 2021-02-12 DIAGNOSIS — Z8249 Family history of ischemic heart disease and other diseases of the circulatory system: Secondary | ICD-10-CM | POA: Diagnosis not present

## 2021-02-12 DIAGNOSIS — Z8673 Personal history of transient ischemic attack (TIA), and cerebral infarction without residual deficits: Secondary | ICD-10-CM | POA: Diagnosis not present

## 2021-02-12 DIAGNOSIS — D696 Thrombocytopenia, unspecified: Secondary | ICD-10-CM

## 2021-02-12 DIAGNOSIS — Z8349 Family history of other endocrine, nutritional and metabolic diseases: Secondary | ICD-10-CM | POA: Insufficient documentation

## 2021-02-12 DIAGNOSIS — R748 Abnormal levels of other serum enzymes: Secondary | ICD-10-CM

## 2021-02-12 DIAGNOSIS — I10 Essential (primary) hypertension: Secondary | ICD-10-CM | POA: Diagnosis not present

## 2021-02-12 DIAGNOSIS — R634 Abnormal weight loss: Secondary | ICD-10-CM | POA: Diagnosis not present

## 2021-02-12 LAB — CBC WITH DIFFERENTIAL/PLATELET
Abs Immature Granulocytes: 0.01 10*3/uL (ref 0.00–0.07)
Basophils Absolute: 0 10*3/uL (ref 0.0–0.1)
Basophils Relative: 0 %
Eosinophils Absolute: 0 10*3/uL (ref 0.0–0.5)
Eosinophils Relative: 0 %
HCT: 42.7 % (ref 39.0–52.0)
Hemoglobin: 14 g/dL (ref 13.0–17.0)
Immature Granulocytes: 0 %
Lymphocytes Relative: 20 %
Lymphs Abs: 1 10*3/uL (ref 0.7–4.0)
MCH: 29.6 pg (ref 26.0–34.0)
MCHC: 32.8 g/dL (ref 30.0–36.0)
MCV: 90.3 fL (ref 80.0–100.0)
Monocytes Absolute: 0.4 10*3/uL (ref 0.1–1.0)
Monocytes Relative: 9 %
Neutro Abs: 3.5 10*3/uL (ref 1.7–7.7)
Neutrophils Relative %: 71 %
Platelets: 157 10*3/uL (ref 150–400)
RBC: 4.73 MIL/uL (ref 4.22–5.81)
RDW: 14.1 % (ref 11.5–15.5)
WBC: 4.9 10*3/uL (ref 4.0–10.5)
nRBC: 0 % (ref 0.0–0.2)

## 2021-02-12 LAB — SEDIMENTATION RATE: Sed Rate: 13 mm/hr (ref 0–20)

## 2021-02-12 LAB — FERRITIN: Ferritin: 314 ng/mL (ref 24–336)

## 2021-02-12 LAB — IRON AND TIBC
Iron: 87 ug/dL (ref 45–182)
Saturation Ratios: 40 % — ABNORMAL HIGH (ref 17.9–39.5)
TIBC: 216 ug/dL — ABNORMAL LOW (ref 250–450)
UIBC: 129 ug/dL

## 2021-02-12 NOTE — Telephone Encounter (Signed)
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 Please call her at [phone].

## 2021-02-12 NOTE — Progress Notes (Signed)
Left hand numbness occasionally

## 2021-02-12 NOTE — Telephone Encounter (Signed)
Faxed cologuard 

## 2021-02-13 NOTE — Telephone Encounter (Signed)
Called patient's sister-Shirley to let her know that I had called his pharmacy to ask if the Sutabs were able to be crushed. However, I left a voicemail with the pharmacy hoping they will call me back.

## 2021-02-18 NOTE — Telephone Encounter (Signed)
Called patient's pharmacist and asked if the Sutabs were able to be crushed and he stated that they are not able to. Therefore, I called patient's sister-Shirley and she stated that I needed to notify Dr. [name] and Dr. Mike Gip that he will not do the colonoscopy. I suggested for him to try a liquid form again and she stated that he will NOT try it again. Please advise if you ladies would like to do something else. Thank you.

## 2021-02-18 NOTE — Telephone Encounter (Signed)
  I do not know the other prep options.  M

## 2021-02-20 NOTE — Telephone Encounter (Signed)
I called patient's sister-Shirley and she stated that he will not try any type of liquids because he vomits. I told her that if he tries to take the medicine, it should work out. However, she insisted that he would not. Please advise.

## 2021-02-21 NOTE — Telephone Encounter (Signed)
Can you please call patient's sister-Shirley and let her know that she will need to discuss further about what could be done with her brother. Can you please schedule a virtual visit with Dr. [name]. Thank you.

## 2021-02-24 NOTE — Telephone Encounter (Signed)
Appointment had been scheduled to be on 02/26/2021.

## 2021-02-26 ENCOUNTER — Encounter: Payer: Self-pay | Admitting: Gastroenterology

## 2021-02-26 ENCOUNTER — Telehealth (INDEPENDENT_AMBULATORY_CARE_PROVIDER_SITE_OTHER): Payer: Medicare Other | Admitting: Gastroenterology

## 2021-02-26 DIAGNOSIS — R112 Nausea with vomiting, unspecified: Secondary | ICD-10-CM

## 2021-02-27 NOTE — Progress Notes (Signed)
Ian Antigua, MD [address]  [place]  [place], [place] [address]  Main: [phone]  Fax: [phone]   Primary Care Physician: Ian Guise, MD  Virtual Visit via Telephone Note  I connected with patient on 02/27/21 at  2:00 PM EST by telephone and verified that I am speaking with the correct person using two identifiers.   I discussed the limitations, risks, security and privacy concerns of performing an evaluation and management service by telephone and the availability of in person appointments. I also discussed with the patient that there may be a patient responsible charge related to this service. The patient expressed understanding and agreed to proceed.  Location of Patient: Home Location of Provider: Home Persons involved: Patient and provider only during the visit (nursing staff and front desk staff was involved in communicating with the patient prior to the appointment, reviewing medications and checking them in)   History of Present Illness: Chief Complaint  Patient presents with  . Discuss scheduling colonoscopy     HPI: Ian Conley is a 70 y.o. male who has been referred multiple times for screening colonoscopy but unable to tolerate prep, here to discuss further steps along with his sister.  The last prep that was ordered was 2 tabs to allow for nonliquid prep and better tolerance.  However, patient sister states even after taking 1 pill he did not want to take anymore.  He has never had a liquid prep for colonoscopy but sister states when he had oral contrast for his CT scan he did not do well with it.  No blood in stool, nausea vomiting or abdominal pain.  Current Outpatient Medications  Medication Sig Dispense Refill  . amLODipine (NORVASC) 10 MG tablet Take 1 tablet (10 mg total) by mouth daily. 90 tablet 1  . atorvastatin (LIPITOR) 80 MG tablet TAKE 1 TABLET(80 MG) BY MOUTH DAILY AT 6 PM 90 tablet 1  . CLARITIN 10 MG CAPS Take by mouth  daily.    Marland Kitchen dipyridamole-aspirin (AGGRENOX) 200-25 MG 12hr capsule Take 1 capsule by mouth 2 (two) times daily. 180 capsule 1  . donepezil (ARICEPT) 10 MG tablet Take 1 tablet by mouth daily.     Marland Kitchen LORazepam (ATIVAN) 0.5 MG tablet Take 1/2 to 1 tablet po BID prn agitation. 60 tablet 2  . memantine (NAMENDA) 10 MG tablet Take 10 mg by mouth 2 (two) times daily.    . mirtazapine (REMERON) 7.5 MG tablet TAKE 1 TABLET(7.5 MG) BY MOUTH AT BEDTIME 90 tablet 1  . Multiple Vitamins-Minerals (YOUR LIFE MULTI MENS 50+) TABS Take by mouth.    . polyethylene glycol powder (GLYCOLAX/MIRALAX) powder as needed.   0   No current facility-administered medications for this visit.    Allergies as of 02/26/2021 - Review Complete 02/26/2021  Allergen Reaction Noted  . Penicillins Rash 03/19/2016    Review of Systems:    All systems reviewed and negative except where noted in HPI.   Observations/Objective:  Labs: CMP     Component Value Date/Time   NA 144 01/22/2021 1118   K 3.9 01/22/2021 1118   CL 107 (H) 01/22/2021 1118   CO2 23 01/22/2021 1118   GLUCOSE 82 01/22/2021 1118   GLUCOSE 110 (H) 02/12/2020 1017   BUN 4 (L) 01/22/2021 1118   CREATININE 1.00 01/22/2021 1118   CALCIUM 9.1 01/22/2021 1118   PROT 6.8 01/22/2021 1118   ALBUMIN 4.3 01/22/2021 1118   AST 19 01/22/2021 1118  ALT 18 01/22/2021 1118   ALKPHOS 132 (H) 01/22/2021 1118   BILITOT 0.2 01/22/2021 1118   GFRNONAA 77 01/22/2021 1118   GFRAA 89 01/22/2021 1118   Lab Results  Component Value Date   WBC 4.9 02/12/2021   HGB 14.0 02/12/2021   HCT 42.7 02/12/2021   MCV 90.3 02/12/2021   PLT 157 02/12/2021    Imaging Studies: No results found.  Assessment and Plan:   Ian Conley is a 70 y.o. y/o male here to discuss measures to complete colonoscopy prep for screening colonoscopy  Assessment and Plan: We discussed with the patient and his sister that the oral contrast given during the CT abdomen is different than  oral liquid preps used for colonoscopy and patient may be able to tolerate these better  Patient is willing to try one of the liquid preps to see if he is able to complete it  Patient will be given the option of coming and picking up Clenpiq sample prep in our office and reattempt colonoscopy prep and colonoscopy for screening  Follow Up Instructions:    I discussed the assessment and treatment plan with the patient. The patient was provided an opportunity to ask questions and all were answered. The patient agreed with the plan and demonstrated an understanding of the instructions.   The patient was advised to call back or seek an in-person evaluation if the symptoms worsen or if the condition fails to improve as anticipated.  I provided 15 minutes of non-face-to-face time during this encounter. Additional time was spent in reviewing patient's chart, placing orders etc.   Virgel Manifold, MD  Speech recognition software was used to dictate this note.

## 2021-02-27 NOTE — Addendum Note (Signed)
Addended by: [name] on: 02/27/2021 12:02 PM   Modules accepted: Level of Service

## 2021-02-28 ENCOUNTER — Other Ambulatory Visit: Payer: Self-pay | Admitting: Nurse Practitioner

## 2021-03-03 ENCOUNTER — Other Ambulatory Visit: Payer: Self-pay

## 2021-03-03 ENCOUNTER — Telehealth: Payer: Self-pay

## 2021-03-03 DIAGNOSIS — F321 Major depressive disorder, single episode, moderate: Secondary | ICD-10-CM

## 2021-03-03 MED ORDER — ATORVASTATIN CALCIUM 80 MG PO TABS: ORAL_TABLET | ORAL | 1 refills | Status: DC

## 2021-03-03 MED ORDER — ASPIRIN-DIPYRIDAMOLE ER 25-200 MG PO CP12: 1.0000 | ORAL_CAPSULE | Freq: Two times a day (BID) | ORAL | 1 refills | Status: DC

## 2021-03-03 MED ORDER — MIRTAZAPINE 7.5 MG PO TABS: ORAL_TABLET | ORAL | 1 refills | Status: DC

## 2021-03-03 NOTE — Telephone Encounter (Signed)
Yes 90 with 1 refill

## 2021-03-14 ENCOUNTER — Telehealth: Payer: Self-pay | Admitting: Internal Medicine

## 2021-03-14 NOTE — Progress Notes (Signed)
  Chronic Care Management   Note  03/14/2021 Name: DOUGLASS DUNSHEE MRN: [id number] DOB: 1951/11/24  FARAH BENISH is a 70 y.o. year old male who is a primary care patient of Lavera Guise, MD. I reached out to Danna Hefty by phone today in response to a referral sent by Mr. Alver Leete Treadway's PCP, Lavera Guise, MD.   Mr. Matich was given information about Chronic Care Management services today including:  1. CCM service includes personalized support from designated clinical staff supervised by his physician, including individualized plan of care and coordination with other care providers 2. 24/7 contact phone numbers for assistance for urgent and routine care needs. 3. Service will only be billed when office clinical staff spend 20 minutes or more in a month to coordinate care. 4. Only one practitioner may furnish and bill the service in a calendar month. 5. The patient may stop CCM services at any time (effective at the end of the month) by phone call to the office staff.   Patient agreed to services and verbal consent obtained.   Follow up plan:   Carley Perdue [facility] Scheduler

## 2021-03-24 NOTE — Progress Notes (Signed)
Chronic Care Management Pharmacy Note  03/27/2021 Name:  WARD BOISSONNEAULT MRN:  [id number] DOB:  03-Dec-1951  Subjective: Ian Conley is an 70 y.o. year old male who is a primary patient of Humphrey Rolls Timoteo Gaul, MD.  The CCM team was consulted for assistance with disease management and care coordination needs.    Engaged with patient by telephone for initial visit in response to provider referral for pharmacy case management and/or care coordination services.   Consent to Services:  The patient was given the following information about Chronic Care Management services today, agreed to services, and gave verbal consent: 1. CCM service includes personalized support from designated clinical staff supervised by the primary care provider, including individualized plan of care and coordination with other care providers 2. 24/7 contact phone numbers for assistance for urgent and routine care needs. 3. Service will only be billed when office clinical staff spend 20 minutes or more in a month to coordinate care. 4. Only one practitioner may furnish and bill the service in a calendar month. 5.The patient may stop CCM services at any time (effective at the end of the month) by phone call to the office staff. 6. The patient will be responsible for cost sharing (co-pay) of up to 20% of the service fee (after annual deductible is met). Patient agreed to services and consent obtained.  Patient Care Team: Lavera Guise, MD as PCP - General (Internal Medicine) Ronnell Freshwater, NP as Referring Physician (Family Medicine) Bary Castilla Forest Gleason, MD as Consulting Physician (General Surgery) Virgel Manifold, MD as Consulting Physician (Gastroenterology) Lequita Asal, MD as Referring Physician (Hematology and Oncology) Edythe Clarity, [facility] as Pharmacist (Pharmacist)  Recent office visits: 01/21/21 Humphrey Rolls) -  No changes to meds, consider d/c to amlodipine if BP low in the future  Recent consult  visits: 02/12/21 Mike Gip) - No medication changes made at this visit   01/23/21 Prudence Davidson, podiatry) - foot care, nails debrided with no concerns.  RTC in 3 months  Hospital visits: None in previous 6 months  Objective:  Lab Results  Component Value Date   CREATININE 1.00 01/22/2021   BUN 4 (L) 01/22/2021   GFRNONAA 77 01/22/2021   GFRAA 89 01/22/2021   NA 144 01/22/2021   K 3.9 01/22/2021   CALCIUM 9.1 01/22/2021   CO2 23 01/22/2021   GLUCOSE 82 01/22/2021    Lab Results  Component Value Date/Time   HGBA1C 6.2 (H) 07/15/2018 04:52 AM    Last diabetic Eye exam: No results found for: HMDIABEYEEXA  Last diabetic Foot exam: No results found for: HMDIABFOOTEX   Lab Results  Component Value Date   CHOL 96 (L) 01/22/2021   HDL 44 01/22/2021   LDLCALC 35 01/22/2021   TRIG 82 01/22/2021   CHOLHDL 2.4 01/15/2020    Hepatic Function Latest Ref Rng & Units 01/22/2021 02/12/2020 01/15/2020  Total Protein 6.0 - 8.5 g/dL 6.8 7.3 6.3  Albumin 3.8 - 4.8 g/dL 4.3 4.0 4.1  AST 0 - 40 IU/L 19 29 19   ALT 0 - 44 IU/L 18 29 27   Alk Phosphatase 44 - 121 IU/L 132(H) 122 146(H)  Total Bilirubin 0.0 - 1.2 mg/dL 0.2 0.6 0.2    Lab Results  Component Value Date/Time   TSH 0.857 01/22/2021 11:18 AM   TSH 0.334 (L) 01/15/2020 03:21 PM   FREET4 1.32 01/22/2021 11:18 AM   FREET4 1.17 01/15/2020 03:21 PM    CBC Latest Ref Rng & Units 02/12/2021  01/22/2021 11/11/2020  WBC 4.0 - 10.5 K/uL 4.9 7.6 8.5  Hemoglobin 13.0 - 17.0 g/dL 14.0 13.4 14.0  Hematocrit 39.0 - 52.0 % 42.7 38.9 42.7  Platelets 150 - 400 K/uL 157 156 174    No results found for: VD25OH  Clinical ASCVD: Yes  The ASCVD Risk score Mikey Bussing DC Jr., et al., 2013) failed to calculate for the following reasons:   The patient has a prior MI or stroke diagnosis    Depression screen [facility] 2/9 01/21/2021 10/28/2020 07/17/2020  Decreased Interest 0 0 0  Down, Depressed, Hopeless 0 0 0  PHQ - 2 Score 0 0 0  Altered sleeping - - -  Tired,  decreased energy - - -  Change in appetite - - -  Feeling bad or failure about yourself  - - -  Trouble concentrating - - -  Moving slowly or fidgety/restless - - -  Suicidal thoughts - - -  PHQ-9 Score - - -      Social History   Tobacco Use  Smoking Status Current Every Day Smoker  . Packs/day: 1.00  . Years: 45.00  . Pack years: 45.00  . Types: Cigarettes  Smokeless Tobacco Never Used   BP Readings from Last 3 Encounters:  02/12/21 121/65  01/21/21 (!) 120/56  10/28/20 (!) 108/58   Pulse Readings from Last 3 Encounters:  02/12/21 75  01/21/21 88  10/28/20 75   Wt Readings from Last 3 Encounters:  02/12/21 136 lb 11 oz (62 kg)  01/21/21 144 lb (65.3 kg)  10/28/20 137 lb 12.8 oz (62.5 kg)   BMI Readings from Last 3 Encounters:  02/12/21 19.06 kg/m  01/21/21 20.08 kg/m  10/28/20 19.49 kg/m    Assessment/Interventions: Review of patient past medical history, allergies, medications, health status, including review of consultants reports, laboratory and other test data, was performed as part of comprehensive evaluation and provision of chronic care management services.   SDOH:  (Social Determinants of Health) assessments and interventions performed: Yes   Financial Resource Strain: Not on file    SDOH Screenings   Alcohol Screen: Low Risk   . Last Alcohol Screening Score (AUDIT): 2  Depression (PHQ2-9): Low Risk   . PHQ-2 Score: 0  Financial Resource Strain: Not on file  Food Insecurity: Not on file  Housing: Not on file  Physical Activity: Not on file  Social Connections: Not on file  Stress: Not on file  Tobacco Use: High Risk  . Smoking Tobacco Use: Current Every Day Smoker  . Smokeless Tobacco Use: Never Used  Transportation Needs: Not on file    CCM Care Plan  Allergies  Allergen Reactions  . Penicillins Rash    Has patient had a PCN reaction causing immediate rash, facial/tongue/throat swelling, SOB or lightheadedness with hypotension:  Yes Has patient had a PCN reaction causing severe rash involving mucus membranes or skin necrosis: Unknown Has patient had a PCN reaction that required hospitalization: No Has patient had a PCN reaction occurring within the last 10 years: No If all of the above answers are "NO", then may proceed with Cephalosporin use.    Medications Reviewed Today    Reviewed by Edythe Clarity, [facility] (Pharmacist) on 03/27/21 at [phone]  Med List Status: <None>  Medication Order Taking? Sig Documenting Provider Last Dose Status Informant  amLODipine (NORVASC) 10 MG tablet [id number] Yes Take 1 tablet (10 mg total) by mouth daily. Lavera Guise, MD Taking Active  Med Note Woodroe Chen Feb 12, 2021  1:39 PM) Patient taking 1/2 tab  atorvastatin (LIPITOR) 80 MG tablet [id number] Yes TAKE 1 TABLET(80 MG) BY MOUTH DAILY AT 6 PM Lavera Guise, MD Taking Active   CLARITIN 10 MG CAPS [id number] Yes Take by mouth daily. [provider] Taking Active   dipyridamole-aspirin (AGGRENOX) 200-25 MG 12hr capsule [id number] Yes Take 1 capsule by mouth 2 (two) times daily. Lavera Guise, MD Taking Active   donepezil (ARICEPT) 10 MG tablet [id number] Yes Take 1 tablet by mouth daily.  [provider] Taking Active   LORazepam (ATIVAN) 0.5 MG tablet [id number] Yes Take 1/2 to 1 tablet po BID prn agitation. Lavera Guise, MD Taking Active   memantine [facility]) 10 MG tablet [id number] Yes Take 10 mg by mouth 2 (two) times daily. [provider] Taking Active   mirtazapine (REMERON) 7.5 MG tablet [id number] Yes TAKE 1 TABLET(7.5 MG) BY MOUTH AT BEDTIME Lavera Guise, MD Taking Active   Multiple Vitamins-Minerals (YOUR LIFE MULTI MENS 50+) TABS [id number] Yes Take by mouth. [provider] Taking Active   polyethylene glycol powder (GLYCOLAX/MIRALAX) powder [id number] Yes as needed.  [provider] Taking Active           Patient Active Problem List   Diagnosis Date Noted   . Pain due to onychomycosis of toenails of both feet 01/23/2021  . Pre-ulcerative calluses 01/23/2021  . Abnormal iron saturation 08/12/2020  . Hemiplegia affecting dominant side, post-stroke ([place]) 10/24/2019  . Thrombocytopenia, unspecified ([place]) 10/24/2019  . Flu vaccine need 10/24/2019  . Need for vaccination against Streptococcus pneumoniae using pneumococcal conjugate vaccine 7 10/24/2019  . Encounter for long-term (current) use of medications 10/24/2019  . Screening for colon cancer 10/24/2019  . Chronic pulmonary emphysema not affecting current episode of care ([place]) 10/24/2019  . Weight loss 08/14/2019  . Numbness and tingling in left arm 06/26/2019  . Dementia, multiinfarct, with behavioral disturbance ([place]) 01/23/2019  . History of stroke 09/27/2018  . Loss of memory 09/27/2018  . Episode of moderate major depression ([place]) 08/18/2018  . Onychomycosis of toenail 08/18/2018  . Urinary tract infection without hematuria 08/18/2018  . Dysuria 08/18/2018  . Abnormality of esophagus 08/08/2018  . Cerebrovascular accident (CVA) ([place]) 07/15/2018  . Protein-calorie malnutrition, severe 07/14/2018  . Altered mental status 07/13/2018  . Sebaceous cyst 05/17/2018  . Essential hypertension 05/04/2018  . Acute upper respiratory infection 05/04/2018  . Primary osteoarthritis of right hip 05/04/2018  . Cerebrovascular disease 05/04/2018  . Localized superficial swelling, mass, or lump 05/04/2018  . Encounter for general adult medical examination with abnormal findings 05/04/2018  . Secondary erythrocytosis 09/05/2016  . Personal history of tobacco use, presenting hazards to health 03/25/2016  . Gastric wall thickening 03/25/2016  . Alkaline phosphatase elevation 03/10/2016    Immunization History  Administered Date(s) Administered  . Influenza Inj Mdck Quad Pf 09/30/2018, 10/23/2019, 10/28/2020  . Moderna Sars-Covid-2 Vaccination 01/22/2020, 02/29/2020  . Pneumococcal Conjugate-13  10/25/2020  . Tdap 07/18/2018    Conditions to be addressed/monitored:  HTN, Hx of CVA/Stroke, Dementia.  Care Plan : General Pharmacy (Adult)  Updates made by Edythe Clarity, RPH since 03/27/2021 12:00 AM    Problem: HTN, Hx of Stroke, Dementia   Priority: High  Onset Date: 03/26/2021    Long-Range Goal: Patient-Specific Goal   Start Date: 03/26/2021  Expected End Date: 09/26/2021  This Visit's Progress: On track  Priority: High  Note:  Current Barriers:  . Unable to independently monitor therapeutic efficacy . Unable to self administer medications as prescribed  Pharmacist Clinical Goal(s):  Marland Kitchen Patient will maintain control of blood pressure as evidenced by home monitoring/possible RPM.  . achieve ability to self administer medications as prescribed through use of pill packs as evidenced by patient report . contact provider office for questions/concerns as evidenced notation of same in electronic health record through collaboration with PharmD and provider.   Interventions: . 1:1 collaboration with Lavera Guise, MD regarding development and update of comprehensive plan of care as evidenced by provider attestation and co-signature . Inter-disciplinary care team collaboration (see longitudinal plan of care) . Comprehensive medication review performed; medication list updated in electronic medical record  Hypertension (BP goal <140/90) -Controlled, based on recent office readings -Current treatment: . Amlodipine 74m daily -Medications previously tried: benazepril, Toprol XL  -Current home readings: patient's sister went home and texted me that his BP was 148/73 that day, no other home readings available -Current exercise habits: minimal -Denies hypotensive/hypertensive symptoms -Educated on BP goals and benefits of medications for prevention of heart attack, stroke and kidney damage; Daily salt intake goal < 2300 mg; Importance of home blood pressure monitoring; -Counseled  to monitor BP at home daily, document, and provide log at future appointments -Patient may be good candidate for RPM for blood pressure monitoring as his dose of amlodipine was recently decreased.  His sister usually checks it at least every other day, however is interested in u[place]ore[place]ing able to see the results real time. -If BP remains elevated >140/90 patient may benefit from increasing back to 120mdose. -He denies any swelling at this time. -Recommended to continue current medication Recommended follow BP closely for dose titration if needed.   Hx of Stroke/CVA (Goal: Minimize risk) -Controlled -Current treatment  . Atorvastatin 8085maily . Aggrenox 200-42m81mice daily -Medications previously tried: none noted -LDL well controlled, patient takes appropriately  -Recommended to continue current medication  Dementia (Goal: Slow progression/reduce symptom burden) -Controlled -Current treatment  . Donepezil 10mg75mly . Memantine 10mg 72m-Medications previously tried: none noted -Reports symptoms are stable -Sister is in charge of medication administration and care for Mr. CarterCunyorganizes his medications and checks his BP often -He denies any adverse effects from these medications including GI symptoms  -Recommended to continue current medication   Patient Goals/Self-Care Activities . Patient will:  - take medications as prescribed check blood pressure work towards synchronization for packaging of his medications., document, and provide at future appointments Consider RPM for blood pressure.  Follow Up Plan: The care management team will reach out to the patient again over the next 120 days.       Medication Assistance: None required.  Patient affirms current coverage meets needs.  Patient's [facility] is:  [facility]: [phone] [place] [address] [place] [address]: [phone] pill box? No - Sister organizes in baggies every few days. Pt endorses 100% compliance  We discussed: Benefits of medication synchronization, packaging and delivery as well as enhanced pharmacist oversight with [facility]. Patient decided to: Utilize [facility] pharmacy for medication synchronization, packaging and delivery  Care Plan and Follow Up Patient Decision:  Patient agrees to Care Plan and Follow-up.  Plan: The care management team will reach out to the patient again over the next 120 days.  Verbal consent obtained for [facility] enhanced pharmacy services (medication synchronization, adherence packaging, delivery coordination). A medication sync plan was created to allow patient to get all medications delivered once every 30 to 90 days per patient preference. Patient understands they have freedom to choose pharmacy and clinical pharmacist will coordinate care between all prescribers and [facility].   Beverly Milch, PharmD Clinical Pharmacist [facility] [phone]

## 2021-03-25 ENCOUNTER — Telehealth: Payer: Self-pay | Admitting: Pharmacist

## 2021-03-25 NOTE — Progress Notes (Addendum)
    Chronic Care Management Pharmacy Assistant   Name: Ian Conley  MRN: [id number] DOB: 1951-04-29  Reason for Encounter: Initial Questions  Medications: Outpatient Encounter Medications as of 03/25/2021  Medication Sig Note   amLODipine (NORVASC) 10 MG tablet Take 1 tablet (10 mg total) by mouth daily. 02/12/2021: Patient taking 1/2 tab   atorvastatin (LIPITOR) 80 MG tablet TAKE 1 TABLET(80 MG) BY MOUTH DAILY AT 6 PM    CLARITIN 10 MG CAPS Take by mouth daily.    dipyridamole-aspirin (AGGRENOX) 200-25 MG 12hr capsule Take 1 capsule by mouth 2 (two) times daily.    donepezil (ARICEPT) 10 MG tablet Take 1 tablet by mouth daily.     LORazepam (ATIVAN) 0.5 MG tablet Take 1/2 to 1 tablet po BID prn agitation.    memantine (NAMENDA) 10 MG tablet Take 10 mg by mouth 2 (two) times daily.    mirtazapine (REMERON) 7.5 MG tablet TAKE 1 TABLET(7.5 MG) BY MOUTH AT BEDTIME    Multiple Vitamins-Minerals (YOUR LIFE MULTI MENS 50+) TABS Take by mouth.    polyethylene glycol powder (GLYCOLAX/MIRALAX) powder as needed.     No facility-administered encounter medications on file as of 03/25/2021.    Have you seen any other providers since your last visit? Patients sister stated no.  Any changes in your medications or health? Patients sister stated no.  Any side effects from any medications? Patients sister stated no.  Do you have an symptoms or problems not managed by your medications? Patients sister stated no.  Any concerns about your health right now? Patients sister stated his memory.   Has your provider asked that you check blood pressure, blood sugar, or follow special diet at home? Patients sister stated no but she checks his blood pressure every other day.  Do you get any type of exercise on a regular basis? Patients sister stated no.  Can you think of a goal you would like to reach for your health? Patients sister stated she would like for him to take better care of himself, and to start  exercising.   Do you have any problems getting your medications? Patient sister stated no.  Is there anything that you would like to discuss during the appointment? Patients sister stated no.  Please bring medications and supplements to appointment, patient reminded. Patient has been informed of all the benefits of CCM and would like to talk more about dispensing her brothers medication.    Follow-Up:Pharmacist Review  Charlann Lange, RMA Clinical Pharmacist Assistant [phone]  5 minutes spent in review, coordination, and documentation.  Reviewed by: Beverly Milch, PharmD Clinical Pharmacist [facility] [phone]

## 2021-03-26 ENCOUNTER — Ambulatory Visit: Payer: Medicare Other | Admitting: Pharmacist

## 2021-03-26 DIAGNOSIS — F01518 Vascular dementia, unspecified severity, with other behavioral disturbance: Secondary | ICD-10-CM

## 2021-03-26 DIAGNOSIS — F0151 Vascular dementia with behavioral disturbance: Secondary | ICD-10-CM

## 2021-03-26 DIAGNOSIS — Z8673 Personal history of transient ischemic attack (TIA), and cerebral infarction without residual deficits: Secondary | ICD-10-CM

## 2021-03-26 DIAGNOSIS — I10 Essential (primary) hypertension: Secondary | ICD-10-CM

## 2021-03-27 NOTE — Patient Instructions (Addendum)
Visit Information  Goals Addressed            This Visit's Progress   . Track and Manage My Blood Pressure-Hypertension       Timeframe:  Long-Range Goal Priority:  High Start Date:   03/26/21                          Expected End Date:  09/25/21                     Follow Up Date 07/19/21   - check blood pressure 3 times per week - choose a place to take my blood pressure (home, clinic or office, retail store)    Why is this important?    You won't feel high blood pressure, but it can still hurt your blood vessels.   High blood pressure can cause heart or kidney problems. It can also cause a stroke.   Making lifestyle changes like losing a little weight or eating less salt will help.   Checking your blood pressure at home and at different times of the day can help to control blood pressure.   If the doctor prescribes medicine remember to take it the way the doctor ordered.   Call the office if you cannot afford the medicine or if there are questions about it.     Notes: Will set up for RPM      Patient Care Plan: General Pharmacy (Adult)    Problem Identified: HTN, Hx of Stroke, Dementia   Priority: High  Onset Date: 03/26/2021    Long-Range Goal: Patient-Specific Goal   Start Date: 03/26/2021  Expected End Date: 09/26/2021  This Visit's Progress: On track  Priority: High  Note:   Current Barriers:  . Unable to independently monitor therapeutic efficacy . Unable to self administer medications as prescribed  Pharmacist Clinical Goal(s):  Marland Kitchen Patient will maintain control of blood pressure as evidenced by home monitoring/possible RPM.  . achieve ability to self administer medications as prescribed through use of pill packs as evidenced by patient report . contact provider office for questions/concerns as evidenced notation of same in electronic health record through collaboration with PharmD and provider.   Interventions: . 1:1 collaboration with Lavera Guise, MD  regarding development and update of comprehensive plan of care as evidenced by provider attestation and co-signature . Inter-disciplinary care team collaboration (see longitudinal plan of care) . Comprehensive medication review performed; medication list updated in electronic medical record  Hypertension (BP goal <140/90) -Controlled, based on recent office readings -Current treatment: . Amlodipine 5mg  daily -Medications previously tried: benazepril, Toprol XL  -Current home readings: patient's sister went home and texted me that his BP was 148/73 that day, no other home readings available -Current exercise habits: minimal -Denies hypotensive/hypertensive symptoms -Educated on BP goals and benefits of medications for prevention of heart attack, stroke and kidney damage; Daily salt intake goal < 2300 mg; Importance of home blood pressure monitoring; -Counseled to monitor BP at home daily, document, and provide log at future appointments -Patient may be good candidate for RPM for blood pressure monitoring as his dose of amlodipine was recently decreased.  His sister usually checks it at least every other day, however is interested in [place] being able to see the results real time. -If BP remains elevated >140/90 patient may benefit from increasing back to 10mg  dose. -He denies any swelling at this time. -Recommended to continue current medication  Recommended follow BP closely for dose titration if needed.   Hx of Stroke/CVA (Goal: Minimize risk) -Controlled -Current treatment  . Atorvastatin 80mg  daily . Aggrenox 200-25mg  twice daily -Medications previously tried: none noted -LDL well controlled, patient takes appropriately  -Recommended to continue current medication  Dementia (Goal: Slow progression/reduce symptom burden) -Controlled -Current treatment  . Donepezil 10mg  daily . Memantine 10mg  BID -Medications previously tried: none noted -Reports symptoms are stable -Sister is in  charge of medication administration and care for Mr. Reiling -She organizes his medications and checks his BP often -He denies any adverse effects from these medications including GI symptoms  -Recommended to continue current medication   Patient Goals/Self-Care Activities . Patient will:  - take medications as prescribed check blood pressure work towards synchronization for packaging of his medications., document, and provide at future appointments Consider RPM for blood pressure.  Follow Up Plan: The care management team will reach out to the patient again over the next 120 days.      Mr. Feick was given information about Chronic Care Management services today including:  1. CCM service includes personalized support from designated clinical staff supervised by his physician, including individualized plan of care and coordination with other care providers 2. 24/7 contact phone numbers for assistance for urgent and routine care needs. 3. Standard insurance, coinsurance, copays and deductibles apply for chronic care management only during months in which we provide at least 20 minutes of these services. Most insurances cover these services at 100%, however patients may be responsible for any copay, coinsurance and/or deductible if applicable. This service may help you avoid the need for more expensive face-to-face services. 4. Only one practitioner may furnish and bill the service in a calendar month. 5. The patient may stop CCM services at any time (effective at the end of the month) by phone call to the office staff.  Patient agreed to services and verbal consent obtained.   The patient verbalized understanding of instructions, educational materials, and care plan provided today and agreed to receive a mailed copy of patient instructions, educational materials, and care plan.  Telephone follow up appointment with pharmacy team member scheduled for: 4 months  Edythe Clarity, [facility]  How  to Take Your Blood Pressure Blood pressure measures how strongly your blood is pressing against the walls of your arteries. Arteries are blood vessels that carry blood from your heart throughout your body. You can take your blood pressure at home with a machine. You may need to check your blood pressure at home:  To check if you have high blood pressure (hypertension).  To check your blood pressure over time.  To make sure your blood pressure medicine is working. Supplies needed:  Blood pressure machine, or monitor.  Dining room chair to sit in.  Table or desk.  Small notebook.  Pencil or pen. How to prepare Avoid these things for 30 minutes before checking your blood pressure:  Having drinks with caffeine in them, such as coffee or tea.  Drinking alcohol.  Eating.  Smoking.  Exercising. Do these things five minutes before checking your blood pressure:  Go to the bathroom and pee (urinate).  Sit in a dining chair. Do not sit in a soft couch or an armchair.  Be quiet. Do not talk. How to take your blood pressure Follow the instructions that came with your machine. If you have a digital blood pressure monitor, these may be the instructions: 1. Sit up straight. 2. Place your  feet on the floor. Do not cross your ankles or legs. 3. Rest your left arm at the level of your heart. You may rest it on a table, desk, or chair. 4. Pull up your shirt sleeve. 5. Wrap the blood pressure cuff around the upper part of your left arm. The cuff should be 1 inch (2.5 cm) above your elbow. It is best to wrap the cuff around bare skin. 6. Fit the cuff snugly around your arm. You should be able to place only one finger between the cuff and your arm. 7. Place the cord so that it rests in the bend of your elbow. 8. Press the power button. 9. Sit quietly while the cuff fills with air and loses air. 10. Write down the numbers on the screen. 11. Wait 2-3 minutes and then repeat steps 1-10.    What do the numbers mean? Two numbers make up your blood pressure. The first number is called systolic pressure. The second is called diastolic pressure. An example of a blood pressure reading is "120 over 80" (or 120/80). If you are an adult and do not have a medical condition, use this guide to find out if your blood pressure is normal: Normal  First number: below 120.  Second number: below 80. Elevated  First number: 120-129.  Second number: below 80. Hypertension stage 1  First number: 130-139.  Second number: 80-89. Hypertension stage 2  First number: 140 or above.  Second number: 25 or above. Your blood pressure is above normal even if only the top or bottom number is above normal. Follow these instructions at home:  Check your blood pressure as often as your doctor tells you to.  Check your blood pressure at the same time every day.  Take your monitor to your next doctor's appointment. Your doctor will: ? Make sure you are using it correctly. ? Make sure it is working right.  Make sure you understand what your blood pressure numbers should be.  Tell your doctor if your medicine is causing side effects.  Keep all follow-up visits as told by your doctor. This is important. General tips:  You will need a blood pressure machine, or monitor. Your doctor can suggest a monitor. You can buy one at a drugstore or online. When choosing one: ? Choose one with an arm cuff. ? Choose one that wraps around your upper arm. Only one finger should fit between your arm and the cuff. ? Do not choose one that measures your blood pressure from your wrist or finger. Where to find more information American Heart Association: www.heart.org Contact a doctor if:  Your blood pressure keeps being high. Get help right away if:  Your first blood pressure number is higher than 180.  Your second blood pressure number is higher than 120. Summary  Check your blood pressure at the same  time every day.  Avoid caffeine, alcohol, smoking, and exercise for 30 minutes before checking your blood pressure.  Make sure you understand what your blood pressure numbers should be. This information is not intended to replace advice given to you by your health care provider. Make sure you discuss any questions you have with your health care provider. Document Revised: 12/01/2019 Document Reviewed: 12/01/2019 Elsevier Patient Education  2021 [employer].

## 2021-04-16 ENCOUNTER — Telehealth: Payer: Self-pay | Admitting: Pharmacist

## 2021-04-16 NOTE — Progress Notes (Addendum)
    Chronic Care Management Pharmacy Assistant   Name: Ian Conley  MRN: [id number] DOB: Mar 22, 1951  Reason for Encounter: RPM Monitoring   Medications: Outpatient Encounter Medications as of 04/16/2021  Medication Sig Note   amLODipine (NORVASC) 10 MG tablet Take 1 tablet (10 mg total) by mouth daily. 02/12/2021: Patient taking 1/2 tab   atorvastatin (LIPITOR) 80 MG tablet TAKE 1 TABLET(80 MG) BY MOUTH DAILY AT 6 PM    CLARITIN 10 MG CAPS Take by mouth daily.    dipyridamole-aspirin (AGGRENOX) 200-25 MG 12hr capsule Take 1 capsule by mouth 2 (two) times daily.    donepezil (ARICEPT) 10 MG tablet Take 1 tablet by mouth daily.     LORazepam (ATIVAN) 0.5 MG tablet Take 1/2 to 1 tablet po BID prn agitation.    memantine (NAMENDA) 10 MG tablet Take 10 mg by mouth 2 (two) times daily.    mirtazapine (REMERON) 7.5 MG tablet TAKE 1 TABLET(7.5 MG) BY MOUTH AT BEDTIME    Multiple Vitamins-Minerals (YOUR LIFE MULTI MENS 50+) TABS Take by mouth.    polyethylene glycol powder (GLYCOLAX/MIRALAX) powder as needed.     No facility-administered encounter medications on file as of 04/16/2021.    Called patient to check on his RPM blood pressure monitoring. I spoke with his sister which is his primary care taker she goes to his home and checks his blood pressure for me because he is unable to remember to do so. I informed her that she was doing a good job and they were processing over and we were able to see his blood pressure readings. She stated she goes to check on him every other day. I explained that we would like for his blood pressure to be checked at least 4 times a week equaling up to 16 times a month, she stated she would try her very best. I also asked about his blood pressure readings she stated he is usually very clam and relaxed when she checks his blood pressure and that may be the reason its been running a little low.    Follow-Up:Pharmacist Review  Charlann Lange, RMA Clinical  Pharmacist Assistant [phone]  5 minutes spent in review, coordination, and documentation.  Reviewed by: Beverly Milch, PharmD Clinical Pharmacist [facility] [phone]

## 2021-04-17 ENCOUNTER — Telehealth: Payer: Self-pay | Admitting: Pharmacist

## 2021-04-17 NOTE — Progress Notes (Addendum)
    Chronic Care Management Pharmacy Assistant   Name: Ian Conley  MRN: [id number] DOB: June 17, 1951  Reason for Encounter: Adherence Review  Medications: Outpatient Encounter Medications as of 04/17/2021  Medication Sig Note   amLODipine (NORVASC) 10 MG tablet Take 1 tablet (10 mg total) by mouth daily. 02/12/2021: Patient taking 1/2 tab   atorvastatin (LIPITOR) 80 MG tablet TAKE 1 TABLET(80 MG) BY MOUTH DAILY AT 6 PM    CLARITIN 10 MG CAPS Take by mouth daily.    dipyridamole-aspirin (AGGRENOX) 200-25 MG 12hr capsule Take 1 capsule by mouth 2 (two) times daily.    donepezil (ARICEPT) 10 MG tablet Take 1 tablet by mouth daily.     LORazepam (ATIVAN) 0.5 MG tablet Take 1/2 to 1 tablet po BID prn agitation.    memantine (NAMENDA) 10 MG tablet Take 10 mg by mouth 2 (two) times daily.    mirtazapine (REMERON) 7.5 MG tablet TAKE 1 TABLET(7.5 MG) BY MOUTH AT BEDTIME    Multiple Vitamins-Minerals (YOUR LIFE MULTI MENS 50+) TABS Take by mouth.    polyethylene glycol powder (GLYCOLAX/MIRALAX) powder as needed.     No facility-administered encounter medications on file as of 04/17/2021.   Reviewed the patients chart for any medical/health and/or medication changes, there were not any changes noted in the patients chart at this time.   Follow-Up:Pharmacist Review  Charlann Lange, RMA Clinical Pharmacist Assistant [phone]  1 minutes spent in review, coordination, and documentation.  Reviewed by: Beverly Milch, PharmD Clinical Pharmacist [facility] [phone]

## 2021-04-19 DIAGNOSIS — I10 Essential (primary) hypertension: Secondary | ICD-10-CM

## 2021-04-19 DIAGNOSIS — J439 Emphysema, unspecified: Secondary | ICD-10-CM

## 2021-04-19 DIAGNOSIS — M1611 Unilateral primary osteoarthritis, right hip: Secondary | ICD-10-CM

## 2021-04-21 ENCOUNTER — Other Ambulatory Visit: Payer: Self-pay

## 2021-04-21 ENCOUNTER — Ambulatory Visit (INDEPENDENT_AMBULATORY_CARE_PROVIDER_SITE_OTHER): Payer: Medicare Other | Admitting: Physician Assistant

## 2021-04-21 ENCOUNTER — Encounter: Payer: Self-pay | Admitting: Physician Assistant

## 2021-04-21 DIAGNOSIS — F01518 Vascular dementia, unspecified severity, with other behavioral disturbance: Secondary | ICD-10-CM

## 2021-04-21 DIAGNOSIS — I10 Essential (primary) hypertension: Secondary | ICD-10-CM | POA: Diagnosis not present

## 2021-04-21 DIAGNOSIS — I6389 Other cerebral infarction: Secondary | ICD-10-CM

## 2021-04-21 DIAGNOSIS — R634 Abnormal weight loss: Secondary | ICD-10-CM

## 2021-04-21 DIAGNOSIS — F0151 Vascular dementia with behavioral disturbance: Secondary | ICD-10-CM | POA: Diagnosis not present

## 2021-04-21 MED ORDER — LORAZEPAM 0.5 MG PO TABS: ORAL_TABLET | ORAL | 2 refills | Status: DC

## 2021-04-21 MED ORDER — AMLODIPINE BESYLATE 5 MG PO TABS: 5.0000 mg | ORAL_TABLET | Freq: Every day | ORAL | 3 refills | Status: DC

## 2021-04-21 NOTE — Progress Notes (Signed)
[facility] [place], [place] [address]  Internal MEDICINE  Office Visit Note  Patient Name: Ian Conley  [id number]  [id number]  Date of Service: 04/23/2021  Chief Complaint  Patient presents with  . Follow-up    Review labs, review meds  . Allergies  . Anemia  . Hypertension    HPI Pt is here for follow up. -BP 125/66 at home today. Currently doing 5mg  Norvasc. He is here with his sister. She is not with him everyday, and he lives in his home with his son who is autistic. -Does not eat as much as he used to. Has lost 3 lbs since last visit. Part of this is low appetite but also teeth problems. Discussed protein meal replacement and sister states they have shakes at hom efor him but he has refused them previously. -son and sister help with medications and will hopefully have these pre-packaged from pharmacy now to help him stay on top of meds. -She reports he takes lorazepam 0.5mg  tab 1 in Am and 1/2 tab at night to help with his behavioral changes related to dementia and this seems to work well. -Pt is able to get himself dressed, but per sister does not bathe regularly and does not eat enough. He also does not know how to check his BP at home and forgets to check it and son is unable to help with this. Pt would benefit from home health to help with checking vitals, ensuring appropriate meds, and taking care of himself in home. Pt has cologaurd but has not done it and really needs to complete this, especially given weight loss and inability to show for colonoscopy appt in past. Would benefit from someone hleping remind him to do this and send it in.  Current Medication: Outpatient Encounter Medications as of 04/21/2021  Medication Sig Note  . amLODipine (NORVASC) 5 MG tablet Take 1 tablet (5 mg total) by mouth daily.   Marland Kitchen atorvastatin (LIPITOR) 80 MG tablet TAKE 1 TABLET(80 MG) BY MOUTH DAILY AT 6 PM   . CLARITIN 10 MG CAPS Take by mouth daily.   Marland Kitchen  dipyridamole-aspirin (AGGRENOX) 200-25 MG 12hr capsule Take 1 capsule by mouth 2 (two) times daily.   Marland Kitchen donepezil (ARICEPT) 10 MG tablet Take 1 tablet by mouth daily.    Marland Kitchen LORazepam (ATIVAN) 0.5 MG tablet Take 1 tab in the AM and 0.5 tab in PM for agitation   . memantine (NAMENDA) 10 MG tablet Take 10 mg by mouth 2 (two) times daily.   . mirtazapine (REMERON) 7.5 MG tablet TAKE 1 TABLET(7.5 MG) BY MOUTH AT BEDTIME   . Multiple Vitamins-Minerals (YOUR LIFE MULTI MENS 50+) TABS Take by mouth.   . polyethylene glycol powder (GLYCOLAX/MIRALAX) powder as needed.    . [DISCONTINUED] amLODipine (NORVASC) 10 MG tablet Take 1 tablet (10 mg total) by mouth daily. 02/12/2021: Patient taking 1/2 tab  . [DISCONTINUED] LORazepam (ATIVAN) 0.5 MG tablet Take 1/2 to 1 tablet po BID prn agitation.    No facility-administered encounter medications on file as of 04/21/2021.    Surgical History: No past surgical history on file.  Medical History: Past Medical History:  Diagnosis Date  . Allergy   . Anemia   . Hypertension   . Pancreatitis   . Personal history of tobacco use, presenting hazards to health 03/25/2016  . Polycythemia   . Stroke [facility])    2008 9/11    Family History: Family History  Problem Relation  Age of Onset  . Diabetes Mother   . Heart failure Mother   . Cancer Father        Throat   . Stroke Father   . Hypertension Father   . HIV Brother   . Thyroid disease Sister        thyroid cancer  . Hypertension Sister        both sisters    Social History   Socioeconomic History  . Marital status: Widowed    Spouse name: Not on file  . Number of children: Not on file  . Years of education: Not on file  . Highest education level: Not on file  Occupational History  . Not on file  Tobacco Use  . Smoking status: Current Every Day Smoker    Packs/day: 1.00    Years: 45.00    Pack years: 45.00    Types: Cigarettes  . Smokeless tobacco: Never Used  Vaping Use  . Vaping Use: Never  used  Substance and Sexual Activity  . Alcohol use: Not Currently    Comment: when pt was at home, no alcohol since July, 2019  . Drug use: No  . Sexual activity: Not on file  Other Topics Concern  . Not on file  Social History Narrative  . Not on file   Social Determinants of Health   Financial Resource Strain: Not on file  Food Insecurity: Not on file  Transportation Needs: Not on file  Physical Activity: Not on file  Stress: Not on file  Social Connections: Not on file  Intimate Partner Violence: Not on file      Review of Systems  Constitutional: Positive for unexpected weight change. Negative for chills and fatigue.  HENT: Negative for congestion, postnasal drip, rhinorrhea, sneezing and sore throat.   Eyes: Negative for redness.  Respiratory: Negative for cough, chest tightness and shortness of breath.   Cardiovascular: Negative for chest pain and palpitations.  Gastrointestinal: Negative for abdominal pain, constipation, diarrhea, nausea and vomiting.  Genitourinary: Negative for dysuria and frequency.  Musculoskeletal: Negative for arthralgias, back pain, joint swelling and neck pain.  Skin: Negative for rash.  Neurological: Negative.  Negative for tremors and numbness.  Hematological: Negative for adenopathy. Does not bruise/bleed easily.  Psychiatric/Behavioral: Negative for behavioral problems (Depression), sleep disturbance and suicidal ideas. The patient is not nervous/anxious.        Dementia with behavioral disturbance    Vital Signs: BP 128/65   Pulse 88   Temp 98.7 F (37.1 C)   Resp 16   Ht 5\' 11"  (1.803 m)   Wt 141 lb (64 kg)   SpO2 91%   BMI 19.67 kg/m    Physical Exam Vitals and nursing note reviewed.  Constitutional:      General: He is not in acute distress.    Appearance: He is well-developed and normal weight. He is not diaphoretic.     Comments: Losing weight  HENT:     Head: Normocephalic and atraumatic.     Mouth/Throat:      Pharynx: No oropharyngeal exudate.  Eyes:     Pupils: Pupils are equal, round, and reactive to light.  Neck:     Thyroid: No thyromegaly.     Vascular: No JVD.     Trachea: No tracheal deviation.  Cardiovascular:     Rate and Rhythm: Normal rate and regular rhythm.     Heart sounds: Normal heart sounds. No murmur heard. No friction rub. No gallop.  Pulmonary:     Effort: Pulmonary effort is normal. No respiratory distress.     Breath sounds: No wheezing or rales.  Chest:     Chest wall: No tenderness.  Abdominal:     General: Bowel sounds are normal.     Palpations: Abdomen is soft.  Musculoskeletal:        General: Normal range of motion.     Cervical back: Normal range of motion and neck supple.  Lymphadenopathy:     Cervical: No cervical adenopathy.  Skin:    General: Skin is warm and dry.  Neurological:     Mental Status: He is alert and oriented to person, place, and time.     Cranial Nerves: No cranial nerve deficit.  Psychiatric:        Behavior: Behavior normal.        Thought Content: Thought content normal.        Judgment: Judgment normal.        Assessment/Plan: 1. Essential hypertension Has been doing better on lower dose of amlodipine, will send new script for lower dose. Pt requires help with med organization and will hopefully soon receive pre-packaged medications. Does not remember how to check BP at home and would benefit from home heath to check vitals and medications. - amLODipine (NORVASC) 5 MG tablet; Take 1 tablet (5 mg total) by mouth daily.  Dispense: 90 tablet; Refill: 3 - Ambulatory referral to Home Health  2. Cerebrovascular accident (CVA) due to other mechanism [facility]) Continue on aggrenox - Ambulatory referral to [place]  3. Dementia, multiinfarct, with behavioral disturbance ([place]) Behavioral disturbance well controlled on medication. Per sister pt does not bathe regularly and is unaware of this being a problem and also does not eat  much. Would benefit form home health. - LORazepam (ATIVAN) 0.5 MG tablet; Take 1 tab in the AM and 0.5 tab in PM for agitation  Dispense: 60 tablet; Refill: 2 - Ambulatory referral to Home Health  4. Weight loss Patient unable to complete prep for colonoscopy and given option to try once more with GI, also has cologuard at home to do instead if unable, but pt does not remember to do this and has yet to complete it. Educated on importance of one of these tests due to weight loss concerns. Also educated on trying a protien shake or meal replacement supplement to increase caloric intake without hurting teeth. - Ambulatory referral to Home Health    General Counseling: Larenzo verbalizes understanding of the findings of todays visit and agrees with plan of treatment. I have discussed any further diagnostic evaluation that may be needed or ordered today. We also reviewed his medications today. he has been encouraged to call the office with any questions or concerns that should arise related to todays visit.    Orders Placed This Encounter  Procedures  . Ambulatory referral to [facility] ordered this encounter  Medications  . amLODipine (NORVASC) 5 MG tablet    Sig: Take 1 tablet (5 mg total) by mouth daily.    Dispense:  90 tablet    Refill:  3  . LORazepam (ATIVAN) 0.5 MG tablet    Sig: Take 1 tab in the AM and 0.5 tab in PM for agitation    Dispense:  60 tablet    Refill:  2    This patient was seen by Drema Dallas, PA-C in collaboration with Dr. Clayborn Bigness as a part of collaborative care agreement.  Total time spent:30 Minutes Time spent includes review of chart, medications, test results, and follow up plan with the patient.      Dr Lavera Guise Internal medicine

## 2021-04-23 ENCOUNTER — Telehealth: Payer: Self-pay

## 2021-04-23 NOTE — Telephone Encounter (Signed)
Send message to [facility] for home health waiting for respond

## 2021-04-23 NOTE — Telephone Encounter (Signed)
-----   Message from Mylinda Latina, PA-C sent at 04/23/2021  8:21 AM EDT ----- It is pended, says it requires cosign again.Marland Kitchen ----- Message ----- From: Corlis Hove Sent: 04/22/2021   4:31 PM EDT To: Mylinda Latina, PA-C  Can you put order in epic  ----- Message ----- From: Mylinda Latina, PA-C Sent: 04/22/2021   4:04 PM EDT To: Corlis Hove  Can you do a home health order for him please... he has dementia and lives with autistic son. Would benefit from someone to check on him and his vitals, meds, and potentially help with cologuard since he received it but still has not done it.  Thank you

## 2021-04-24 ENCOUNTER — Ambulatory Visit: Payer: Medicare Other | Admitting: Podiatry

## 2021-04-24 ENCOUNTER — Telehealth: Payer: Medicare Other | Admitting: Gastroenterology

## 2021-04-25 ENCOUNTER — Telehealth: Payer: Self-pay | Admitting: Pharmacist

## 2021-04-25 NOTE — Progress Notes (Addendum)
    Chronic Care Management Pharmacy Assistant   Name: Ian Conley  MRN: [id number] DOB: [birth year]  Reason for Encounter: Medication Review/RPM Update   Conditions to be addressed/monitored: HTN, CVD  Recent office visits:  04/21/21 Mylinda Latina, PA-C For follow-up. No medication changes.  Recent consult visits:  None since 04/17/21  Hospital visits:  None since 04/17/21  Medications: Outpatient Encounter Medications as of 04/25/2021  Medication Sig   amLODipine (NORVASC) 5 MG tablet Take 1 tablet (5 mg total) by mouth daily.   atorvastatin (LIPITOR) 80 MG tablet TAKE 1 TABLET(80 MG) BY MOUTH DAILY AT 6 PM   CLARITIN 10 MG CAPS Take by mouth daily.   dipyridamole-aspirin (AGGRENOX) 200-25 MG 12hr capsule Take 1 capsule by mouth 2 (two) times daily.   donepezil (ARICEPT) 10 MG tablet Take 1 tablet by mouth daily.    LORazepam (ATIVAN) 0.5 MG tablet Take 1 tab in the AM and 0.5 tab in PM for agitation   memantine (NAMENDA) 10 MG tablet Take 10 mg by mouth 2 (two) times daily.   mirtazapine (REMERON) 7.5 MG tablet TAKE 1 TABLET(7.5 MG) BY MOUTH AT BEDTIME   Multiple Vitamins-Minerals (YOUR LIFE MULTI MENS 50+) TABS Take by mouth.   polyethylene glycol powder (GLYCOLAX/MIRALAX) powder as needed.    No facility-administered encounter medications on file as of 04/25/2021.   Called the patients sister in regards of her brothers medication and the update on dispensing with [facility]. She stated she does want to start dispensing but still had enough medications at home at this time expect for Lorazepam, she informed me that he was going to run out this medication Monday night. I submitted a ACUTE form for delivery on 04/28/21. Also we dicussed RPM and she voiced understanding. She stated sometimes he is combative and refuses to allow her to check his blood pressure at times.     Follow-Up:Pharmacist Review  Charlann Lange, RMA Clinical Pharmacist Assistant [phone]  10  minutes spent in review, coordination, and documentation.  Reviewed by: Beverly Milch, PharmD Clinical Pharmacist [facility] [phone]

## 2021-04-28 NOTE — Telephone Encounter (Signed)
14 additional  minutes spent in review, coordination, and documentation.  Patient was concerned with Lorazepam copay.  Corrected issue and completed ACUTE fill for Donepezil.  Patient will need new rx for donepezil called in to [facility].   Reviewed by: Beverly Milch, PharmD Clinical Pharmacist [phone]

## 2021-05-03 ENCOUNTER — Other Ambulatory Visit: Payer: Self-pay | Admitting: Internal Medicine

## 2021-05-03 DIAGNOSIS — F0151 Vascular dementia with behavioral disturbance: Secondary | ICD-10-CM

## 2021-05-03 DIAGNOSIS — F01518 Vascular dementia, unspecified severity, with other behavioral disturbance: Secondary | ICD-10-CM

## 2021-05-03 DIAGNOSIS — I10 Essential (primary) hypertension: Secondary | ICD-10-CM

## 2021-05-03 DIAGNOSIS — F321 Major depressive disorder, single episode, moderate: Secondary | ICD-10-CM

## 2021-05-03 MED ORDER — ATORVASTATIN CALCIUM 80 MG PO TABS: ORAL_TABLET | ORAL | 1 refills | Status: DC

## 2021-05-03 MED ORDER — ASPIRIN-DIPYRIDAMOLE ER 25-200 MG PO CP12: 1.0000 | ORAL_CAPSULE | Freq: Two times a day (BID) | ORAL | 1 refills | Status: DC

## 2021-05-03 MED ORDER — AMLODIPINE BESYLATE 5 MG PO TABS: 5.0000 mg | ORAL_TABLET | Freq: Every day | ORAL | 3 refills | Status: DC

## 2021-05-03 MED ORDER — MIRTAZAPINE 7.5 MG PO TABS: ORAL_TABLET | ORAL | 1 refills | Status: DC

## 2021-05-03 MED ORDER — LORAZEPAM 0.5 MG PO TABS: ORAL_TABLET | ORAL | 2 refills | Status: DC

## 2021-05-05 ENCOUNTER — Other Ambulatory Visit: Payer: Self-pay

## 2021-05-05 ENCOUNTER — Telehealth: Payer: Self-pay

## 2021-05-05 MED ORDER — MEMANTINE HCL 10 MG PO TABS
10.0000 mg | ORAL_TABLET | Freq: Two times a day (BID) | ORAL | 1 refills | Status: DC
Start: 2021-05-05 — End: 2022-01-21

## 2021-05-05 MED ORDER — DONEPEZIL HCL 10 MG PO TABS: 10.0000 mg | ORAL_TABLET | Freq: Every day | ORAL | 1 refills | Status: DC

## 2021-05-05 NOTE — Telephone Encounter (Signed)
-----   Message from Edythe Clarity, [facility] sent at 05/05/2021  9:53 AM EDT ----- Marykay Lex!  This patient was also asking for refills on donepezil and memantine sent to [facility].  Do we prescribe these?  I could not find the name of MD on the last order.  Thanks!

## 2021-05-05 NOTE — Telephone Encounter (Signed)
As per dr Humphrey Rolls send donepezil and memantine to [facility]

## 2021-05-20 ENCOUNTER — Telehealth: Payer: Self-pay | Admitting: Pharmacist

## 2021-05-20 DIAGNOSIS — I10 Essential (primary) hypertension: Secondary | ICD-10-CM | POA: Diagnosis not present

## 2021-05-20 DIAGNOSIS — M1611 Unilateral primary osteoarthritis, right hip: Secondary | ICD-10-CM | POA: Diagnosis not present

## 2021-05-20 DIAGNOSIS — J439 Emphysema, unspecified: Secondary | ICD-10-CM | POA: Diagnosis not present

## 2021-05-20 NOTE — Progress Notes (Addendum)
    Chronic Care Management Pharmacy Assistant   Name: LORI LIEW  MRN: [id number] DOB: 1951/07/24  Reason for Encounter: Adherence Review  Medications: Outpatient Encounter Medications as of 05/20/2021  Medication Sig   amLODipine (NORVASC) 5 MG tablet Take 1 tablet (5 mg total) by mouth daily.   atorvastatin (LIPITOR) 80 MG tablet TAKE 1 TABLET(80 MG) BY MOUTH DAILY AT 6 PM   CLARITIN 10 MG CAPS Take by mouth daily.   dipyridamole-aspirin (AGGRENOX) 200-25 MG 12hr capsule Take 1 capsule by mouth 2 (two) times daily.   donepezil (ARICEPT) 10 MG tablet Take 1 tablet (10 mg total) by mouth daily.   LORazepam (ATIVAN) 0.5 MG tablet Take 1 tab in the AM and 0.5 tab in PM for agitation   memantine (NAMENDA) 10 MG tablet Take 1 tablet (10 mg total) by mouth 2 (two) times daily.   mirtazapine (REMERON) 7.5 MG tablet TAKE 1 TABLET(7.5 MG) BY MOUTH AT BEDTIME   Multiple Vitamins-Minerals (YOUR LIFE MULTI MENS 50+) TABS Take by mouth.   polyethylene glycol powder (GLYCOLAX/MIRALAX) powder as needed.    No facility-administered encounter medications on file as of 05/20/2021.   Reviewed the patients chart for any medical/health and/or medication changes there were not any changes at this time.   Follow-up:Pharmacist Review   Charlann Lange, [place] Pharmacist Assistant [phone]

## 2021-06-09 ENCOUNTER — Telehealth (INDEPENDENT_AMBULATORY_CARE_PROVIDER_SITE_OTHER): Payer: Medicare Other | Admitting: Gastroenterology

## 2021-06-09 ENCOUNTER — Encounter: Payer: Self-pay | Admitting: Gastroenterology

## 2021-06-09 ENCOUNTER — Other Ambulatory Visit: Payer: Self-pay

## 2021-06-09 VITALS — Wt 141.0 lb

## 2021-06-09 DIAGNOSIS — R748 Abnormal levels of other serum enzymes: Secondary | ICD-10-CM | POA: Diagnosis not present

## 2021-06-09 NOTE — Progress Notes (Signed)
Ian Antigua, MD [address]  [place], [place] [address]  Main: [phone]  Fax: [phone]   Primary Care Physician: Lavera Guise, MD  Virtual Visit via Telephone Note  I connected with patient on 06/09/21 at  2:15 PM EDT by telephone and verified that I am speaking with the correct person using two identifiers.   I discussed the limitations, risks, security and privacy concerns of performing an evaluation and management service by telephone and the availability of in person appointments. I also discussed with the patient that there may be a patient responsible charge related to this service. The patient expressed understanding and agreed to proceed.  Location of Patient: Home Location of Provider: Home Persons involved: Patient and provider only during the visit (nursing staff and front desk staff was involved in communicating with the patient prior to the appointment, reviewing medications and checking them in)   History of Present Illness: Chief Complaint  Patient presents with   Follow-up    Denies any changes or new concerns other than memory     HPI: Ian Conley is a 70 y.o. male presents for follow-up along with his Sister Ian Conley who helps provide history.  We have previously tried to complete his colorectal cancer screening with colonoscopy, with multiple attempts at trying different preps including tablet preps.  Patient has been unable to comply with prep instructions.  As per recent PCP note, they have ordered Cologuard testing and even tried setting patient up with home health to be able to help him with his medical care.  As per the sister, patient has now refused home health.  Patient is otherwise alert and oriented and understands that these tests are pending.  The patient denies abdominal or flank pain, anorexia, nausea or vomiting, dysphagia, change in bowel habits or black or bloody stools or weight loss.  As per previous  notes: Patient has undergone extensive work-up with upper GI study, and CT scan, none of which reported any concerning mass, defects or malignancy.  However, upper GI study had reported mild smooth narrowing of the distal esophagus suggesting a stricture.  However, patient did not have any dysphagia on last visits, therefore no clinical symptoms of any stricture.   Previously, when EGD was being considered on his initial visits with [place] when he did have the dysphagia, the issue was that patient has had a recent stroke and holding Plavix would put patient at risks given his recent stroke in July 2019.   Then, his dysphagia completely resolved and after discussing risks and benefits of procedure, both family and patient decided against upper endoscopy.   No previous colonoscopy.  No family history of colon cancer.  Has family history of colon polyps in mother, brother.  However they do not know if any of these polyps were large or over 1 centimeter in size.    Current Outpatient Medications  Medication Sig Dispense Refill   amLODipine (NORVASC) 5 MG tablet Take 1 tablet (5 mg total) by mouth daily. 90 tablet 3   atorvastatin (LIPITOR) 80 MG tablet TAKE 1 TABLET(80 MG) BY MOUTH DAILY AT 6 PM 90 tablet 1   CLARITIN 10 MG CAPS Take by mouth daily.     dipyridamole-aspirin (AGGRENOX) 200-25 MG 12hr capsule Take 1 capsule by mouth 2 (two) times daily. 180 capsule 1   donepezil (ARICEPT) 10 MG tablet Take 1 tablet (10 mg total) by mouth daily. 90 tablet 1   LORazepam (ATIVAN)  0.5 MG tablet Take 1 tab in the AM and 0.5 tab in PM for agitation 60 tablet 2   memantine (NAMENDA) 10 MG tablet Take 1 tablet (10 mg total) by mouth 2 (two) times daily. 180 tablet 1   mirtazapine (REMERON) 7.5 MG tablet TAKE 1 TABLET(7.5 MG) BY MOUTH AT BEDTIME 90 tablet 1   Multiple Vitamins-Minerals (YOUR LIFE MULTI MENS 50+) TABS Take by mouth.     polyethylene glycol powder (GLYCOLAX/MIRALAX) powder as needed.   0   No  current facility-administered medications for this visit.    Allergies as of 06/09/2021 - Review Complete 06/09/2021  Allergen Reaction Noted   Penicillins Rash 03/19/2016    Review of Systems:    All systems reviewed and negative except where noted in HPI.   Observations/Objective:  Labs: CMP     Component Value Date/Time   NA 144 01/22/2021 1118   K 3.9 01/22/2021 1118   CL 107 (H) 01/22/2021 1118   CO2 23 01/22/2021 1118   GLUCOSE 82 01/22/2021 1118   GLUCOSE 110 (H) 02/12/2020 1017   BUN 4 (L) 01/22/2021 1118   CREATININE 1.00 01/22/2021 1118   CALCIUM 9.1 01/22/2021 1118   PROT 6.8 01/22/2021 1118   ALBUMIN 4.3 01/22/2021 1118   AST 19 01/22/2021 1118   ALT 18 01/22/2021 1118   ALKPHOS 132 (H) 01/22/2021 1118   BILITOT 0.2 01/22/2021 1118   GFRNONAA 77 01/22/2021 1118   GFRAA 89 01/22/2021 1118   Lab Results  Component Value Date   WBC 4.9 02/12/2021   HGB 14.0 02/12/2021   HCT 42.7 02/12/2021   MCV 90.3 02/12/2021   PLT 157 02/12/2021    Imaging Studies: No results found.  Assessment and Plan:   Ian Conley is a 69 y.o. y/o male with previously ordered colorectal cancer screening, who has been unable to comply with instructions here for follow-up along with his sister  Assessment and Plan: We again discussed the importance of colorectal cancer screening including the Cologuard testing that has been ordered by primary care provider  I have encouraged his sister to try different methods to help him remember to submit the Cologuard testing given that he has been unable to do his colonoscopy as detailed in HPI.  We discussed possibly writing down the instructions for Cologuard testing and submission on a note and putting it in his bathroom.  Sister states she will try that.  Have encouraged him to consider help from home health as his primary care provider has recommended as well  Sister and pt understands the benefits and risks of CRC screening and  that if cologuard is positive he would need a diagnostic Colonoscopy  I have also noted that his alk phos was recently mildly elevated.  Will repeat and check GGT  I have advised them to call us back if I can help in any other way or have any other questions  Follow up in 2-3 months or earlier if needed.   Follow Up Instructions:    I discussed the assessment and treatment plan with the patient. The patient was provided an opportunity to ask questions and all were answered. The patient agreed with the plan and demonstrated an understanding of the instructions.   The patient was advised to call back or seek an in-person evaluation if the symptoms worsen or if the condition fails to improve as anticipated.  I provided 15 minutes of non-face-to-face time during this encounter. Additional time was spent in  reviewing patient's chart, placing orders etc.   Virgel Manifold, MD  Speech recognition software was used to dictate this note.

## 2021-06-11 ENCOUNTER — Telehealth: Payer: Self-pay | Admitting: Pharmacist

## 2021-06-11 NOTE — Progress Notes (Addendum)
    Chronic Care Management Pharmacy Assistant   Name: Ian Conley  MRN: [id number] DOB: [birth year]  Reason for Encounter: Medication Review/Medication Coordination Call.   Medications: Outpatient Encounter Medications as of 06/11/2021  Medication Sig   amLODipine (NORVASC) 5 MG tablet Take 1 tablet (5 mg total) by mouth daily.   atorvastatin (LIPITOR) 80 MG tablet TAKE 1 TABLET(80 MG) BY MOUTH DAILY AT 6 PM   CLARITIN 10 MG CAPS Take by mouth daily.   dipyridamole-aspirin (AGGRENOX) 200-25 MG 12hr capsule Take 1 capsule by mouth 2 (two) times daily.   donepezil (ARICEPT) 10 MG tablet Take 1 tablet (10 mg total) by mouth daily.   LORazepam (ATIVAN) 0.5 MG tablet Take 1 tab in the AM and 0.5 tab in PM for agitation   memantine (NAMENDA) 10 MG tablet Take 1 tablet (10 mg total) by mouth 2 (two) times daily.   mirtazapine (REMERON) 7.5 MG tablet TAKE 1 TABLET(7.5 MG) BY MOUTH AT BEDTIME   Multiple Vitamins-Minerals (YOUR LIFE MULTI MENS 50+) TABS Take by mouth.   polyethylene glycol powder (GLYCOLAX/MIRALAX) powder as needed.    No facility-administered encounter medications on file as of 06/11/2021.    Reviewed chart for medication changes ahead of medication coordination call.  No OVs, hospital visits since last care coordination call.  Consults Visits: 06/09/21 Nanda Quinton, MD. For elevated alkaline phosphatase level. No medication changes.   No medication changes indicated.  BP Readings from Last 3 Encounters:  04/21/21 128/65  02/12/21 121/65  01/21/21 (!) 120/56    Lab Results  Component Value Date   HGBA1C 6.2 (H) 07/15/2018     Patient obtains medications through Adherence Packaging  90 Days   Patient is due for next adherence delivery on: 06/23/21.  Called patient and reviewed medications and coordinated delivery.  This delivery to include: Amodipine 5 mg 1 tablet for breakfast  Atorvastatin 80 mg 1 tablet at bedtime Aggrenox 200/25 mg 1 tablet  at breakfast and 1 tablet at bedtime Donepezil 10 mg 1 tablet at bedtime Memantine 10 mg 1 tablet breakfast and 1 tablet at bedtime  Mirtazapine 7.5 mg 1 tablet bedtime  Lorazepam 0.5 mg 1 tablet at breakfast and 1/2 (Pre-Cut) tablet at bedtime Claritin 10 mg 1 tablet for breakfast Your life Mulitple Men 1 tabet for breakfast  Patient declined the following medications: None  Patient don't need refills at this time.   Confirmed delivery date of 06/23/21, advised patient that pharmacy will contact them the morning of delivery.  Follow-Up:Pharmacist Review   Charlann Lange, RMA Clinical Pharmacist Assistant [phone]  10 minutes spent in review, coordination, and documentation.  Reviewed by: Beverly Milch, PharmD Clinical Pharmacist [facility] [phone]

## 2021-06-17 DIAGNOSIS — R413 Other amnesia: Secondary | ICD-10-CM | POA: Diagnosis not present

## 2021-06-17 DIAGNOSIS — I6782 Cerebral ischemia: Secondary | ICD-10-CM | POA: Diagnosis not present

## 2021-06-19 DIAGNOSIS — J439 Emphysema, unspecified: Secondary | ICD-10-CM | POA: Diagnosis not present

## 2021-06-19 DIAGNOSIS — I10 Essential (primary) hypertension: Secondary | ICD-10-CM | POA: Diagnosis not present

## 2021-06-19 DIAGNOSIS — M1611 Unilateral primary osteoarthritis, right hip: Secondary | ICD-10-CM | POA: Diagnosis not present

## 2021-06-20 ENCOUNTER — Telehealth: Payer: Self-pay | Admitting: Pharmacist

## 2021-06-20 ENCOUNTER — Other Ambulatory Visit: Payer: Self-pay | Admitting: Physician Assistant

## 2021-06-20 DIAGNOSIS — F01518 Vascular dementia, unspecified severity, with other behavioral disturbance: Secondary | ICD-10-CM

## 2021-06-20 MED ORDER — LORAZEPAM 0.5 MG PO TABS: ORAL_TABLET | ORAL | 0 refills | Status: DC

## 2021-06-20 NOTE — Chronic Care Management (AMB) (Signed)
Chronic Care Management   Follow Up Note   06/20/2021 Name: Ian Conley MRN: [id number] DOB: Jan 27, 1951  Referred by: Lavera Guise, MD Reason for referral : No chief complaint on file.   Ian Conley is a 70 y.o. year old male who is a primary care patient of Humphrey Rolls Timoteo Gaul, MD. The CCM team was consulted for assistance with chronic disease management and care coordination needs.    Review of patient status, including review of consultants reports, relevant laboratory and other test results, and collaboration with appropriate care team members and the patient's provider was performed as part of comprehensive patient evaluation and provision of chronic care management services.     Outpatient Encounter Medications as of 06/20/2021  Medication Sig   amLODipine (NORVASC) 5 MG tablet Take 1 tablet (5 mg total) by mouth daily.   atorvastatin (LIPITOR) 80 MG tablet TAKE 1 TABLET(80 MG) BY MOUTH DAILY AT 6 PM   CLARITIN 10 MG CAPS Take by mouth daily.   dipyridamole-aspirin (AGGRENOX) 200-25 MG 12hr capsule Take 1 capsule by mouth 2 (two) times daily.   donepezil (ARICEPT) 10 MG tablet Take 1 tablet (10 mg total) by mouth daily.   LORazepam (ATIVAN) 0.5 MG tablet Take 1 tab in the AM and 0.5 tab in PM for agitation   memantine (NAMENDA) 10 MG tablet Take 1 tablet (10 mg total) by mouth 2 (two) times daily.   mirtazapine (REMERON) 7.5 MG tablet TAKE 1 TABLET(7.5 MG) BY MOUTH AT BEDTIME   Multiple Vitamins-Minerals (YOUR LIFE MULTI MENS 50+) TABS Take by mouth.   polyethylene glycol powder (GLYCOLAX/MIRALAX) powder as needed.    No facility-administered encounter medications on file as of 06/20/2021.       [place]  Name: Ian Conley  Date of Birth: [birth year]  Gender: Male  Primary provider: Clayborn Bigness  RPM Coordinator: Charlann Lange  ICD Code: I10 Device ID: [id number] [place]: [employer] Measurement Type: blood pressure Days of Monitoring: 16 Documentation  Time: 40 Min 4 Sec BLOOD PRESSURE  Measurement Date: 06/12/2021 Thursday at 15:17 AM Systolic / Diastolic (mmHg): 616 / 68 Measurement Date: 06/11/2021 Wednesday at 07:37 AM Systolic / Diastolic (mmHg): 106 / 61 Measurement Date: 06/10/2021 Tuesday at 26:94 PM Systolic / Diastolic (mmHg): 854 / 59 Measurement Date: 06/09/2021 Monday at 62:70 PM Systolic / Diastolic (mmHg): 350 / 69 Measurement Date: 06/06/2021 Friday at 09:38 PM Systolic / Diastolic (mmHg): 182 / 55 Measurement Date: 06/04/2021 Wednesday at 99:37 PM Systolic / Diastolic (mmHg): 169 / 56 Measurement Date: 06/03/2021 Tuesday at 67:89 PM Systolic / Diastolic (mmHg): 381 / 56 Measurement Date: 05/31/2021 Saturday at 01:75 AM Systolic / Diastolic (mmHg): 102 / 65 Measurement Date: 05/30/2021 Friday at 58:52 PM Systolic / Diastolic (mmHg): 778 / 64 Measurement Date: 05/29/2021 Thursday at 24:23 PM Systolic / Diastolic (mmHg): 536 / 71 Measurement Date: 05/28/2021 Wednesday at 14:43 PM Systolic / Diastolic (mmHg): 154 / 69 Measurement Date: 05/27/2021 Tuesday at 00:86 PM Systolic / Diastolic (mmHg): 761 / 71 Measurement Date: 05/24/2021 Saturday at 95:09 PM Systolic / Diastolic (mmHg): 326 / 60 Measurement Date: 05/23/2021 Friday at 71:24 PM Systolic / Diastolic (mmHg): 580 / 67 Measurement Date: 05/22/2021 Thursday at 99:83 PM Systolic / Diastolic (mmHg): 382 / 64 Measurement Date: 05/21/2021 Wednesday at 50:53 PM Systolic / Diastolic (mmHg): 976 / 57  PULSE  Measurement Date: 06/12/2021 Thursday at 11:03 AM Pulse (IN BPM): 68 Measurement Date: 06/11/2021 Wednesday at 11:23 AM Pulse (IN BPM): 70  Measurement Date: 06/10/2021 Tuesday at 03:51 PM Pulse (IN BPM): 69 Measurement Date: 06/09/2021 Monday at 12:22 PM Pulse (IN BPM): 63 Measurement Date: 06/06/2021 Friday at 02:41 PM Pulse (IN BPM): 66 Measurement Date: 06/04/2021 Wednesday at 01:37 PM Pulse (IN BPM): 74 Measurement Date: 06/03/2021 Tuesday at 03:27 PM Pulse (IN BPM):  71 Measurement Date: 05/31/2021 Saturday at 10:34 AM Pulse (IN BPM): 63 Measurement Date: 05/30/2021 Friday at 12:43 PM Pulse (IN BPM): 70 Measurement Date: 05/29/2021 Thursday at 12:47 PM Pulse (IN BPM): 64 Measurement Date: 05/28/2021 Wednesday at 01:42 PM Pulse (IN BPM): 70 Measurement Date: 05/27/2021 Tuesday at 01:47 PM Pulse (IN BPM): 66 Measurement Date: 05/24/2021 Saturday at 01:33 PM Pulse (IN BPM): 69 Measurement Date: 05/23/2021 Friday at 02:34 PM Pulse (IN BPM): 69 Measurement Date: 05/22/2021 Thursday at 03:30 PM Pulse (IN BPM): 82 Measurement Date: 05/21/2021 Wednesday at 03:10 PM Pulse (IN BPM): 73        Notes for June-2022 : 40 Min 4 Sec  Date and Time: 06/19/2021 Thursday at 02:18 PM User: Leata Mouse Time logged: 08:00 Notes: DATA REVIEW SYSTOLIC BP  Automatically transmitted data is reviewed today from device. (ID: [id number]).  Systolic BP reading is >681 for 0 % of time  Systolic BP reading is between 160 to 179 for 0 % of time  Systolic BP reading is between 140 to 159 for 0 % of time  Systolic BP reading is between 120-139 for 18.75 % of time  Systolic BP reading is between 101-120 for 87.5 % of time  Systolic BP reading is <275 for 0 % of time  DIASTOLIC BP  Diastolic BP reading is elevated >100 for 0 % of time  Diastolic BP reading is between 90-99 for 0 % of time  PULSE  Pulse is >120 for 0 % of time  Pulse is <50 for 0 % of time  COMPLIANCE NOTES BLOOD PRESSURE  Patient is compliant - 16 days of readings obtained.  MANAGEMENT NOTES We will continue to monitor BP readings for now  Medications for BP reviewed for this patient  No change on the treatment plan   Date and Time: 06/19/2021 Thursday at 01:33 PM User: Charlann Lange Time logged: 14:04 Notes: COMPLIANCE NOTES BLOOD PRESSURE  Patient is compliant - 16 days of readings obtained.  REVIEW NOTES Low BP readings at times noted  Heart rate/Pulse data  reviewed  All the daily Blood pressure data noted since last review  Spoke with the patients caretaker "Sister-Shirley".    Date and Time: 06/12/2021 Thursday at 04:04 PM User: Charlann Lange Time logged: 02:00 Notes: REVIEW NOTES All the daily Blood pressure data noted since last review  Heart rate/Pulse data reviewed   Date and Time: 06/11/2021 Wednesday at 02:34 PM User: Charlann Lange Time logged: 02:00 Notes: REVIEW NOTES All the daily Blood pressure data noted since last review  Heart rate/Pulse data reviewed   Date and Time: 06/10/2021 Tuesday at 03:13 PM User: Charlann Lange Time logged: 02:00 Notes: REVIEW NOTES All the daily Blood pressure data noted since last review  Patient has Labile BP readings   Date and Time: 06/06/2021 Friday at 04:14 PM User: Charlann Lange Time logged: 02:00 Notes:  REVIEW NOTES All the daily Blood pressure data noted since last review  Heart rate/Pulse data reviewed   Date and Time: 06/02/2021 Monday at 12:51 PM User: Charlann Lange Time logged: 02:00 Notes: REVIEW NOTES All the daily Blood pressure data noted since last review  Heart rate/Pulse data  reviewed   Date and Time: 05/30/2021 Friday at 02:42 PM User: Charlann Lange Time logged: 02:00 Notes: REVIEW NOTES All the daily Blood pressure data noted since last review  Heart rate/Pulse data reviewed   Date and Time: 05/29/2021 Thursday at 03:43 PM User: Charlann Lange Time logged: 02:00 Notes: REVIEW NOTES All the daily Blood pressure data noted since last review  Heart rate/Pulse data reviewed   Date and Time: 05/27/2021 Tuesday at 03:56 PM User: Charlann Lange Time logged: 02:00 Notes: REVIEW NOTES All the daily Blood pressure data noted since last review  Heart rate/Pulse data reviewed   Date and Time: 05/26/2021 Monday at 11:41 AM User: Charlann Lange Time logged: 02:00 Notes: REVIEW NOTES All the daily  Blood pressure data noted since last review  Heart rate/Pulse data reviewed  Verbal Consent Obtained on March 27, 2021. Patient was notified about Remote Patient Monitoring (RPM) program offered by our medical practice to improve the patient care, co-ordination of care, patient education, provide quality of care, reduce the cost and provide personalized care. Patient was informed that this is Medicare approved program and can be furnished by only one provider, can be stopped by the patient anytime. Patient has verbally consented to enroll in this program and our designated care coordinator to contact by phone as needed at least for 20 minutes a month. Practice also provides 24/7 care to the patient needs. All patient questions were answered and patient was enrolled in the RPM program.

## 2021-06-24 ENCOUNTER — Ambulatory Visit (INDEPENDENT_AMBULATORY_CARE_PROVIDER_SITE_OTHER): Payer: Medicare Other | Admitting: Internal Medicine

## 2021-06-24 ENCOUNTER — Other Ambulatory Visit: Payer: Self-pay

## 2021-06-24 ENCOUNTER — Encounter: Payer: Self-pay | Admitting: Internal Medicine

## 2021-06-24 VITALS — BP 108/62 | HR 91 | Temp 97.5°F | Resp 16 | Ht 71.0 in | Wt 141.6 lb

## 2021-06-24 DIAGNOSIS — Z125 Encounter for screening for malignant neoplasm of prostate: Secondary | ICD-10-CM | POA: Diagnosis not present

## 2021-06-24 DIAGNOSIS — J449 Chronic obstructive pulmonary disease, unspecified: Secondary | ICD-10-CM | POA: Diagnosis not present

## 2021-06-24 DIAGNOSIS — R0600 Dyspnea, unspecified: Secondary | ICD-10-CM

## 2021-06-24 DIAGNOSIS — E782 Mixed hyperlipidemia: Secondary | ICD-10-CM | POA: Diagnosis not present

## 2021-06-24 DIAGNOSIS — I10 Essential (primary) hypertension: Secondary | ICD-10-CM

## 2021-06-24 DIAGNOSIS — R748 Abnormal levels of other serum enzymes: Secondary | ICD-10-CM | POA: Diagnosis not present

## 2021-06-24 DIAGNOSIS — G309 Alzheimer's disease, unspecified: Secondary | ICD-10-CM

## 2021-06-24 DIAGNOSIS — I700 Atherosclerosis of aorta: Secondary | ICD-10-CM

## 2021-06-24 DIAGNOSIS — R0602 Shortness of breath: Secondary | ICD-10-CM | POA: Diagnosis not present

## 2021-06-24 DIAGNOSIS — F0280 Dementia in other diseases classified elsewhere without behavioral disturbance: Secondary | ICD-10-CM

## 2021-06-24 MED ORDER — TRELEGY ELLIPTA 100-62.5-25 MCG/INH IN AEPB: 1.0000 | INHALATION_SPRAY | Freq: Every day | RESPIRATORY_TRACT | 11 refills | Status: DC

## 2021-06-24 NOTE — Progress Notes (Signed)
[address], [place] [address]  Internal MEDICINE  Office Visit Note  Patient Name: Ian Conley  [id number]  [id number]  Date of Service: 06/30/2021  Chief Complaint  Patient presents with   Acute Visit    Discuss tremors, balance and memory, wants lab work   Quality Metric Gaps    shingrix    HPI Patient is brought in by his sister in the room he is seen for sick visit for acute anxiety balance and memory issues however patient denied any of those complaints. Sister also wants to have set up lab work done. Patient continues smoke 3 packs/day recent CT chest has been negative even though he is short of breath but he does not want to use any inhalers. Anxiety medication to help him with his mood and attitude    Current Medication: Outpatient Encounter Medications as of 06/24/2021  Medication Sig   amLODipine (NORVASC) 5 MG tablet Take 1 tablet (5 mg total) by mouth daily.   atorvastatin (LIPITOR) 80 MG tablet TAKE 1 TABLET(80 MG) BY MOUTH DAILY AT 6 PM   CLARITIN 10 MG CAPS Take by mouth daily.   dipyridamole-aspirin (AGGRENOX) 200-25 MG 12hr capsule Take 1 capsule by mouth 2 (two) times daily.   donepezil (ARICEPT) 10 MG tablet Take 1 tablet (10 mg total) by mouth daily.   Fluticasone-Umeclidin-Vilant (TRELEGY ELLIPTA) 100-62.5-25 MCG/INH AEPB Inhale 1 puff into the lungs daily.   LORazepam (ATIVAN) 0.5 MG tablet Take 1 tab in the AM and 0.5 tab in PM for agitation   memantine (NAMENDA) 10 MG tablet Take 1 tablet (10 mg total) by mouth 2 (two) times daily.   mirtazapine (REMERON) 7.5 MG tablet TAKE 1 TABLET(7.5 MG) BY MOUTH AT BEDTIME   Multiple Vitamins-Minerals (YOUR LIFE MULTI MENS 50+) TABS Take by mouth.   polyethylene glycol powder (GLYCOLAX/MIRALAX) powder as needed.    No facility-administered encounter medications on file as of 06/24/2021.    Surgical History: History reviewed. No pertinent surgical history.  Medical  History: Past Medical History:  Diagnosis Date   Allergy    Anemia    Hypertension    Pancreatitis    Personal history of tobacco use, presenting hazards to health 03/25/2016   Polycythemia    Stroke [facility])    2008 9/11    Family History: Family History  Problem Relation Age of Onset   Diabetes Mother    Heart failure Mother    Cancer Father        Throat    Stroke Father    Hypertension Father    HIV Brother    Thyroid disease Sister        thyroid cancer   Hypertension Sister        both sisters    Social History   Socioeconomic History   Marital status: Widowed    Spouse name: Not on file   Number of children: Not on file   Years of education: Not on file   Highest education level: Not on file  Occupational History   Not on file  Tobacco Use   Smoking status: Every Day    Packs/day: 1.00    Years: 45.00    Pack years: 45.00    Types: Cigarettes   Smokeless tobacco: Never  Vaping Use   Vaping Use: Never used  Substance and Sexual Activity   Alcohol use: Not Currently    Comment: when pt was at home, no alcohol since  July, 2019   Drug use: No   Sexual activity: Not on file  Other Topics Concern   Not on file  Social History Narrative   Not on file   Social Determinants of Health   Financial Resource Strain: Not on file  Food Insecurity: Not on file  Transportation Needs: Not on file  Physical Activity: Not on file  Stress: Not on file  Social Connections: Not on file  Intimate Partner Violence: Not on file      Review of Systems  Constitutional:  Negative for chills, fatigue and unexpected weight change.  HENT:  Negative for congestion, postnasal drip, rhinorrhea, sneezing and sore throat.   Eyes:  Negative for redness.  Respiratory:  Positive for shortness of breath. Negative for cough and chest tightness.   Cardiovascular:  Negative for chest pain and palpitations.  Gastrointestinal:  Negative for abdominal pain, constipation, diarrhea,  nausea and vomiting.  Genitourinary:  Negative for dysuria and frequency.  Musculoskeletal:  Negative for arthralgias, back pain, joint swelling and neck pain.  Skin:  Negative for rash.  Neurological: Negative.  Negative for tremors and numbness.  Hematological:  Negative for adenopathy. Does not bruise/bleed easily.  Psychiatric/Behavioral:  Negative for behavioral problems (Depression), sleep disturbance and suicidal ideas. The patient is not nervous/anxious.    Vital Signs: BP 108/62   Pulse 91   Temp (!) 97.5 F (36.4 C)   Resp 16   Ht 5\' 11"  (1.803 m)   Wt 141 lb 9.6 oz (64.2 kg)   SpO2 97%   BMI 19.75 kg/m    Physical Exam Constitutional:      General: He is not in acute distress.    Appearance: He is well-developed. He is not diaphoretic.  HENT:     Head: Normocephalic and atraumatic.     Mouth/Throat:     Pharynx: No oropharyngeal exudate.  Eyes:     Extraocular Movements: Extraocular movements intact.     Pupils: Pupils are equal, round, and reactive to light.  Neck:     Thyroid: No thyromegaly.     Vascular: No JVD.     Trachea: No tracheal deviation.  Cardiovascular:     Rate and Rhythm: Normal rate and regular rhythm.     Heart sounds: Normal heart sounds. No murmur heard.   No friction rub. No gallop.  Pulmonary:     Effort: Pulmonary effort is normal. No respiratory distress.     Breath sounds: No wheezing or rales.  Chest:     Chest wall: No tenderness.  Abdominal:     General: Bowel sounds are normal.     Palpations: Abdomen is soft.  Musculoskeletal:        General: Normal range of motion.     Cervical back: Normal range of motion and neck supple.  Lymphadenopathy:     Cervical: No cervical adenopathy.  Skin:    General: Skin is warm and dry.  Neurological:     Mental Status: He is alert.     Cranial Nerves: No cranial nerve deficit.  Psychiatric:        Behavior: Behavior normal.     Comments: Patient is not  alert oriented to time and  place       Assessment/Plan: 1. Dyspnea, unspecified type Patient continues to be a smoker a 6-minute walk today is done and did not show any desaturations - 6 minute walk; Future - CBC with Differential/Platelet - Comprehensive metabolic panel  2. Chronic obstructive pulmonary  disease, unspecified COPD type [facility]) Patient has 3 pack/day smoker CT did show emphysema we will start Trelegy once a day - Fluticasone-Umeclidin-Vilant (TRELEGY ELLIPTA) 100-62.5-25 MCG/INH AEPB; Inhale 1 puff into the lungs daily.  Dispense: 1 each; Refill: 11  3. Special screening for malignant neoplasm of prostate PSA is ordered - PSA  4. Benign hypertension Blood pressure seems to be under good control we will continue to monitor  - Comprehensive metabolic panel - Urinalysis  5. Alzheimer disease [facility]) Patient has decline in his cognitive function we will continue to monitor - TSH - T4, free  6. Mixed hyperlipidemia Continue Lipitor as before - Lipid Panel With LDL/HDL Ratio  7. Atherosclerosis of aorta (HCC) Continue Lipitor as before - Lipid Panel With LDL/HDL Ratio   General Counseling: Kensley verbalizes understanding of the findings of todays visit and agrees with plan of treatment. I have discussed any further diagnostic evaluation that may be needed or ordered today. We also reviewed his medications today. he has been encouraged to call the office with any questions or concerns that should arise related to todays visit.    Orders Placed This Encounter  Procedures   CBC with Differential/Platelet   Lipid Panel With LDL/HDL Ratio   TSH   T4, free   Comprehensive metabolic panel   PSA   Urinalysis   6 minute walk    Meds ordered this encounter  Medications   Fluticasone-Umeclidin-Vilant (TRELEGY ELLIPTA) 100-62.5-25 MCG/INH AEPB    Sig: Inhale 1 puff into the lungs daily.    Dispense:  1 each    Refill:  11    Total time spent:30 Minutes Time spent includes review of  chart, medications, test results, and follow up plan with the patient.   [place] Controlled Substance Database was reviewed by me.   Dr Lavera Guise Internal medicine

## 2021-06-25 LAB — CBC WITH DIFFERENTIAL/PLATELET
Basophils Absolute: 0 10*3/uL (ref 0.0–0.2)
Basos: 0 %
EOS (ABSOLUTE): 0 10*3/uL (ref 0.0–0.4)
Eos: 0 %
Hematocrit: 40 % (ref 37.5–51.0)
Hemoglobin: 13.6 g/dL (ref 13.0–17.7)
Immature Grans (Abs): 0 10*3/uL (ref 0.0–0.1)
Immature Granulocytes: 0 %
Lymphocytes Absolute: 1 10*3/uL (ref 0.7–3.1)
Lymphs: 15 %
MCH: 29.3 pg (ref 26.6–33.0)
MCHC: 34 g/dL (ref 31.5–35.7)
MCV: 86 fL (ref 79–97)
Monocytes Absolute: 0.5 10*3/uL (ref 0.1–0.9)
Monocytes: 8 %
Neutrophils Absolute: 5.1 10*3/uL (ref 1.4–7.0)
Neutrophils: 77 %
Platelets: 153 10*3/uL (ref 150–450)
RBC: 4.64 x10E6/uL (ref 4.14–5.80)
RDW: 12.8 % (ref 11.6–15.4)
WBC: 6.6 10*3/uL (ref 3.4–10.8)

## 2021-06-25 LAB — HEPATIC FUNCTION PANEL
ALT: 14 IU/L (ref 0–44)
AST: 14 IU/L (ref 0–40)
Albumin: 4.2 g/dL (ref 3.8–4.8)
Alkaline Phosphatase: 111 IU/L (ref 44–121)
Bilirubin Total: 0.3 mg/dL (ref 0.0–1.2)
Bilirubin, Direct: 0.13 mg/dL (ref 0.00–0.40)
Total Protein: 6.1 g/dL (ref 6.0–8.5)

## 2021-06-25 LAB — COMPREHENSIVE METABOLIC PANEL
ALT: 13 IU/L (ref 0–44)
AST: 16 IU/L (ref 0–40)
Albumin/Globulin Ratio: 1.6 (ref 1.2–2.2)
Albumin: 4.1 g/dL (ref 3.8–4.8)
Alkaline Phosphatase: 114 IU/L (ref 44–121)
BUN/Creatinine Ratio: 8 — ABNORMAL LOW (ref 10–24)
BUN: 8 mg/dL (ref 8–27)
Bilirubin Total: <0.2 mg/dL (ref 0.0–1.2)
CO2: 27 mmol/L (ref 20–29)
Calcium: 9.2 mg/dL (ref 8.6–10.2)
Chloride: 108 mmol/L — ABNORMAL HIGH (ref 96–106)
Creatinine, Ser: 1.03 mg/dL (ref 0.76–1.27)
Globulin, Total: 2.5 g/dL (ref 1.5–4.5)
Glucose: 90 mg/dL (ref 65–99)
Potassium: 4 mmol/L (ref 3.5–5.2)
Sodium: 145 mmol/L — ABNORMAL HIGH (ref 134–144)
Total Protein: 6.6 g/dL (ref 6.0–8.5)
eGFR: 79 mL/min/{1.73_m2} (ref >59)

## 2021-06-25 LAB — LIPID PANEL WITH LDL/HDL RATIO
Cholesterol, Total: 106 mg/dL (ref 100–199)
HDL: 46 mg/dL (ref >39)
LDL Chol Calc (NIH): 44 mg/dL (ref 0–99)
LDL/HDL Ratio: 1 ratio (ref 0.0–3.6)
Triglycerides: 80 mg/dL (ref 0–149)
VLDL Cholesterol Cal: 16 mg/dL (ref 5–40)

## 2021-06-25 LAB — TSH: TSH: 0.419 u[IU]/mL — ABNORMAL LOW (ref 0.450–4.500)

## 2021-06-25 LAB — PSA: Prostate Specific Ag, Serum: 0.4 ng/mL (ref 0.0–4.0)

## 2021-06-25 LAB — T4, FREE: Free T4: 1.06 ng/dL (ref 0.82–1.77)

## 2021-06-25 LAB — GAMMA GT: GGT: 19 IU/L (ref 0–65)

## 2021-07-20 DIAGNOSIS — I10 Essential (primary) hypertension: Secondary | ICD-10-CM | POA: Diagnosis not present

## 2021-07-20 DIAGNOSIS — J439 Emphysema, unspecified: Secondary | ICD-10-CM | POA: Diagnosis not present

## 2021-07-20 DIAGNOSIS — M1611 Unilateral primary osteoarthritis, right hip: Secondary | ICD-10-CM | POA: Diagnosis not present

## 2021-07-24 ENCOUNTER — Ambulatory Visit: Payer: Medicare Other | Admitting: Nurse Practitioner

## 2021-07-29 ENCOUNTER — Telehealth: Payer: Self-pay

## 2021-07-30 ENCOUNTER — Ambulatory Visit: Payer: Medicare Other | Admitting: Pharmacist

## 2021-07-30 DIAGNOSIS — E782 Mixed hyperlipidemia: Secondary | ICD-10-CM

## 2021-07-30 DIAGNOSIS — I10 Essential (primary) hypertension: Secondary | ICD-10-CM

## 2021-07-30 NOTE — Patient Instructions (Addendum)
Visit Information   Goals Addressed             This Visit's Progress    Track and Manage My Blood Pressure-Hypertension   On track    Timeframe:  Long-Range Goal Priority:  High Start Date:   03/26/21                          Expected End Date:  09/25/21                     Follow Up Date 07/19/21   - check blood pressure 3 times per week - choose a place to take my blood pressure (home, clinic or office, retail store)    Why is this important?   You won't feel high blood pressure, but it can still hurt your blood vessels.  High blood pressure can cause heart or kidney problems. It can also cause a stroke.  Making lifestyle changes like losing a little weight or eating less salt will help.  Checking your blood pressure at home and at different times of the day can help to control blood pressure.  If the doctor prescribes medicine remember to take it the way the doctor ordered.  Call the office if you cannot afford the medicine or if there are questions about it.     Notes: Will set up for RPM       Patient Care Plan: General Pharmacy (Adult)     Problem Identified: HTN, Hx of Stroke, Dementia   Priority: High  Onset Date: 03/26/2021     Long-Range Goal: Patient-Specific Goal   Start Date: 03/26/2021  Expected End Date: 09/26/2021  Recent Progress: On track  Priority: High  Note:   Current Barriers:  Unable to independently monitor therapeutic efficacy Unable to self administer medications as prescribed  Pharmacist Clinical Goal(s):  Patient will maintain control of blood pressure as evidenced by home monitoring/possible RPM.  achieve ability to self administer medications as prescribed through use of pill packs as evidenced by patient report contact provider office for questions/concerns as evidenced notation of same in electronic health record through collaboration with PharmD and provider.   Interventions: 1:1 collaboration with Lavera Guise, MD regarding  development and update of comprehensive plan of care as evidenced by provider attestation and co-signature Inter-disciplinary care team collaboration (see longitudinal plan of care) Comprehensive medication review performed; medication list updated in electronic medical record  Hypertension (BP goal <140/90) -Controlled, based on recent office readings -Current treatment: Amlodipine '5mg'$  daily -Medications previously tried: benazepril, Toprol XL  -Current home readings: patient's sister went home and texted me that his BP was 148/73 that day, no other home readings available -Current exercise habits: minimal -Denies hypotensive/hypertensive symptoms -Educated on BP goals and benefits of medications for prevention of heart attack, stroke and kidney damage; Daily salt intake goal < 2300 mg; Importance of home blood pressure monitoring; -Counseled to monitor BP at home daily, document, and provide log at future appointments -Patient may be good candidate for RPM for blood pressure monitoring as his dose of amlodipine was recently decreased.  His sister usually checks it at least every other day, however is interested in [place] being able to see the results real time. -If BP remains elevated >140/90 patient may benefit from increasing back to '10mg'$  dose. -He denies any swelling at this time. -Recommended to continue current medication Recommended follow BP closely for dose titration if needed.  Update 07/30/21 Recent RPM BP all well controlled - patient denies any recent dizziness or headaches.  Satisfied with adherence packaging with medications - this is really helping his adherence.  Continue meds for now, continue RPM    Hx of Stroke/CVA (Goal: Minimize risk) -Controlled -Current treatment  Atorvastatin '80mg'$  daily Aggrenox 200-'25mg'$  twice daily -Medications previously tried: none noted -LDL well controlled, patient takes appropriately  -Recommended to continue current  medication  Update 07/30/21 Reviewed recent lipid panel, all WNL Congratulated patient on this! He continues to be adherent with meds and no adverse effects.  Continue current meds, continue current regular monitoring.  Dementia (Goal: Slow progression/reduce symptom burden) -Controlled -Current treatment  Donepezil '10mg'$  daily Memantine '10mg'$  BID -Medications previously tried: none noted -Reports symptoms are stable -Sister is in charge of medication administration and care for Mr. Billick -She organizes his medications and checks his BP often -He denies any adverse effects from these medications including GI symptoms  -Recommended to continue current medication   Patient Goals/Self-Care Activities Patient will:  - take medications as prescribed -continue med adherence with pill packs -Check BP daily with RPM device  Follow Up Plan: The care management team will reach out to the patient again over the next 120 days.       Patient verbalizes understanding of instructions provided today and agrees to view in [place].  Telephone follow up appointment with pharmacy team member scheduled for: 4 months  Edythe Clarity, [place]

## 2021-07-30 NOTE — Progress Notes (Signed)
Chronic Care Management Pharmacy Note  07/30/2021 Name:  Ian Conley MRN:  [id number] DOB:  06-02-1951  Subjective: Ian Conley is an 70 y.o. year old male who is a primary patient of Humphrey Rolls Timoteo Gaul, MD.  The CCM team was consulted for assistance with disease management and care coordination needs.    Engaged with patient by telephone for follow up visit in response to provider referral for pharmacy case management and/or care coordination services.   Consent to Services:  The patient was given the following information about Chronic Care Management services today, agreed to services, and gave verbal consent: 1. CCM service includes personalized support from designated clinical staff supervised by the primary care provider, including individualized plan of care and coordination with other care providers 2. 24/7 contact phone numbers for assistance for urgent and routine care needs. 3. Service will only be billed when office clinical staff spend 20 minutes or more in a month to coordinate care. 4. Only one practitioner may furnish and bill the service in a calendar month. 5.The patient may stop CCM services at any time (effective at the end of the month) by phone call to the office staff. 6. The patient will be responsible for cost sharing (co-pay) of up to 20% of the service fee (after annual deductible is met). Patient agreed to services and consent obtained.  Patient Care Team: Lavera Guise, MD as PCP - General (Internal Medicine) Ronnell Freshwater, NP as Referring Physician (Family Medicine) Bary Castilla Forest Gleason, MD as Consulting Physician (General Surgery) Virgel Manifold, MD as Consulting Physician (Gastroenterology) Lequita Asal, MD (Inactive) as Referring Physician (Hematology and Oncology) Edythe Clarity, [facility] as Pharmacist (Pharmacist)  Recent office visits: 06/24/21 Humphrey Rolls) - Trelegy started due to SOB, no other changes to medication.  01/21/21 Humphrey Rolls) -  No  changes to meds, consider d/c to amlodipine if BP low in the future  Recent consult visits: 02/12/21 Mike Gip) - No medication changes made at this visit   01/23/21 Prudence Davidson, podiatry) - foot care, nails debrided with no concerns.  RTC in 3 months  Hospital visits: None in previous 6 months  Objective:  Lab Results  Component Value Date   CREATININE 1.03 06/24/2021   BUN 8 06/24/2021   GFRNONAA 77 01/22/2021   GFRAA 89 01/22/2021   NA 145 (H) 06/24/2021   K 4.0 06/24/2021   CALCIUM 9.2 06/24/2021   CO2 27 06/24/2021   GLUCOSE 90 06/24/2021    Lab Results  Component Value Date/Time   HGBA1C 6.2 (H) 07/15/2018 04:52 AM    Last diabetic Eye exam: No results found for: HMDIABEYEEXA  Last diabetic Foot exam: No results found for: HMDIABFOOTEX   Lab Results  Component Value Date   CHOL 106 06/24/2021   HDL 46 06/24/2021   LDLCALC 44 06/24/2021   TRIG 80 06/24/2021   CHOLHDL 2.4 01/15/2020    Hepatic Function Latest Ref Rng & Units 06/24/2021 06/24/2021 01/22/2021  Total Protein 6.0 - 8.5 g/dL 6.6 6.1 6.8  Albumin 3.8 - 4.8 g/dL 4.1 4.2 4.3  AST 0 - 40 IU/L _0 ALT 0 - 44 IU/L _1 Alk Phosphatase 44 - 121 IU/L 114 111 132(H)  Total Bilirubin 0.0 - 1.2 mg/dL <0.2 0.3 0.2  Bilirubin, Direct 0.00 - 0.40 mg/dL - 0.13 -    Lab Results  Component Value Date/Time   TSH 0.419 (L) 06/24/2021 11:52 AM   TSH 0.857 01/22/2021 11:18  AM   FREET4 1.06 06/24/2021 11:52 AM   FREET4 1.32 01/22/2021 11:18 AM    CBC Latest Ref Rng & Units 06/24/2021 02/12/2021 01/22/2021  WBC 3.4 - 10.8 x10E3/uL 6.6 4.9 7.6  Hemoglobin 13.0 - 17.7 g/dL 13.6 14.0 13.4  Hematocrit 37.5 - 51.0 % 40.0 42.7 38.9  Platelets 150 - 450 x10E3/uL 153 157 156    No results found for: VD25OH  Clinical ASCVD: Yes  The ASCVD Risk score Mikey Bussing DC Jr., et al., 2013) failed to calculate for the following reasons:   The patient has a prior MI or stroke diagnosis    Depression screen [facility] 2/9 01/21/2021  10/28/2020 07/17/2020  Decreased Interest 0 0 0  Down, Depressed, Hopeless 0 0 0  PHQ - 2 Score 0 0 0  Altered sleeping - - -  Tired, decreased energy - - -  Change in appetite - - -  Feeling bad or failure about yourself  - - -  Trouble concentrating - - -  Moving slowly or fidgety/restless - - -  Suicidal thoughts - - -  PHQ-9 Score - - -      Social History   Tobacco Use  Smoking Status Every Day   Packs/day: 1.00   Years: 45.00   Pack years: 45.00   Types: Cigarettes  Smokeless Tobacco Never   BP Readings from Last 3 Encounters:  06/24/21 108/62  04/21/21 128/65  02/12/21 121/65   Pulse Readings from Last 3 Encounters:  06/24/21 91  04/21/21 88  02/12/21 75   Wt Readings from Last 3 Encounters:  06/24/21 141 lb 9.6 oz (64.2 kg)  06/09/21 141 lb (64 kg)  04/21/21 141 lb (64 kg)   BMI Readings from Last 3 Encounters:  06/24/21 19.75 kg/m  06/09/21 19.67 kg/m  04/21/21 19.67 kg/m    Assessment/Interventions: Review of patient past medical history, allergies, medications, health status, including review of consultants reports, laboratory and other test data, was performed as part of comprehensive evaluation and provision of chronic care management services.   SDOH:  (Social Determinants of Health) assessments and interventions performed: Yes   Financial Resource Strain: Not on file    SDOH Screenings   Alcohol Screen: Low Risk    Last Alcohol Screening Score (AUDIT): 2  Depression (PHQ2-9): Low Risk    PHQ-2 Score: 0  Financial Resource Strain: Not on file  Food Insecurity: Not on file  Housing: Not on file  Physical Activity: Not on file  Social Connections: Not on file  Stress: Not on file  Tobacco Use: High Risk   Smoking Tobacco Use: Every Day   Smokeless Tobacco Use: Never  Transportation Needs: Not on file    CCM Care Plan  Allergies  Allergen Reactions   Penicillins Rash    Has patient had a PCN reaction causing immediate rash,  facial/tongue/throat swelling, SOB or lightheadedness with hypotension: Yes Has patient had a PCN reaction causing severe rash involving mucus membranes or skin necrosis: Unknown Has patient had a PCN reaction that required hospitalization: No Has patient had a PCN reaction occurring within the last 10 years: No If all of the above answers are "NO", then may proceed with Cephalosporin use.    Medications Reviewed Today     Reviewed by Jimmye Norman, CMA (Certified Medical Assistant) on 06/24/21 at 1042  Med List Status: <None>   Medication Order Taking? Sig Documenting Provider Last Dose Status Informant  amLODipine (NORVASC) 5 MG tablet [id number]  Take 1  tablet (5 mg total) by mouth daily. Lavera Guise, MD  Active   atorvastatin (LIPITOR) 80 MG tablet [id number]  TAKE 1 TABLET(80 MG) BY MOUTH DAILY AT 6 PM Lavera Guise, MD  Active   CLARITIN 10 MG CAPS [id number]  Take by mouth daily. [provider]  Active   dipyridamole-aspirin (AGGRENOX) 200-25 MG 12hr capsule [id number]  Take 1 capsule by mouth 2 (two) times daily. Lavera Guise, MD  Active   donepezil (ARICEPT) 10 MG tablet [id number]  Take 1 tablet (10 mg total) by mouth daily. Lavera Guise, MD  Active   LORazepam (ATIVAN) 0.5 MG tablet [id number]  Take 1 tab in the AM and 0.5 tab in PM for agitation McDonough, Lauren K, PA-C  Active   memantine (NAMENDA) 10 MG tablet [id number]  Take 1 tablet (10 mg total) by mouth 2 (two) times daily. Lavera Guise, MD  Active   mirtazapine (REMERON) 7.5 MG tablet [id number]  TAKE 1 TABLET(7.5 MG) BY MOUTH AT BEDTIME Lavera Guise, MD  Active   Multiple Vitamins-Minerals (YOUR LIFE MULTI MENS 50+) TABS [id number]  Take by mouth. [provider]  Active   polyethylene glycol powder (GLYCOLAX/MIRALAX) powder [id number]  as needed.  [provider]  Active             Patient Active Problem List   Diagnosis Date Noted   Pain due to onychomycosis of toenails of both  feet 01/23/2021   Pre-ulcerative calluses 01/23/2021   Abnormal iron saturation 08/12/2020   Hemiplegia affecting dominant side, post-stroke ([place]) 10/24/2019   Thrombocytopenia, unspecified ([place]) 10/24/2019   Flu vaccine need 10/24/2019   Need for vaccination against Streptococcus pneumoniae using pneumococcal conjugate vaccine 7 10/24/2019   Encounter for long-term (current) use of medications 10/24/2019   Screening for colon cancer 10/24/2019   Chronic pulmonary emphysema not affecting current episode of care ([place]) 10/24/2019   Weight loss 08/14/2019   Numbness and tingling in left arm 06/26/2019   Dementia, multiinfarct, with behavioral disturbance ([place]) 01/23/2019   History of stroke 09/27/2018   Loss of memory 09/27/2018   Episode of moderate major depression ([place]) 08/18/2018   Onychomycosis of toenail 08/18/2018   Urinary tract infection without hematuria 08/18/2018   Dysuria 08/18/2018   Abnormality of esophagus 08/08/2018   Cerebrovascular accident (CVA) ([place]) 07/15/2018   Protein-calorie malnutrition, severe 07/14/2018   Altered mental status 07/13/2018   Sebaceous cyst 05/17/2018   Essential hypertension 05/04/2018   Acute upper respiratory infection 05/04/2018   Primary osteoarthritis of right hip 05/04/2018   Cerebrovascular disease 05/04/2018   Localized superficial swelling, mass, or lump 05/04/2018   Encounter for general adult medical examination with abnormal findings 05/04/2018   Secondary erythrocytosis 09/05/2016   Personal history of tobacco use, presenting hazards to health 03/25/2016   Gastric wall thickening 03/25/2016   Alkaline phosphatase elevation 03/10/2016    Immunization History  Administered Date(s) Administered   Influenza Inj Mdck Quad Pf 09/30/2018, 10/23/2019, 10/28/2020   Moderna Sars-Covid-2 Vaccination 01/22/2020, 02/29/2020   Pneumococcal Conjugate-13 10/25/2020   Tdap 07/18/2018    Conditions to be addressed/monitored:  HTN, Hx of  CVA/Stroke, Dementia.  Care Plan : General Pharmacy (Adult)  Updates made by Edythe Clarity, RPH since 07/30/2021 12:00 AM     Problem: HTN, Hx of Stroke, Dementia   Priority: High  Onset Date: 03/26/2021     Long-Range Goal: Patient-Specific Goal   Start Date: 03/26/2021  Expected End Date: 09/26/2021  Recent Progress: On track  Priority: High  Note:   Current Barriers:  Unable to independently monitor therapeutic efficacy Unable to self administer medications as prescribed  Pharmacist Clinical Goal(s):  Patient will maintain control of blood pressure as evidenced by home monitoring/possible RPM.  achieve ability to self administer medications as prescribed through use of pill packs as evidenced by patient report contact provider office for questions/concerns as evidenced notation of same in electronic health record through collaboration with PharmD and provider.   Interventions: 1:1 collaboration with Lavera Guise, MD regarding development and update of comprehensive plan of care as evidenced by provider attestation and co-signature Inter-disciplinary care team collaboration (see longitudinal plan of care) Comprehensive medication review performed; medication list updated in electronic medical record  Hypertension (BP goal <140/90) -Controlled, based on recent office readings -Current treatment: Amlodipine 5mg  daily -Medications previously tried: benazepril, Toprol XL  -Current home readings: patient's sister went home and texted me that his BP was 148/73 that day, no other home readings available -Current exercise habits: minimal -Denies hypotensive/hypertensive symptoms -Educated on BP goals and benefits of medications for prevention of heart attack, stroke and kidney damage; Daily salt intake goal < 2300 mg; Importance of home blood pressure monitoring; -Counseled to monitor BP at home daily, document, and provide log at future appointments -Patient may be good  candidate for RPM for blood pressure monitoring as his dose of amlodipine was recently decreased.  His sister usually checks it at least every other day, however is interested in [place] being able to see the results real time. -If BP remains elevated >140/90 patient may benefit from increasing back to 10mg  dose. -He denies any swelling at this time. -Recommended to continue current medication Recommended follow BP closely for dose titration if needed.  Update 07/30/21 Recent RPM BP all well controlled - patient denies any recent dizziness or headaches.  Satisfied with adherence packaging with medications - this is really helping his adherence.  Continue meds for now, continue RPM    Hx of Stroke/CVA (Goal: Minimize risk) -Controlled -Current treatment  Atorvastatin 80mg  daily Aggrenox 200-25mg  twice daily -Medications previously tried: none noted -LDL well controlled, patient takes appropriately  -Recommended to continue current medication  Update 07/30/21 Reviewed recent lipid panel, all WNL Congratulated patient on this! He continues to be adherent with meds and no adverse effects.  Continue current meds, continue current regular monitoring.  Dementia (Goal: Slow progression/reduce symptom burden) -Controlled -Current treatment  Donepezil 10mg  daily Memantine 10mg  BID -Medications previously tried: none noted -Reports symptoms are stable -Sister is in charge of medication administration and care for Mr. Appling -She organizes his medications and checks his BP often -He denies any adverse effects from these medications including GI symptoms  -Recommended to continue current medication   Patient Goals/Self-Care Activities Patient will:  - take medications as prescribed -continue med adherence with pill packs -Check BP daily with RPM device  Follow Up Plan: The care management team will reach out to the patient again over the next 120 days.        Medication  Assistance: None required.  Patient affirms current coverage meets needs.  Patient's preferred pharmacy is:  [facility] [place], [place] - [address] [place] [address] Phone: [phone] Fax: [phone]  [facility] - [place], [place] - [address] [place] [address] Phone: [phone] Fax: [phone]  Uses pill box?  No - Sister organizes in baggies every few days. Pt endorses 100% compliance  We discussed: Benefits of medication synchronization, packaging and delivery as well as enhanced pharmacist oversight with [facility]. Patient decided to: Utilize [facility] for medication synchronization, packaging and delivery  Care Plan and Follow Up Patient Decision:  Patient agrees to Care Plan and Follow-up.  Plan: The care management team will reach out to the patient again over the next 120 days.  Verbal consent obtained for [facility] enhanced pharmacy services (medication synchronization, adherence packaging, delivery coordination). A medication sync plan was created to allow patient to get all medications delivered once every 30 to 90 days per patient preference. Patient understands they have freedom to choose pharmacy and clinical pharmacist will coordinate care between all prescribers and [facility].   Beverly Milch, PharmD Clinical Pharmacist [facility] [phone]

## 2021-08-05 ENCOUNTER — Other Ambulatory Visit: Payer: Self-pay | Admitting: *Deleted

## 2021-08-05 DIAGNOSIS — D751 Secondary polycythemia: Secondary | ICD-10-CM

## 2021-08-12 ENCOUNTER — Other Ambulatory Visit: Payer: Medicare Other

## 2021-08-12 ENCOUNTER — Ambulatory Visit: Payer: Medicare Other | Admitting: Internal Medicine

## 2021-08-20 ENCOUNTER — Telehealth: Payer: Self-pay | Admitting: Pharmacist

## 2021-08-20 DIAGNOSIS — M1611 Unilateral primary osteoarthritis, right hip: Secondary | ICD-10-CM | POA: Diagnosis not present

## 2021-08-20 DIAGNOSIS — J439 Emphysema, unspecified: Secondary | ICD-10-CM | POA: Diagnosis not present

## 2021-08-20 DIAGNOSIS — I10 Essential (primary) hypertension: Secondary | ICD-10-CM | POA: Diagnosis not present

## 2021-08-20 NOTE — Chronic Care Management (AMB) (Signed)
Chronic Care Management   Outreach Note  08/20/2021 Name: Ian Conley MRN: [id number] DOB: 1951-06-03  Referred by: Lavera Guise, MD Reason for referral : Chronic Care Management (RPM Monthly - BP)    [name]  Name: Ian Conley  Date of Birth: [birth year]  Gender: Male  Primary provider: Clayborn Bigness  RPM Coordinator: Charlann Lange  ICD Code: I10 Device ID: [id number] [place]: [employer] Measurement Type: blood pressure Days of Monitoring: 26 Documentation Time: 25 Min 0 Sec BLOOD PRESSURE  Measurement Date: 08/19/2021 Tuesday at 123456 PM Systolic / Diastolic (mmHg): 99991111 / 57 Measurement Date: 08/18/2021 Monday at 0000000 PM Systolic / Diastolic (mmHg): XX123456 / 56 Measurement Date: 08/17/2021 Sunday at XX123456 PM Systolic / Diastolic (mmHg): 123XX123 / 66 Measurement Date: 08/16/2021 Saturday at 0000000 PM Systolic / Diastolic (mmHg): A999333 / 59 Measurement Date: 08/16/2021 Saturday at [id number] PM Systolic / Diastolic (mmHg): 99991111 / 60 Measurement Date: 08/16/2021 Saturday at XX123456 PM Systolic / Diastolic (mmHg): 123XX123 / 60 Measurement Date: 08/15/2021 Friday at [id number] PM Systolic / Diastolic (mmHg): 99991111 / 54 Measurement Date: 08/13/2021 Wednesday at XX123456 PM Systolic / Diastolic (mmHg): XX123456 / 60 Measurement Date: 08/12/2021 Tuesday at 99991111 PM Systolic / Diastolic (mmHg): A999333 / 59 Measurement Date: 08/11/2021 Monday at [id number] PM Systolic / Diastolic (mmHg): XX123456 / 58 Measurement Date: 08/10/2021 Sunday at 123456 PM Systolic / Diastolic (mmHg): 123456 / 61 Measurement Date: 08/09/2021 Saturday at [id number] PM Systolic / Diastolic (mmHg): A999333 / 58 Measurement Date: 08/08/2021 Friday at [id number] PM Systolic / Diastolic (mmHg): A999333 / 59 Measurement Date: 08/07/2021 Thursday at 99991111 PM Systolic / Diastolic (mmHg): XX123456 / 63 Measurement Date: 08/06/2021 Wednesday at [id number] PM Systolic / Diastolic (mmHg): [id number]-123456789 / 60 Measurement Date: 08/05/2021 Tuesday at 0000000 PM Systolic / Diastolic (mmHg): 99991111 /  60 Measurement Date: 08/03/2021 Sunday at 123456 PM Systolic / Diastolic (mmHg): 99991111 / 68 Measurement Date: 08/02/2021 Saturday at [id number] PM Systolic / Diastolic (mmHg): XX123456 / 57 Measurement Date: 07/31/2021 Thursday at 99991111 AM Systolic / Diastolic (mmHg): 99991111 / 66 Measurement Date: 07/30/2021 Wednesday at [id number] AM Systolic / Diastolic (mmHg): 123456 / 62 Measurement Date: 07/29/2021 Tuesday at [id number] PM Systolic / Diastolic (mmHg): XX123456 / 57 Measurement Date: 07/28/2021 Monday at 0000000 PM Systolic / Diastolic (mmHg): 123XX123 / 60 Measurement Date: 07/26/2021 Saturday at 123456 PM Systolic / Diastolic (mmHg): 99991111 / 58 Measurement Date: 07/25/2021 Friday at [id number] AM Systolic / Diastolic (mmHg): 123456 / 68 Measurement Date: 07/24/2021 Thursday at [id number] PM Systolic / Diastolic (mmHg): 99991111 / 57 Measurement Date: 07/23/2021 Wednesday at XX123456 PM Systolic / Diastolic (mmHg): 123456 / 67 Measurement Date: 07/22/2021 Tuesday at [id number] PM Systolic / Diastolic (mmHg): 99991111 / 58 Measurement Date: 07/21/2021 Monday at 0000000 PM Systolic / Diastolic (mmHg): [id number] / 64  PULSE  Measurement Date: 08/19/2021 Tuesday at 08:57 PM Pulse (IN BPM): 64 Measurement Date: 08/18/2021 Monday at 09:04 PM Pulse (IN BPM): 67 Measurement Date: 08/17/2021 Sunday at 09:02 PM Pulse (IN BPM): 66 Measurement Date: 08/16/2021 Saturday at 06:39 PM Pulse (IN BPM): 69 Measurement Date: 08/16/2021 Saturday at 01:32 PM Pulse (IN BPM): 68 Measurement Date: 08/16/2021 Saturday at 01:30 PM Pulse (IN BPM): 67 Measurement Date: 08/15/2021 Friday at 04:57 PM Pulse (IN BPM): 66 Measurement Date: 08/13/2021 Wednesday at 08:53 PM Pulse (IN BPM): 62 Measurement Date: 08/12/2021 Tuesday at 08:21 PM Pulse (IN BPM): 63 Measurement Date: 08/11/2021 Monday at 03:23 PM Pulse (IN BPM): 63 Measurement Date: 08/10/2021 Sunday at 07:57  PM Pulse (IN BPM): 61 Measurement Date: 08/09/2021 Saturday at 08:54 PM Pulse (IN BPM): 62 Measurement Date: 08/08/2021 Friday at 07:10  PM Pulse (IN BPM): 69 Measurement Date: 08/07/2021 Thursday at 07:23 PM Pulse (IN BPM): 72 Measurement Date: 08/06/2021 Wednesday at 04:51 PM Pulse (IN BPM): 71 Measurement Date: 08/05/2021 Tuesday at 02:13 PM Pulse (IN BPM): 76 Measurement Date: 08/03/2021 Sunday at 07:17 PM Pulse (IN BPM): 65 Measurement Date: 08/02/2021 Saturday at 03:55 PM Pulse (IN BPM): 59 Measurement Date: 07/31/2021 Thursday at 11:41 AM Pulse (IN BPM): 69 Measurement Date: 07/30/2021 Wednesday at 11:24 AM Pulse (IN BPM): 71 Measurement Date: 07/29/2021 Tuesday at 07:29 PM Pulse (IN BPM): 67 Measurement Date: 07/28/2021 Monday at 04:42 PM Pulse (IN BPM): 72 Measurement Date: 07/26/2021 Saturday at 09:21 PM Pulse (IN BPM): 65 Measurement Date: 07/25/2021 Friday at 11:55 AM Pulse (IN BPM): 67 Measurement Date: 07/24/2021 Thursday at 06:55 PM Pulse (IN BPM): 65 Measurement Date: 07/23/2021 Wednesday at 04:26 PM Pulse (IN BPM): 71 Measurement Date: 07/22/2021 Tuesday at 07:32 PM Pulse (IN BPM): 67 Measurement Date: 07/21/2021 Monday at 12:33 PM Pulse (IN BPM): 68        Notes for August-2022 : 51 Min 0 Sec  Date and Time: 08/20/2021 Wednesday at 03:42 PM User: Leata Mouse Time logged: 05:00 Notes: DATA REVIEW SYSTOLIC BP  Automatically transmitted data is reviewed today from device. (ID: [id number]).  Systolic BP reading is XX123456 for 0 % of time  Systolic BP reading is between 160 to 179 for 0 % of time  Systolic BP reading is between 140 to 159 for 0 % of time  Systolic BP reading is between 120-139 for 0 % of time  Systolic BP reading is between 101-120 for 100.0 % of time  Systolic BP reading is 99991111 for 0 % of time  DIASTOLIC BP  Diastolic BP reading is elevated >100 for 0 % of time  Diastolic BP reading is between 90-99 for 0 % of time  PULSE  Pulse is >120 for 0 % of time  Pulse is <50 for 0 % of time  COMPLIANCE NOTES BLOOD PRESSURE  Patient is compliant - 16 days of readings  obtained.  MANAGEMENT NOTES We will continue to monitor BP readings for now  Medications for BP reviewed for this patient  No change on the treatment plan   Date and Time: 08/19/2021 Tuesday at 10:20 AM User: Charlann Lange Time logged: 16:00 Notes: COMPLIANCE NOTES BLOOD PRESSURE  Patient is compliant - 16 days of readings obtained.  REVIEW NOTES All the daily Blood pressure data noted since last review  Heart rate/Pulse data reviewed  Blood pressure is getting under better control   Date and Time: 08/11/2021 Monday at 04:29 PM User: Charlann Lange Time logged: 06:00 Notes: REVIEW NOTES All the daily Blood pressure data noted since last review  Heart rate/Pulse data reviewed   Date and Time: 08/07/2021 Thursday at 03:58 PM User: Charlann Lange Time logged: 02:00 Notes: REVIEW NOTES All the daily Blood pressure data noted since last review  Heart rate/Pulse data reviewed   Date and Time: 08/05/2021 Tuesday at 04:32 PM User: Charlann Lange Time logged: 02:00 Notes: REVIEW NOTES All the daily Blood pressure data noted since last review  Heart rate/Pulse data reviewed   Date and Time: 08/04/2021 Monday at 04:17 PM User: Charlann Lange Time logged: 04:00 Notes: REVIEW NOTES All the daily Blood pressure data noted since last review  Heart rate/Pulse data reviewed   Date and  Time: 07/31/2021 Thursday at 02:44 PM User: Charlann Lange Time logged: 02:00 Notes: REVIEW NOTES All the daily Blood pressure data noted since last review  Heart rate/Pulse data reviewed   Date and Time: 07/30/2021 Wednesday at 03:58 PM User: Charlann Lange Time logged: 04:00 Notes: REVIEW NOTES All the daily Blood pressure data noted since last review  Heart rate/Pulse data reviewed   Date and Time: 07/28/2021 Monday at 02:36 PM User: Charlann Lange Time logged: 02:00 Notes: REVIEW NOTES All the daily Blood pressure data noted since  last review  Heart rate/Pulse data reviewed   Date and Time: 07/25/2021 Friday at 03:37 PM User: Charlann Lange Time logged: 02:00 Notes: REVIEW NOTES BP numbers are stable except some elevations  Heart rate/Pulse data reviewed   Date and Time: 07/24/2021 Thursday at 08:14 AM User: Charlann Lange Time logged: 04:00 Notes: REVIEW NOTES All the daily Blood pressure data noted since last review  Heart rate/Pulse data reviewed   Date and Time: 07/21/2021 Monday at 03:28 PM User: Charlann Lange Time logged: 08:00 Notes: REVIEW NOTES All the daily Blood pressure data noted since last review  Heart rate/Pulse data reviewed  Verbal Consent Obtained on March 27, 2021. Patient was notified about Remote Patient Monitoring (RPM) program offered by our medical practice to improve the patient care, co-ordination of care, patient education, provide quality of care, reduce the cost and provide personalized care. Patient was informed that this is Medicare approved program and can be furnished by only one provider, can be stopped by the patient anytime. Patient has verbally consented to enroll in this program and our designated care coordinator to contact by phone as needed at least for 20 minutes a month. Practice also provides 24/7 care to the patient needs. All patient questions were answered and patient was enrolled in the RPM program.

## 2021-08-28 ENCOUNTER — Encounter (INDEPENDENT_AMBULATORY_CARE_PROVIDER_SITE_OTHER): Payer: Self-pay

## 2021-08-28 ENCOUNTER — Encounter: Payer: Self-pay | Admitting: Physician Assistant

## 2021-08-28 ENCOUNTER — Ambulatory Visit (INDEPENDENT_AMBULATORY_CARE_PROVIDER_SITE_OTHER): Payer: Medicare Other | Admitting: Physician Assistant

## 2021-08-28 ENCOUNTER — Other Ambulatory Visit: Payer: Self-pay

## 2021-08-28 VITALS — BP 100/62 | HR 79 | Temp 98.6°F | Resp 16 | Ht 71.0 in | Wt 144.0 lb

## 2021-08-28 DIAGNOSIS — Z23 Encounter for immunization: Secondary | ICD-10-CM

## 2021-08-28 DIAGNOSIS — J449 Chronic obstructive pulmonary disease, unspecified: Secondary | ICD-10-CM

## 2021-08-28 DIAGNOSIS — I10 Essential (primary) hypertension: Secondary | ICD-10-CM

## 2021-08-28 DIAGNOSIS — F0280 Dementia in other diseases classified elsewhere without behavioral disturbance: Secondary | ICD-10-CM

## 2021-08-28 DIAGNOSIS — I700 Atherosclerosis of aorta: Secondary | ICD-10-CM | POA: Diagnosis not present

## 2021-08-28 DIAGNOSIS — Z8673 Personal history of transient ischemic attack (TIA), and cerebral infarction without residual deficits: Secondary | ICD-10-CM

## 2021-08-28 DIAGNOSIS — G309 Alzheimer's disease, unspecified: Secondary | ICD-10-CM | POA: Diagnosis not present

## 2021-08-28 DIAGNOSIS — E782 Mixed hyperlipidemia: Secondary | ICD-10-CM

## 2021-08-28 NOTE — Progress Notes (Signed)
[facility] [place], [place] [address]  Internal MEDICINE  Office Visit Note  Patient Name: Ian Conley  [id number]  [id number]  Date of Service: 08/28/2021  Chief Complaint  Patient presents with   Follow-up   Anemia   Hypertension    HPI Pt is here for follow up with his sister -Followed by hematology already for anemia, thinks next follow up is in Nov -BP on the low side but stable. Denies any dizziness or lightheadedness. May need to consider decresing amlodipine if continues to remain on low side. His Bp is being tracked daily via care management report. BP have ranged low 99991111 systolic -trying to walk some around home and yard to stay active and change surroundings slightly. Walks with 4 point cane. Does have some weakness on left side from stroke, though strength is fair on exam today -Still smokes 0.5-1ppd and is working to cut back some more. Uses trelegy inhaler -Reviewed labs ordered last visit and looked good. His sodium was slightly elevated and may have been slightly dehydrated. -No further changes with memory per sister, appears to be about the same -pt denies any problems or complaints today  Current Medication: Outpatient Encounter Medications as of 08/28/2021  Medication Sig   amLODipine (NORVASC) 5 MG tablet Take 1 tablet (5 mg total) by mouth daily.   atorvastatin (LIPITOR) 80 MG tablet TAKE 1 TABLET(80 MG) BY MOUTH DAILY AT 6 PM   CLARITIN 10 MG CAPS Take by mouth daily.   dipyridamole-aspirin (AGGRENOX) 200-25 MG 12hr capsule Take 1 capsule by mouth 2 (two) times daily.   donepezil (ARICEPT) 10 MG tablet Take 1 tablet (10 mg total) by mouth daily.   Fluticasone-Umeclidin-Vilant (TRELEGY ELLIPTA) 100-62.5-25 MCG/INH AEPB Inhale 1 puff into the lungs daily.   LORazepam (ATIVAN) 0.5 MG tablet Take 1 tab in the AM and 0.5 tab in PM for agitation   memantine (NAMENDA) 10 MG tablet Take 1 tablet (10 mg total) by mouth 2 (two) times  daily.   mirtazapine (REMERON) 7.5 MG tablet TAKE 1 TABLET(7.5 MG) BY MOUTH AT BEDTIME   Multiple Vitamins-Minerals (YOUR LIFE MULTI MENS 50+) TABS Take by mouth.   polyethylene glycol powder (GLYCOLAX/MIRALAX) powder as needed.    No facility-administered encounter medications on file as of 08/28/2021.    Surgical History: History reviewed. No pertinent surgical history.  Medical History: Past Medical History:  Diagnosis Date   Allergy    Anemia    Hypertension    Pancreatitis    Personal history of tobacco use, presenting hazards to health 03/25/2016   Polycythemia    Stroke [facility])    2008 9/11    Family History: Family History  Problem Relation Age of Onset   Diabetes Mother    Heart failure Mother    Cancer Father        Throat    Stroke Father    Hypertension Father    HIV Brother    Thyroid disease Sister        thyroid cancer   Hypertension Sister        both sisters    Social History   Socioeconomic History   Marital status: Widowed    Spouse name: Not on file   Number of children: Not on file   Years of education: Not on file   Highest education level: Not on file  Occupational History   Not on file  Tobacco Use   Smoking status: Every Day  Packs/day: 1.00    Years: 45.00    Pack years: 45.00    Types: Cigarettes   Smokeless tobacco: Never  Vaping Use   Vaping Use: Never used  Substance and Sexual Activity   Alcohol use: Not Currently    Comment: when pt was at home, no alcohol since July, 2019   Drug use: No   Sexual activity: Not on file  Other Topics Concern   Not on file  Social History Narrative   Not on file   Social Determinants of Health   Financial Resource Strain: Not on file  Food Insecurity: Not on file  Transportation Needs: Not on file  Physical Activity: Not on file  Stress: Not on file  Social Connections: Not on file  Intimate Partner Violence: Not on file      Review of Systems  Constitutional:  Negative for  chills, fatigue and unexpected weight change.  HENT:  Negative for congestion, postnasal drip, rhinorrhea, sneezing and sore throat.   Eyes:  Negative for redness.  Respiratory:  Negative for cough, chest tightness and shortness of breath.   Cardiovascular:  Negative for chest pain and palpitations.  Gastrointestinal:  Negative for abdominal pain, constipation, diarrhea, nausea and vomiting.  Genitourinary:  Negative for dysuria and frequency.  Musculoskeletal:  Negative for arthralgias, back pain, joint swelling and neck pain.  Skin:  Negative for rash.  Neurological:  Positive for weakness. Negative for tremors and numbness.  Hematological:  Negative for adenopathy. Does not bruise/bleed easily.  Psychiatric/Behavioral:  Negative for behavioral problems (Depression), sleep disturbance and suicidal ideas. The patient is not nervous/anxious.    Vital Signs: BP 100/62   Pulse 79   Temp 98.6 F (37 C)   Resp 16   Ht '5\' 11"'$  (1.803 m)   Wt 144 lb (65.3 kg)   SpO2 97%   BMI 20.08 kg/m    Physical Exam Vitals and nursing note reviewed.  Constitutional:      General: He is not in acute distress.    Appearance: He is well-developed and normal weight. He is not diaphoretic.  HENT:     Head: Normocephalic and atraumatic.     Mouth/Throat:     Pharynx: No oropharyngeal exudate.  Eyes:     Pupils: Pupils are equal, round, and reactive to light.  Neck:     Thyroid: No thyromegaly.     Vascular: No JVD.     Trachea: No tracheal deviation.  Cardiovascular:     Rate and Rhythm: Normal rate and regular rhythm.     Heart sounds: Normal heart sounds. No murmur heard.   No friction rub. No gallop.  Pulmonary:     Effort: Pulmonary effort is normal. No respiratory distress.     Breath sounds: No wheezing or rales.  Chest:     Chest wall: No tenderness.  Abdominal:     General: Bowel sounds are normal.     Palpations: Abdomen is soft.  Musculoskeletal:        General: Normal range of  motion.     Cervical back: Normal range of motion and neck supple.  Lymphadenopathy:     Cervical: No cervical adenopathy.  Skin:    General: Skin is warm and dry.  Neurological:     Mental Status: He is alert and oriented to person, place, and time.     Cranial Nerves: No cranial nerve deficit.     Motor: Weakness present.     Gait: Gait abnormal.  Psychiatric:        Behavior: Behavior normal.        Thought Content: Thought content normal.        Judgment: Judgment normal.       Assessment/Plan: 1. Essential hypertension On the low side in office but asymptomatic, will continue monitoring via care management system. May considering d/c amlodipine if continues to stay on lower side  2. Chronic obstructive pulmonary disease, unspecified COPD type ([place]) Stale, continue inhalers as prescribed  3. Mixed hyperlipidemia Stable, Continue lipitor  4. Alzheimer disease ([place]) Continue current medications  5. Atherosclerosis of aorta ([facility]) Continue lipitor  6. History of stroke Continue current medications  7. Needs flu shot - Flu Vaccine MDCK QUAD PF   General Counseling: Vershawn verbalizes understanding of the findings of todays visit and agrees with plan of treatment. I have discussed any further diagnostic evaluation that may be needed or ordered today. We also reviewed his medications today. he has been encouraged to call the office with any questions or concerns that should arise related to todays visit.    Orders Placed This Encounter  Procedures   Flu Vaccine MDCK QUAD PF    No orders of the defined types were placed in this encounter.   This patient was seen by Drema Dallas, PA-C in collaboration with Dr. Clayborn Bigness as a part of collaborative care agreement.   Total time spent:30 Minutes Time spent includes review of chart, medications, test results, and follow up plan with the patient.      Dr Lavera Guise Internal medicine

## 2021-09-08 ENCOUNTER — Other Ambulatory Visit: Payer: Self-pay

## 2021-09-08 DIAGNOSIS — F321 Major depressive disorder, single episode, moderate: Secondary | ICD-10-CM

## 2021-09-08 MED ORDER — MIRTAZAPINE 7.5 MG PO TABS: ORAL_TABLET | ORAL | 1 refills | Status: DC

## 2021-09-10 ENCOUNTER — Telehealth: Payer: Self-pay | Admitting: Pharmacist

## 2021-09-10 ENCOUNTER — Ambulatory Visit: Payer: Medicare Other | Admitting: Gastroenterology

## 2021-09-10 NOTE — Progress Notes (Addendum)
    Chronic Care Management Pharmacy Assistant   Name: Ian Conley  MRN: [id number] DOB: [birth year]  Reason for Encounter: Medication Review/Medication Coordination Call  Medications: Outpatient Encounter Medications as of 09/10/2021  Medication Sig   amLODipine (NORVASC) 5 MG tablet Take 1 tablet (5 mg total) by mouth daily.   atorvastatin (LIPITOR) 80 MG tablet TAKE 1 TABLET(80 MG) BY MOUTH DAILY AT 6 PM   CLARITIN 10 MG CAPS Take by mouth daily.   dipyridamole-aspirin (AGGRENOX) 200-25 MG 12hr capsule Take 1 capsule by mouth 2 (two) times daily.   donepezil (ARICEPT) 10 MG tablet Take 1 tablet (10 mg total) by mouth daily.   Fluticasone-Umeclidin-Vilant (TRELEGY ELLIPTA) 100-62.5-25 MCG/INH AEPB Inhale 1 puff into the lungs daily.   LORazepam (ATIVAN) 0.5 MG tablet Take 1 tab in the AM and 0.5 tab in PM for agitation   memantine (NAMENDA) 10 MG tablet Take 1 tablet (10 mg total) by mouth 2 (two) times daily.   mirtazapine (REMERON) 7.5 MG tablet TAKE 1 TABLET(7.5 MG) BY MOUTH AT BEDTIME   Multiple Vitamins-Minerals (YOUR LIFE MULTI MENS 50+) TABS Take by mouth.   polyethylene glycol powder (GLYCOLAX/MIRALAX) powder as needed.    No facility-administered encounter medications on file as of 09/10/2021.   Reviewed chart for medication changes ahead of medication coordination call.  No Consults, or hospital visits since last care coordination call.  Office Visit: 08/28/21 Mylinda Latina, PA-C. For follow-up. No medication changes.  06/24/21 Dr. Humphrey Rolls For acute visit. STARTED Fluticasone-Umeclidine-Vilant 100-62.5-25 MCG/INH 1 puff inhalation daily.   BP Readings from Last 3 Encounters:  08/28/21 100/62  06/24/21 108/62  04/21/21 128/65    Lab Results  Component Value Date   HGBA1C 6.2 (H) 07/15/2018     Patient obtains medications through Adherence Packaging  90 Days   Last adherence delivery included: Amodipine 5 mg 1 tablet for breakfast  Atorvastatin 80 mg 1 tablet at  bedtime Aggrenox 200/25 mg 1 tablet at breakfast and 1 tablet at bedtime Donepezil 10 mg 1 tablet at bedtime Memantine 10 mg 1 tablet breakfast and 1 tablet at bedtime  Mirtazapine 7.5 mg 1 tablet bedtime  Lorazepam 0.5 mg 1 tablet at breakfast and 1/2 (Pre-Cut) tablet at bedtime Claritin 10 mg 1 tablet for breakfast Your life Mulitple Men 1 tabet for breakfast  Patient declined meds last month: None.  Patient is due for next adherence delivery on: 09/19/21.  Called patient and reviewed medications and coordinated delivery.  This delivery to include: Amodipine 5 mg 1 tablet for breakfast  Atorvastatin 80 mg 1 tablet at bedtime Aggrenox 200/25 mg 1 tablet at breakfast and 1 tablet at bedtime Donepezil 10 mg 1 tablet at bedtime Memantine 10 mg 1 tablet breakfast and 1 tablet at bedtime  Mirtazapine 7.5 mg 1 tablet bedtime  Lorazepam 0.5 mg 1 tablet at breakfast and 1/2 (Pre-Cut) tablet at bedtime Claritin 10 mg 1 tablet for breakfast Your life Mulitple Men 1 tabet for breakfast  Patient declined the following medications: None.   Patient needs refills [id number] 0.5 mg 1 tablet at breakfast and 1/2 (Pre-Cut) tablet at bedtime-Requested.   Confirmed delivery date of 09/19/21, advised patient that pharmacy will contact them the morning of delivery.  Follow-Up:Pharmacist Review  Charlann Lange, RMA Clinical Pharmacist Assistant [phone]  10 minutes spent in review, coordination, and documentation.  Refills submitted to pharmacy.  Confirmed matched current chart information.  Reviewed by: Beverly Milch, PharmD Clinical Pharmacist [phone]

## 2021-09-15 ENCOUNTER — Other Ambulatory Visit: Payer: Self-pay | Admitting: Physician Assistant

## 2021-09-15 DIAGNOSIS — F0151 Vascular dementia with behavioral disturbance: Secondary | ICD-10-CM

## 2021-09-15 DIAGNOSIS — F01518 Vascular dementia, unspecified severity, with other behavioral disturbance: Secondary | ICD-10-CM

## 2021-09-15 MED ORDER — ATORVASTATIN CALCIUM 80 MG PO TABS: ORAL_TABLET | ORAL | 1 refills | Status: DC

## 2021-09-15 MED ORDER — LORAZEPAM 0.5 MG PO TABS: ORAL_TABLET | ORAL | 0 refills | Status: DC

## 2021-09-15 NOTE — Telephone Encounter (Signed)
Last 9/22 can you please send

## 2021-09-19 ENCOUNTER — Telehealth: Payer: Self-pay | Admitting: Pharmacist

## 2021-09-19 DIAGNOSIS — I10 Essential (primary) hypertension: Secondary | ICD-10-CM | POA: Diagnosis not present

## 2021-09-19 DIAGNOSIS — M1611 Unilateral primary osteoarthritis, right hip: Secondary | ICD-10-CM | POA: Diagnosis not present

## 2021-09-19 DIAGNOSIS — J439 Emphysema, unspecified: Secondary | ICD-10-CM | POA: Diagnosis not present

## 2021-09-19 NOTE — Chronic Care Management (AMB) (Signed)
Chronic Care Management   Outreach Note  09/19/2021 Name: Ian Conley MRN: [id number] DOB: 03/08/1951  Referred by: Lavera Guise, MD Reason for referral : Chronic Care Management (RPM Monthly Report)    [name]  Name: Ian Conley  Date of Birth: 07-05-1951  Gender: Male  Primary provider: Clayborn Bigness  RPM Coordinator: Charlann Lange  ICD Code: I10 Device ID: [id number] [place]: [employer] Measurement Type: blood pressure Days of Monitoring: 21 Documentation Time: 23 Min 0 Sec BLOOD PRESSURE  Measurement Date: 09/17/2021 Wednesday at 23:55 PM Systolic / Diastolic (mmHg): 732 / 75 Measurement Date: 09/14/2021 Sunday at 20:25 PM Systolic / Diastolic (mmHg): 427 / 60 Measurement Date: 09/13/2021 Saturday at 06:23 PM Systolic / Diastolic (mmHg): 762 / 67 Measurement Date: 09/12/2021 Friday at 83:15 PM Systolic / Diastolic (mmHg): 176 / 69 Measurement Date: 09/09/2021 Tuesday at 16:07 PM Systolic / Diastolic (mmHg): 371 / 67 Measurement Date: 09/08/2021 Monday at 06:26 PM Systolic / Diastolic (mmHg): 948 / 59 Measurement Date: 09/05/2021 Friday at 54:62 PM Systolic / Diastolic (mmHg): 703 / 68 Measurement Date: 09/04/2021 Thursday at 50:09 PM Systolic / Diastolic (mmHg): 381 / 61 Measurement Date: 09/03/2021 Wednesday at 82:99 PM Systolic / Diastolic (mmHg): 371 / 63 Measurement Date: 09/02/2021 Tuesday at 69:67 PM Systolic / Diastolic (mmHg): 893 / 61 Measurement Date: 09/01/2021 Monday at 81:01 PM Systolic / Diastolic (mmHg): 751 / 61 Measurement Date: 08/31/2021 Sunday at 02:58 PM Systolic / Diastolic (mmHg): 527 / 67 Measurement Date: 08/30/2021 Saturday at 78:24 PM Systolic / Diastolic (mmHg): 235 / 60 Measurement Date: 08/29/2021 Friday at 36:14 PM Systolic / Diastolic (mmHg): 431 / 61 Measurement Date: 08/28/2021 Thursday at 54:00 AM Systolic / Diastolic (mmHg): 867 / 66 Measurement Date: 08/27/2021 Wednesday at 61:95 PM Systolic / Diastolic (mmHg): 093 /  63 Measurement Date: 08/26/2021 Tuesday at 26:71 PM Systolic / Diastolic (mmHg): 245 / 62 Measurement Date: 08/26/2021 Tuesday at 80:99 PM Systolic / Diastolic (mmHg): 833 / 62 Measurement Date: 08/25/2021 Monday at 82:50 PM Systolic / Diastolic (mmHg): 539 / 60 Measurement Date: 08/24/2021 Sunday at 76:73 PM Systolic / Diastolic (mmHg): 98 / 56 Measurement Date: 08/23/2021 Saturday at 41:93 PM Systolic / Diastolic (mmHg): 790 / 56 Measurement Date: 08/22/2021 Friday at 24:09 PM Systolic / Diastolic (mmHg): 735 / 61  PULSE  Measurement Date: 09/17/2021 Wednesday at 12:01 PM Pulse (IN BPM): 66 Measurement Date: 09/14/2021 Sunday at 08:12 PM Pulse (IN BPM): 61 Measurement Date: 09/13/2021 Saturday at 08:12 PM Pulse (IN BPM): 68 Measurement Date: 09/12/2021 Friday at 02:11 PM Pulse (IN BPM): 69 Measurement Date: 09/09/2021 Tuesday at 03:25 PM Pulse (IN BPM): 74 Measurement Date: 09/08/2021 Monday at 04:32 PM Pulse (IN BPM): 63 Measurement Date: 09/05/2021 Friday at 03:19 PM Pulse (IN BPM): 68 Measurement Date: 09/04/2021 Thursday at 07:39 PM Pulse (IN BPM): 69 Measurement Date: 09/03/2021 Wednesday at 02:41 PM Pulse (IN BPM): 65 Measurement Date: 09/02/2021 Tuesday at 09:27 PM Pulse (IN BPM): 68 Measurement Date: 09/01/2021 Monday at 01:30 PM Pulse (IN BPM): 70 Measurement Date: 08/31/2021 Sunday at 06:58 PM Pulse (IN BPM): 67 Measurement Date: 08/30/2021 Saturday at 08:47 PM Pulse (IN BPM): 64 Measurement Date: 08/29/2021 Friday at 09:07 PM Pulse (IN BPM): 62 Measurement Date: 08/28/2021 Thursday at 11:19 AM Pulse (IN BPM): 89 Measurement Date: 08/27/2021 Wednesday at 01:50 PM Pulse (IN BPM): 67 Measurement Date: 08/26/2021 Tuesday at 07:53 PM Pulse (IN BPM): 66 Measurement Date: 08/26/2021 Tuesday at 07:53 PM Pulse (IN BPM): 66 Measurement Date: 08/25/2021  Monday at 06:38 PM Pulse (IN BPM): 66 Measurement Date: 08/24/2021 Sunday at 03:21 PM Pulse (IN BPM): 62 Measurement Date: 08/23/2021  Saturday at 02:38 PM Pulse (IN BPM): 65 Measurement Date: 08/22/2021 Friday at 12:47 PM Pulse (IN BPM): 73        Notes for September-2022 : 61 Min 0 Sec  Date and Time: 09/19/2021 Friday at 10:29 AM User: Leata Mouse Time logged: 05:00 Notes: DATA REVIEW SYSTOLIC BP  Automatically transmitted data is reviewed today from device. (ID: [id number]).  Systolic BP reading is >329 for 0 % of time  Systolic BP reading is between 160 to 179 for 0 % of time  Systolic BP reading is between 140 to 159 for 0 % of time  Systolic BP reading is between 120-139 for 22.73 % of time  Systolic BP reading is between 101-120 for 72.73 % of time  Systolic BP reading is <924 for 4.55 % of time  DIASTOLIC BP  Diastolic BP reading is elevated >100 for 0 % of time  Diastolic BP reading is between 90-99 for 0 % of time  PULSE  Pulse is >120 for 0 % of time  Pulse is <50 for 0 % of time  COMPLIANCE NOTES BLOOD PRESSURE  Patient is compliant - 16 days of readings obtained.  MANAGEMENT NOTES We will continue to monitor BP readings for now  Medications for BP reviewed for this patient  No change on the treatment plan   Date and Time: 09/17/2021 Wednesday at 04:20 PM User: Charlann Lange Time logged: 02:00 Notes: REVIEW NOTES All the daily Blood pressure data noted since last review  Heart rate/Pulse data reviewed   Date and Time: 09/15/2021 Monday at 03:18 PM User: Charlann Lange Time logged: 04:00 Notes: REVIEW NOTES All the daily Blood pressure data noted since last review  Heart rate/Pulse data reviewed   Date and Time: 09/12/2021 Friday at 03:33 PM User: Charlann Lange Time logged: 02:00 Notes: REVIEW NOTES All the daily Blood pressure data noted since last review  Heart rate/Pulse data reviewed   Date and Time: 09/09/2021 Tuesday at 04:00 PM User: Charlann Lange Time logged: 04:00 Notes: REVIEW NOTES All the daily Blood pressure data  noted since last review  Heart rate/Pulse data reviewed   Date and Time: 09/05/2021 Friday at 04:14 PM User: Charlann Lange Time logged: 04:00 Notes: REVIEW NOTES All the daily Blood pressure data noted since last review  Heart rate/Pulse data reviewed   Date and Time: 09/03/2021 Wednesday at 04:30 PM User: Charlann Lange Time logged: 04:00 Notes: REVIEW NOTES All the daily Blood pressure data noted since last review  Heart rate/Pulse data reviewed   Date and Time: 09/01/2021 Monday at 04:09 PM User: Charlann Lange Time logged: 08:00 Notes: REVIEW NOTES All the daily Blood pressure data noted since last review  Heart rate/Pulse data reviewed   Date and Time: 08/28/2021 Thursday at 03:23 PM User: Charlann Lange Time logged: 02:00 Notes: REVIEW NOTES All the daily Blood pressure data noted since last review  Heart rate/Pulse data reviewed   Date and Time: 08/26/2021 Tuesday at 04:19 PM User: Charlann Lange Time logged: 06:00 Notes: REVIEW NOTES All the daily Blood pressure data noted since last review  Heart rate/Pulse data reviewed   Date and Time: 08/22/2021 Friday at 03:46 PM User: Charlann Lange Time logged: 02:00 Notes: REVIEW NOTES All the daily Blood pressure data noted since last review  Heart rate/Pulse data reviewed  Verbal Consent Obtained on March 27, 2021.  Patient was notified about Remote Patient Monitoring (RPM) program offered by our medical practice to improve the patient care, co-ordination of care, patient education, provide quality of care, reduce the cost and provide personalized care. Patient was informed that this is Medicare approved program and can be furnished by only one provider, can be stopped by the patient anytime. Patient has verbally consented to enroll in this program and our designated care coordinator to contact by phone as needed at least for 20 minutes a month. Practice also provides 24/7 care to the  patient needs. All patient questions were answered and patient was enrolled in the RPM program.

## 2021-10-20 ENCOUNTER — Telehealth: Payer: Self-pay | Admitting: Pharmacist

## 2021-10-20 DIAGNOSIS — I10 Essential (primary) hypertension: Secondary | ICD-10-CM | POA: Diagnosis not present

## 2021-10-20 DIAGNOSIS — M1611 Unilateral primary osteoarthritis, right hip: Secondary | ICD-10-CM | POA: Diagnosis not present

## 2021-10-20 DIAGNOSIS — J439 Emphysema, unspecified: Secondary | ICD-10-CM | POA: Diagnosis not present

## 2021-10-20 NOTE — Chronic Care Management (AMB) (Signed)
Chronic Care Management   Outreach Note  10/20/2021 Name: Ian Conley MRN: [id number] DOB: [birth year]  Referred by: Lavera Guise, MD Reason for referral : No chief complaint on file.      [name]  Name: Ian Conley  Date of Birth: 07-Jun-1951  Gender: Male  Primary provider: Clayborn Bigness  RPM Coordinator: Charlann Lange  ICD Code: I10 Device ID: [id number] [place]: [facility] Measurement Type: blood pressure Days of Monitoring: 18 Documentation Time: 40 Min 0 Sec BLOOD PRESSURE  Measurement Date: 10/18/2021 Saturday at 76:22 PM Systolic / Diastolic (mmHg): 633 / 71 Measurement Date: 10/15/2021 Wednesday at 35:45 PM Systolic / Diastolic (mmHg): 625 / 87 Measurement Date: 10/14/2021 Tuesday at 63:89 PM Systolic / Diastolic (mmHg): 373 / 67 Measurement Date: 10/12/2021 Sunday at 42:87 PM Systolic / Diastolic (mmHg): 681 / 60 Measurement Date: 10/10/2021 Friday at 15:72 PM Systolic / Diastolic (mmHg): 620 / 66 Measurement Date: 10/08/2021 Wednesday at 35:59 PM Systolic / Diastolic (mmHg): 741 / 68 Measurement Date: 10/06/2021 Monday at 63:84 AM Systolic / Diastolic (mmHg): 536 / 84 Measurement Date: 10/05/2021 Sunday at 46:80 PM Systolic / Diastolic (mmHg): 321 / 62 Measurement Date: 10/03/2021 Friday at 22:48 PM Systolic / Diastolic (mmHg): 250 / 71 Measurement Date: 10/02/2021 Thursday at 03:70 PM Systolic / Diastolic (mmHg): 488 / 78 Measurement Date: 09/30/2021 Tuesday at 89:16 PM Systolic / Diastolic (mmHg): 945 / 64 Measurement Date: 09/29/2021 Monday at 03:88 PM Systolic / Diastolic (mmHg): 828 / 61 Measurement Date: 09/26/2021 Friday at 00:34 PM Systolic / Diastolic (mmHg): 917 / 72 Measurement Date: 09/25/2021 Thursday at 91:50 PM Systolic / Diastolic (mmHg): 569 / 64 Measurement Date: 09/23/2021 Tuesday at 79:48 PM Systolic / Diastolic (mmHg): 016 / 72 Measurement Date: 09/22/2021 Monday at 55:37 PM Systolic / Diastolic (mmHg): 482 / 71 Measurement  Date: 09/21/2021 Sunday at 70:78 PM Systolic / Diastolic (mmHg): 675 / 60 Measurement Date: 09/20/2021 Saturday at 44:92 PM Systolic / Diastolic (mmHg): 010 / 67  PULSE  Measurement Date: 10/18/2021 Saturday at 01:29 PM Pulse (IN BPM): 69 Measurement Date: 10/15/2021 Wednesday at 12:21 PM Pulse (IN BPM): 68 Measurement Date: 10/14/2021 Tuesday at 07:41 PM Pulse (IN BPM): 68 Measurement Date: 10/12/2021 Sunday at 07:43 PM Pulse (IN BPM): 66 Measurement Date: 10/10/2021 Friday at 12:52 PM Pulse (IN BPM): 72 Measurement Date: 10/08/2021 Wednesday at 08:30 PM Pulse (IN BPM): 60 Measurement Date: 10/06/2021 Monday at 11:06 AM Pulse (IN BPM): 68 Measurement Date: 10/05/2021 Sunday at 05:47 PM Pulse (IN BPM): 75 Measurement Date: 10/03/2021 Friday at 01:53 PM Pulse (IN BPM): 71 Measurement Date: 10/02/2021 Thursday at 01:55 PM Pulse (IN BPM): 72 Measurement Date: 09/30/2021 Tuesday at 01:09 PM Pulse (IN BPM): 75 Measurement Date: 09/29/2021 Monday at 08:12 PM Pulse (IN BPM): 69 Measurement Date: 09/26/2021 Friday at 04:01 PM Pulse (IN BPM): 67 Measurement Date: 09/25/2021 Thursday at 08:10 PM Pulse (IN BPM): 66 Measurement Date: 09/23/2021 Tuesday at 01:30 PM Pulse (IN BPM): 71 Measurement Date: 09/22/2021 Monday at 04:34 PM Pulse (IN BPM): 69 Measurement Date: 09/21/2021 Sunday at 06:02 PM Pulse (IN BPM): 66 Measurement Date: 09/20/2021 Saturday at 08:13 PM Pulse (IN BPM): 75        Notes for October-2022 : 40 Min 0 Sec  Date and Time: 10/20/2021 Monday at 10:45 AM User: Leata Mouse Time logged: 08:00 Notes: DATA REVIEW SYSTOLIC BP  Automatically transmitted data is reviewed today from device. (ID: [id number]).  Systolic BP reading is >832 for 0 % of time  Systolic BP reading is between 160 to 179 for 0 % of time  Systolic BP reading is between 140 to 159 for 11.11 % of time  Systolic BP reading is between 120-139 for 66.67 % of time  Systolic BP reading is between  101-120 for 27.78 % of time  Systolic BP reading is <119 for 0 % of time  DIASTOLIC BP  Diastolic BP reading is elevated >100 for 0 % of time  Diastolic BP reading is between 90-99 for 0 % of time  PULSE  Pulse is >120 for 0 % of time  Pulse is <50 for 0 % of time  COMPLIANCE NOTES BLOOD PRESSURE  Patient is compliant - 16 days of readings obtained.  MANAGEMENT NOTES We will continue to monitor BP readings for now  Medications for BP reviewed for this patient  No change on the treatment plan   Date and Time: 10/15/2021 Wednesday at 04:27 PM User: Charlann Lange Time logged: 04:00 Notes: REVIEW NOTES All the daily Blood pressure data noted since last review  Heart rate/Pulse data reviewed   Date and Time: 10/13/2021 Monday at 04:19 PM User: Charlann Lange Time logged: 02:00 Notes: REVIEW NOTES All the daily Blood pressure data noted since last review  Heart rate/Pulse data reviewed   Date and Time: 10/10/2021 Friday at 04:02 PM User: Charlann Lange Time logged: 02:00 Notes: REVIEW NOTES All the daily Blood pressure data noted since last review  Heart rate/Pulse data reviewed   Date and Time: 10/09/2021 Thursday at 04:19 PM User: Charlann Lange Time logged: 02:00 Notes: REVIEW NOTES All the daily Blood pressure data noted since last review  Heart rate/Pulse data reviewed   Date and Time: 10/06/2021 Monday at 04:17 PM User: Charlann Lange Time logged: 04:00 Notes: REVIEW NOTES All the daily Blood pressure data noted since last review  Heart rate/Pulse data reviewed   Date and Time: 10/03/2021 Friday at 04:19 PM User: Charlann Lange Time logged: 02:00 Notes: REVIEW NOTES All the daily Blood pressure data noted since last review  Heart rate/Pulse data reviewed   Date and Time: 10/02/2021 Thursday at 04:19 PM User: Charlann Lange Time logged: 02:00 Notes: REVIEW NOTES All the daily Blood pressure data noted  since last review  Heart rate/Pulse data reviewed   Date and Time: 09/30/2021 Tuesday at 04:27 PM User: Charlann Lange Time logged: 04:00 Notes: REVIEW NOTES All the daily Blood pressure data noted since last review  Heart rate/Pulse data reviewed   Date and Time: 09/26/2021 Friday at 04:10 PM User: Charlann Lange Time logged: 04:00 Notes: REVIEW NOTES All the daily Blood pressure data noted since last review  Heart rate/Pulse data reviewed   Date and Time: 09/23/2021 Tuesday at 02:22 PM User: Charlann Lange Time logged: 02:00 Notes: REVIEW NOTES All the daily Blood pressure data noted since last review  Heart rate/Pulse data reviewed   Date and Time: 09/22/2021 Monday at 04:07 PM User: Charlann Lange Time logged: 04:00 Notes: REVIEW NOTES All the daily Blood pressure data noted since last review  Heart rate/Pulse data reviewed  Verbal Consent Obtained on March 27, 2021. Patient was notified about Remote Patient Monitoring (RPM) program offered by our medical practice to improve the patient care, co-ordination of care, patient education, provide quality of care, reduce the cost and provide personalized care. Patient was informed that this is Medicare approved program and can be furnished by only one provider, can be stopped by the patient anytime. Patient has verbally consented to enroll  in this program and our designated care coordinator to contact by phone as needed at least for 20 minutes a month. Practice also provides 24/7 care to the patient needs. All patient questions were answered and patient was enrolled in the RPM program.

## 2021-10-30 ENCOUNTER — Ambulatory Visit: Payer: Medicare Other | Admitting: Nurse Practitioner

## 2021-10-31 ENCOUNTER — Telehealth: Payer: Self-pay
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
## 2021-11-18 ENCOUNTER — Telehealth: Payer: Self-pay | Admitting: Student-PharmD

## 2021-11-18 NOTE — Chronic Care Management (AMB) (Signed)
Chronic Care Management   Pharmacy Note  11/18/2021 Name: Ian Conley MRN: [id number] DOB: 10-17-1951  Referred by: Ian Guise, MD Reason for referral : RPM Monthly Review     [place]  Name: Ian Conley  Date of Birth: 1951-04-25  Gender: Male  Primary provider: Clayborn Bigness  RPM Coordinator: Charlann Lange  ICD Code: I10 Device ID: [id number] [place]: [employer] Measurement Type: blood pressure Days of Monitoring: 18 Documentation Time: 48 Min 0 Sec BLOOD PRESSURE  Measurement Date: 11/17/2021 Monday at 97:98 AM Systolic / Diastolic (mmHg): 921 / 86 Measurement Date: 11/17/2021 Monday at 19:41 AM Systolic / Diastolic (mmHg): 740 / 86 Measurement Date: 11/15/2021 Saturday at 81:44 PM Systolic / Diastolic (mmHg): 818 / 68 Measurement Date: 11/14/2021 Friday at 56:31 AM Systolic / Diastolic (mmHg): 497 / 85 Measurement Date: 11/13/2021 Thursday at 02:63 PM Systolic / Diastolic (mmHg): 785 / 76 Measurement Date: 11/11/2021 Tuesday at 88:50 PM Systolic / Diastolic (mmHg): 277 / 75 Measurement Date: 11/10/2021 Monday at 41:28 PM Systolic / Diastolic (mmHg): 786 / 63 Measurement Date: 11/09/2021 Sunday at 76:72 PM Systolic / Diastolic (mmHg): 094 / 68 Measurement Date: 11/08/2021 Saturday at 70:96 PM Systolic / Diastolic (mmHg): 283 / 76 Measurement Date: 11/07/2021 Friday at 66:29 PM Systolic / Diastolic (mmHg): 476 / 70 Measurement Date: 11/05/2021 Wednesday at 54:65 PM Systolic / Diastolic (mmHg): 035 / 65 Measurement Date: 11/04/2021 Tuesday at 46:56 PM Systolic / Diastolic (mmHg): 812 / 62 Measurement Date: 11/03/2021 Monday at 75:17 PM Systolic / Diastolic (mmHg): 001 / 70 Measurement Date: 11/02/2021 Sunday at 74:94 PM Systolic / Diastolic (mmHg): 496 / 66 Measurement Date: 10/31/2021 Friday at 75:91 PM Systolic / Diastolic (mmHg): 638 / 59 Measurement Date: 10/30/2021 Thursday at 46:65 PM Systolic / Diastolic (mmHg): 993 / 60 Measurement Date:  10/28/2021 Tuesday at 57:01 PM Systolic / Diastolic (mmHg): 779 / 77 Measurement Date: 10/25/2021 Saturday at 39:03 PM Systolic / Diastolic (mmHg): 009 / 62 Measurement Date: 10/24/2021 Friday at 23:30 PM Systolic / Diastolic (mmHg): 076 / 69  PULSE  Measurement Date: 11/17/2021 Monday at 11:51 AM Pulse (IN BPM): 72 Measurement Date: 11/17/2021 Monday at 11:51 AM Pulse (IN BPM): 72 Measurement Date: 11/15/2021 Saturday at 06:13 PM Pulse (IN BPM): 71 Measurement Date: 11/14/2021 Friday at 10:43 AM Pulse (IN BPM): 72 Measurement Date: 11/13/2021 Thursday at 02:54 PM Pulse (IN BPM): 64 Measurement Date: 11/11/2021 Tuesday at 03:36 PM Pulse (IN BPM): 78 Measurement Date: 11/10/2021 Monday at 09:11 PM Pulse (IN BPM): 73 Measurement Date: 11/09/2021 Sunday at 08:18 PM Pulse (IN BPM): 69 Measurement Date: 11/08/2021 Saturday at 09:22 PM Pulse (IN BPM): 67 Measurement Date: 11/07/2021 Friday at 03:29 PM Pulse (IN BPM): 72 Measurement Date: 11/05/2021 Wednesday at 09:06 PM Pulse (IN BPM): 67 Measurement Date: 11/04/2021 Tuesday at 09:40 PM Pulse (IN BPM): 67 Measurement Date: 11/03/2021 Monday at 02:03 PM Pulse (IN BPM): 68 Measurement Date: 11/02/2021 Sunday at 06:35 PM Pulse (IN BPM): 64 Measurement Date: 10/31/2021 Friday at 07:39 PM Pulse (IN BPM): 66 Measurement Date: 10/30/2021 Thursday at 05:44 PM Pulse (IN BPM): 75 Measurement Date: 10/28/2021 Tuesday at 04:18 PM Pulse (IN BPM): 69 Measurement Date: 10/25/2021 Saturday at 01:50 PM Pulse (IN BPM): 68 Measurement Date: 10/24/2021 Friday at 08:24 PM Pulse (IN BPM): 61        Notes for November-2022 : 42 Min 0 Sec  Date and Time: 11/18/2021 Tuesday at 01:49 PM User: Alena Bills Time logged: 04:00 Notes: DATA REVIEW SYSTOLIC BP  Automatically transmitted data is reviewed today from device. (ID: [id number]).  Systolic BP reading is >761 for 0 % of time  Systolic BP reading is between 160 to 179 for 0 % of  time  Systolic BP reading is between 140 to 159 for 21.05 % of time  Systolic BP reading is between 120-139 for 57.89 % of time  Systolic BP reading is between 101-120 for 21.05 % of time  Systolic BP reading is <607 for 0 % of time  DIASTOLIC BP  Diastolic BP reading is elevated >100 for 0 % of time  Diastolic BP reading is between 90-99 for 0 % of time  PULSE  Pulse is >120 for 0 % of time  Pulse is <50 for 0 % of time  COMPLIANCE NOTES BLOOD PRESSURE  Patient is compliant - 16 days of readings obtained.  MANAGEMENT NOTES We will continue to monitor BP readings for now  Medications for BP reviewed for this patient  No change on the treatment plan   Date and Time: 11/18/2021 Tuesday at 09:53 AM User: Charlann Lange Time logged: 04:00 Notes: REVIEW NOTES All the daily Blood pressure data noted since last review  Heart rate/Pulse data reviewed   Date and Time: 11/17/2021 Monday at 08:51 AM User: Charlann Lange Time logged: 08:00 Notes: COMPLIANCE NOTES BLOOD PRESSURE  Patient is compliant - 16 days of readings obtained.  REVIEW NOTES All the daily Blood pressure data noted since last review  Heart rate/Pulse data reviewed   Date and Time: 11/11/2021 Tuesday at 04:13 PM User: Charlann Lange Time logged: 04:00 Notes: REVIEW NOTES All the daily Blood pressure data noted since last review  Heart rate/Pulse data reviewed   Date and Time: 11/10/2021 Monday at 03:28 PM User: Charlann Lange Time logged: 04:00 Notes:  REVIEW NOTES All the daily Blood pressure data noted since last review  Heart rate/Pulse data reviewed   Date and Time: 11/07/2021 Friday at 04:13 PM User: Charlann Lange Time logged: 02:00 Notes: REVIEW NOTES All the daily Blood pressure data noted since last review  Heart rate/Pulse data reviewed   Date and Time: 11/05/2021 Wednesday at 04:07 PM User: Charlann Lange Time logged: 02:00 Notes: REVIEW  NOTES All the daily Blood pressure data noted since last review  Heart rate/Pulse data reviewed   Date and Time: 11/03/2021 Monday at 04:33 PM User: Charlann Lange Time logged: 08:00 Notes: REVIEW NOTES All the daily Blood pressure data noted since last review  Heart rate/Pulse data reviewed   Date and Time: 10/28/2021 Tuesday at 04:39 PM User: Charlann Lange Time logged: 02:00 Notes: REVIEW NOTES All the daily Blood pressure data noted since last review  Heart rate/Pulse data reviewed   Date and Time: 10/27/2021 Monday at 04:21 PM User: Charlann Lange Time logged: 04:00 Notes: REVIEW NOTES All the daily Blood pressure data noted since last review  Heart rate/Pulse data reviewed  Verbal Consent Obtained on March 27, 2021. Patient was notified about Remote Patient Monitoring (RPM) program offered by our medical practice to improve the patient care, co-ordination of care, patient education, provide quality of care, reduce the cost and provide personalized care. Patient was informed that this is Medicare approved program and can be furnished by only one provider, can be stopped by the patient anytime. Patient has verbally consented to enroll in this program and our designated care coordinator to contact by phone as needed at least for 20 minutes a month. Practice also provides 24/7 care to the patient needs.  All patient questions were answered and patient was enrolled in the RPM program.

## 2021-12-03 ENCOUNTER — Other Ambulatory Visit: Payer: Self-pay

## 2021-12-03 ENCOUNTER — Ambulatory Visit: Payer: Medicare Other | Admitting: Student-PharmD

## 2021-12-03 DIAGNOSIS — I10 Essential (primary) hypertension: Secondary | ICD-10-CM

## 2021-12-03 DIAGNOSIS — J449 Chronic obstructive pulmonary disease, unspecified: Secondary | ICD-10-CM

## 2021-12-03 NOTE — Progress Notes (Signed)
Follow Up CCM Pharmacist Visit   Ian Conley, Ian Conley [id number] 89 years, Male  DOB: 1951/04/20  M: [phone]  Summary: Patient has RPM device for BP and readings are WNL. Patient has not been taking Trelegy because his "breathing has been good." Reminded him to take daily.  Recommendations/Changes made from today's visit: Being taking Trelegy once daily as prescribed. Continue BP RPM  Plan: F/U 05/13/22   Patient Chart Prep  Completed by Charlann Lange on 12/01/2021  Chronic Conditions Patient's Chronic Conditions: Hypertension (HTN), Chronic Obstructive Pulmonary Disease (COPD), Dementia, Tobacco Use, Osteoarthritis  Doctor and Hospital Visits Were there PCP Visits since last visit with the Pharmacist?: Yes Visit #1: 08/28/21 Mylinda Latina, PA-C. For immunizations. No medication changes.  Were there Specialist Visits since last visit with the Pharmacist?: No Was there a Hospital Visit in last 30 days?: No Were there other Hospital Visits since last visit with the Pharmacist?: No  Medication Information Have there been any medication changes from PCP or Specialist since last visit with the Pharmacist?: No Are there any Medication adherence gaps (beyond 5 days past due)?: No Medication adherence rates for the STAR rating drugs: Atorvastatin 80 mg 90 DS 09/15/21 List Patient's current Care Gaps: No current Care Gaps identified  Pre-Call Questions  Are you able to connect with Patient?: Yes Visit Type: Phone Call May we confirm what is the best phone # for the pharmacist to call you?: [phone] Have you been in the hospital or emergency room recently?: No Do you have any questions or concerns that you want to make sure your pharmacist addresses with your during your appointment?: Yes Details: Patient sister stated her brother does get combative sometimes What, if any, problems do you have getting your medications from the pharmacy?: None Is there anything else that  you would like me to pass along to your pharmacist or PCP?: Yes Details: Patient sister stated it is hard for her to get her brother to come to his doctors appointments and is concerned about his medication refills. Patient reminded to bring medication bottles to visit (not a list of meds) OR have medication bottles pulled out prior to appointment time: Done   Disease Assessments  Subjective Information Current BP: 100/62 Current HR: 79 taken on: 08/27/2021 Weight: 144 BMI: 20.08 kg/m Last GFR: 79 taken on: 06/23/2021  Hypertension (HTN) Assess this condition today?: Yes Is patient able to obtain BP reading today?: Yes BP today is: 127/74 Heart Rate is: 69 Goal: <130/80 mmHG Hypertension Stage: Pre-HTN (SBP: 120-129 and DBP < 80) Is Patient checking BP at home?: Yes Patient home BP readings are ranging: patient has RPM device, all readings range 112-145/61-75 How often does patient miss taking their blood pressure medications?: No missed doses Has patient experienced hypotension, dizziness, falls or bradycardia?: No Does Patient use RPM device?: Yes BP RPM device: Does patient qualify?: Yes We discussed: DASH diet:  following a diet emphasizing fruits and vegetables and low-fat dairy products along with whole grains, fish, poultry, and nuts. Reducing red meats and sugars., Smoking cessation, Targeting 150 minutes of aerobic activity per week, Reducing the amount of salt intake to <1540m/per day., Recommend using a salt substitute to replace your salt if you need flavor. Assessment:: Controlled Drug: Amlodipine 547m1 tab daily Assessment: Appropriate, Effective, Safe, Accessible Plan to (other): Continue RPM device and medication therapy [facility] Follow up: 3/23 Pharmacist Follow up: 5/23  Chronic Obstructive Pulmonary Disease (COPD) Current Eosinophils: 0.0 taken on: 06/23/2021 Assess this condition  today?: Yes Gold group: A (low sx, < 2 exacerbations / yr) Exacerbations since  last visit with pharmacist?: No Has there been change in patients smoking/vaping habit since last visit with pharmacist?: No Home oxygen therapy: No Frequency of SABA/SAMA use: Never We discussed: Smoking cessation, Inhaler technique Assessment:: Controlled Drug: Trelegy 100-62.5-25 1 puff daily Assessment: Appropriate, Effective, Safe, Accessible Additional Info: Patient reports he has not been using because his breathing has been fine. Educated him on use of maintenance inhaler. Patient expressed understanding. Plan to (other): Continue current medication therapy. [place] Follow up: 3/23 Pharmacist Follow up: 5/23  Tobacco Use Disorder Assess this condition today?: Yes Has there been change in patients smoking/vaping habit since last visit with pharmacist?: No Is patient currently Smoking or Vaping?: Yes What do you like about smoking?: Gives something to do while bored at home What health problems has your smoking contributed to?: COPD, Blood pressure Have you tried to quit smoking since you last spoke with the pharmacist?: No What are your concerns about quitting?: Bored Assessment:: Uncontrolled Drug: N/A Additional Info: Patient states he would not like to quit right now (still smoking 5 cigarettes a day). Will f/u at next appt [facility] Follow up: 3/23 Pharmacist Follow up: 5/23  Exercise, Diet and Non-Drug Coordination Needs Additional exercise counseling points. We discussed: targeting at least 151 minutes per week of moderate-intensity aerobic exercise., decreasing sedentary behavior Additional diet counseling points. We discussed: key components of the DASH diet Discussed Non-Drug Care Coordination Needs: Yes Does Patient have Medication financial barriers?: No  Accountable Health Communities Health-Related Social Needs Screening Tool -  SDOH  What is your living situation today? (ref #1): I have a steady place to live Think about the place you live. Do you have problems with any  of the following? (ref #2): None of the above Within the past 12 months, you worried that your food would run out before you got money to buy more (ref #3): Never true Within the past 12 months, the food you bought just didn't last and you didn't have money to get more (ref #4): Never true In the past 12 months, has lack of reliable transportation kept you from medical appointments, meetings, work or from getting things needed for daily living? (ref #5): No In the past 12 months, has the electric, gas, oil, or water company threatened to shut off services in your home? (ref #6): No How often does anyone, including family and friends, physically hurt you? (ref #7): Never (1) How often does anyone, including family and friends, insult or talk down to you? (ref #8): Never (1) How often does anyone, including friends and family, threaten you with harm? (ref #9): Never (1) How often does anyone, including family and friends, scream or curse at you? (ref #10): Never (1)  COMPREHENSIVE CARE PLAN AND GOALS:     HYPERTENSION   OFFICE VISIT BLOOD PRESSURE:   127/74  HOME BLOOD PRESSURE MONITORING: RPM Device  MY GOAL BLOOD PRESSURE: <130/80 mmHg  CURRENT MEDICATION AND DOSING:  Amlodipine 63m 1 tablet daily  BARRIERS TO ACHIEVING GOALS: None identified, blood pressure is within goal   PLAN:  Continue current medications as prescribed.       ASPIRIN THERAPY   CURRENT ASPIRIN REGIMEN AND DOSING:  Aggrenox 200-269m1 tablet every 12 hours   THE GOALS WE HAVE CHOSEN ARE:  Monitor for symptoms of bleeding.     BARRIERS TO ACHIEVING GOALS: None identified   PLAN TO WORK ON  THESE GOALS:  Continue Aspirin therapy; Avoid over the counter medications that would increase risk of bleeding, such as NSAIDS (ibuprofen, naproxen, Goody's powder, others).    DEPRESSION/APPETITE  CURRENT MEDICATION AND DOSING:  Mirtazapine 7.59m - take 1 tablet by mouth at bedtime   THE GOALS WE HAVE CHOSEN ARE: Continue to see  improvement in depression/appetite symptoms.  BARRIERS TO ACHIEVING GOALS: None identified   PLAN TO WORK ON THESE GOALS:  Continue current medications as prescribed.     COPD  LAST SPIROMETRY SCORE/DATE:    N/A                                                     EOSINOPHIL COUNT:    EOS %:      02/12/21 0%                           EOS ABSOLUTE:  06/24/21 0  CURRENT MEDICATION AND DOSING:  Claritin 185m1 tablet daily Trelegy 100-62.5-25 1 puff daily  THE GOALS WE HAVE CHOSEN ARE: Prevent worsening of shortness of breath and hospitalizations.  BARRIERS TO ACHIEVING GOALS: Medication adherence  PLAN TO WORK ON THESE GOALS: Continue current medications as prescribed.     HEALTHY HABITS (Diet and exercise)  CURRENT DIET/EXERCISE: Walks around house some  THE GOALS WE HAVE CHOSEN ARE:   Stay active, engage in at least 150 minutes per week of moderate-intensity exercise such as brisk walking (15- to 20-minute mile) or something similar. Increase flexibility exercises, break up prolonged periods of sitting Increase seafood, lean meats, whole grains, legumes, nuts, fruits (for dessert), and vegetables. Limit salt intake, sugars, carbs, and red meat, refined and processed foods.  BARRIERS TO ACHIEVING GOALS:  None  PLAN TO WORK ON THESE GOALS:  Start chair exercises and try to walk around on commercial breaks   ACTIVE MEDICATION LIST  MEDICATION DOSE DIRECTIONS CONDITION NOTES  Amlodipine 5m25make 1 tab by mouth daily Blood pressure   Atorvastatin 5m63mke 1 tab by mouth daily at 6pm Cholesterol   Claritin 10mg102mablet daily Allergies   Dipyridamole-aspirin 200-25mg 69mblet every 12 hours CAD   Donepezil 10mg 19mlet daily Memory   Trelegy 100-62.5-25mg 1 puff daily COPD   Lorazepam 0.5mg 1 t44met by mouth every morning and  tab everyday at bedtime Anxiety?   Memantine 10mg Tak66mtablet twice daily Memory   Mirtazapine 7.5mg Take 5mablet by mouth daily Appetite/mood    MEDICATION  REVIEW  MEDICATION REVIEW CONDUCTED:   Yes DATE:  12/03/2021 BEST POSSIBLE MEDICATION HISTORY SOURCE:   Medical Records  PHARMACY  VIALS OR PACKS: Packaging  UPSTREAM  SYNC DATE:  12/23/21   ALLERGIES/INTOLERANCES   NAME OF MEDICATION  REACTION    PCN  Rash    CURRENT HEALTHCARE PROVIDER TEAM   PROVIDER/TEAM MEMBER  ROLE  PHONE NUMBER  COMMENTS    [place], [place] Provider [phone],[place] [phone]    Exercises to do While Sitting Silhouette of a male drinking a glass of water with ice.  Exercises that you do while sitting (chair exercises) can give you many of the same benefits as full exercise. Benefits include strengthening  your heart, burning calories, and keeping muscles and joints healthy. Exercise can also improve your mood and help with depression and anxiety. You may benefit from chair exercises if you are unable to do standing exercises due to: Diabetic foot pain. Obesity. Illness. Arthritis. Recovery from surgery or injury. Breathing problems. Balance problems. Another type of disability. Before starting chair exercises, check with your health care provider or a physical therapist to find out how much exercise you can tolerate and which exercises are safe for you. If your health care provider approves: Start out slowly and build up over time. Aim to work up to about 10-20 minutes for each exercise session. Make exercise part of your daily routine. Drink water when you exercise. Do not wait until you are thirsty. Drink every 10-15 minutes. Stop exercising right away if you have pain, nausea, shortness of breath, or dizziness. If you are exercising in a wheelchair, make sure to lock the wheels. Ask your health care provider whether you can do tai chi or yoga. Many positions in these mind-body exercises can be modified to do while seated. Warm-up Before starting other exercises: Sit up as straight as you can. Have  your knees bent at 90 degrees, which is the shape of the capital letter "L." Keep your feet flat on the floor. Sit at the front edge of your chair, if you can. Pull in (tighten) the muscles in your abdomen and stretch your spine and neck as straight as you can. Hold this position for a few minutes. Breathe in and out evenly. Try to concentrate on your breathing, and relax your mind. Stretching Exercise A: Arm stretch Hold your arms out straight in front of your body. Bend your hands at the wrist with your fingers pointing up, as if signaling someone to stop. Notice the slight tension in your forearms as you hold the position. Keeping your arms out and your hands bent, rotate your hands outward as far as you can and hold this stretch. Aim to have your thumbs pointing up and your pinkie fingers pointing down. Slowly repeat arm stretches for one minute as tolerated. Exercise B: Leg stretch If you can move your legs, try to "draw" letters on the floor with the toes of your foot. Write your name with one foot. Write your name with the toes of your other foot. Slowly repeat the movements for one minute as tolerated. Exercise C: Reach for the sky Reach your hands as far over your head as you can to stretch your spine. Move your hands and arms as if you are climbing a rope. Slowly repeat the movements for one minute as tolerated. Range of motion exercises Exercise A: Shoulder roll Let your arms hang loosely at your sides. Lift just your shoulders up toward your ears, then let them relax back down. When your shoulders feel loose, rotate your shoulders in backward and forward circles. Do shoulder rolls slowly for one minute as tolerated. Exercise B: March in place As if you are marching, pump your arms and lift your legs up and down. Lift your knees as high as you can. If you are unable to lift your knees, just pump your arms and move your ankles and feet up and down. March in place for one minute  as tolerated. Exercise C: Seated jumping jacks Let your arms hang down straight. Keeping your arms straight, lift them up over your head. Aim to point your fingers to the ceiling. While you lift your arms, straighten your  legs and slide your heels along the floor to your sides, as wide as you can. As you bring your arms back down to your sides, slide your legs back together. If you are unable to use your legs, just move your arms. Slowly repeat seated jumping jacks for one minute as tolerated. Strengthening exercises Exercise A: Shoulder squeeze Hold your arms straight out from your body to your sides, with your elbows bent and your fists pointed at the ceiling. Keeping your arms in the bent position, move them forward so your elbows and forearms meet in front of your face. Open your arms back out as wide as you can with your elbows still bent, until you feel your shoulder blades squeezing together. Hold for 5 seconds. Slowly repeat the movements forward and backward for one minute as tolerated. Contact a health care provider if: You have to stop exercising due to any of the following: Pain. Nausea. Shortness of breath. Dizziness. Fatigue. You have significant pain or soreness after exercising. Get help right away if: You have chest pain. You have difficulty breathing. These symptoms may represent a serious problem that is an emergency. Do not wait to see if the symptoms will go away. Get medical help right away. Call your local emergency services (911 in the [place] Do not drive yourself to the hospital. Summary Exercises that you do while sitting (chair exercises) can strengthen your heart, burn calories, and keep muscles and joints healthy. You may benefit from chair exercises if you are unable to do standing exercises due to diabetic foot pain, obesity, recovery from surgery or injury, or other conditions. Before starting chair exercises, check with your health care provider or a  physical therapist to find out how much exercise you can tolerate and which exercises are safe for you. This information is not intended to replace advice given to you by your health care provider. Make sure you discuss any questions you have with your health care provider.

## 2021-12-04 ENCOUNTER — Telehealth: Payer: Self-pay | Admitting: Student-PharmD

## 2021-12-04 NOTE — Progress Notes (Signed)
°  Chronic Care Management Pharmacy Assistant   Name: Ian Conley  MRN: [id number] DOB: [birth year]  Reason for Encounter: CCM Care Plan  Medications: Outpatient Encounter Medications as of 12/04/2021  Medication Sig   amLODipine (NORVASC) 5 MG tablet Take 1 tablet (5 mg total) by mouth daily.   atorvastatin (LIPITOR) 80 MG tablet TAKE 1 TABLET(80 MG) BY MOUTH DAILY AT 6 PM   CLARITIN 10 MG CAPS Take by mouth daily.   dipyridamole-aspirin (AGGRENOX) 200-25 MG 12hr capsule Take 1 capsule by mouth 2 (two) times daily.   donepezil (ARICEPT) 10 MG tablet Take 1 tablet (10 mg total) by mouth daily.   Fluticasone-Umeclidin-Vilant (TRELEGY ELLIPTA) 100-62.5-25 MCG/INH AEPB Inhale 1 puff into the lungs daily.   LORazepam (ATIVAN) 0.5 MG tablet TAKE ONE TABLET BY MOUTH EVERY MORNING and TAKE 1/2 TABLET BY MOUTH EVERYDAY AT BEDTIME   memantine (NAMENDA) 10 MG tablet Take 1 tablet (10 mg total) by mouth 2 (two) times daily.   mirtazapine (REMERON) 7.5 MG tablet TAKE 1 TABLET(7.5 MG) BY MOUTH AT BEDTIME   Multiple Vitamins-Minerals (YOUR LIFE MULTI MENS 50+) TABS Take by mouth.   polyethylene glycol powder (GLYCOLAX/MIRALAX) powder as needed.    No facility-administered encounter medications on file as of 12/04/2021.    Reviewed the patients initial visit reinsured it was completed per the pharmacist Alena Bills request. Printed the CCM care plan. Mailed the patient CCM care plan to their most recent address on file.   Follow-Up:Pharmacist Review  Charlann Lange, [place] Pharmacist Assistant [phone]

## 2021-12-08 ENCOUNTER — Other Ambulatory Visit: Payer: Self-pay | Admitting: Physician Assistant

## 2021-12-08 DIAGNOSIS — F01518 Vascular dementia, unspecified severity, with other behavioral disturbance: Secondary | ICD-10-CM

## 2021-12-09 ENCOUNTER — Telehealth: Payer: Self-pay | Admitting: Student-PharmD

## 2021-12-09 NOTE — Progress Notes (Addendum)
°  Chronic Care Management Pharmacy Assistant   Name: ROLAN WRIGHTSMAN  MRN: [id number] DOB: Mar 05, 1951  Reason for Encounter: Medication Review/Mediction Coordination Call.    Medications: Outpatient Encounter Medications as of 12/09/2021  Medication Sig   amLODipine (NORVASC) 5 MG tablet Take 1 tablet (5 mg total) by mouth daily.   atorvastatin (LIPITOR) 80 MG tablet TAKE 1 TABLET(80 MG) BY MOUTH DAILY AT 6 PM   CLARITIN 10 MG CAPS Take by mouth daily.   dipyridamole-aspirin (AGGRENOX) 200-25 MG 12hr capsule Take 1 capsule by mouth 2 (two) times daily.   donepezil (ARICEPT) 10 MG tablet Take 1 tablet (10 mg total) by mouth daily.   Fluticasone-Umeclidin-Vilant (TRELEGY ELLIPTA) 100-62.5-25 MCG/INH AEPB Inhale 1 puff into the lungs daily.   LORazepam (ATIVAN) 0.5 MG tablet TAKE ONE TABLET BY MOUTH EVERY MORNING and TAKE 1/2 TABLET BY MOUTH EVERYDAY AT BEDTIME   memantine (NAMENDA) 10 MG tablet Take 1 tablet (10 mg total) by mouth 2 (two) times daily.   mirtazapine (REMERON) 7.5 MG tablet TAKE 1 TABLET(7.5 MG) BY MOUTH AT BEDTIME   Multiple Vitamins-Minerals (YOUR LIFE MULTI MENS 50+) TABS Take by mouth.   polyethylene glycol powder (GLYCOLAX/MIRALAX) powder as needed.    No facility-administered encounter medications on file as of 12/09/2021.    Reviewed chart for medication changes ahead of medication coordination call.  No OVs, Consults, or hospital visits since last care coordination call.  No medication changes indicated.  BP Readings from Last 3 Encounters:  08/28/21 100/62  06/24/21 108/62  04/21/21 128/65    Lab Results  Component Value Date   HGBA1C 6.2 (H) 07/15/2018     Patient obtains medications through Adherence Packaging  90 Days   Last adherence delivery included:  Amodipine 5 mg 1 tablet for breakfast  Atorvastatin 80 mg 1 tablet at bedtime Aggrenox 200/25 mg 1 tablet at breakfast and 1 tablet at bedtime Donepezil 10 mg 1 tablet at bedtime Memantine 10 mg 1  tablet breakfast and 1 tablet at bedtime  Mirtazapine 7.5 mg 1 tablet bedtime  Lorazepam 0.5 mg 1 tablet at breakfast and 1/2 (Pre-Cut) tablet at bedtime Claritin 10 mg 1 tablet for breakfast Your life Mulitple Men 1 tabet for breakfast  Patient declined meds last month: None.  Patient is due for next adherence delivery on: 12/18/2021.  Called patient and reviewed medications and coordinated delivery.  This delivery to include: Amodipine 5 mg 1 tablet for breakfast  Atorvastatin 80 mg 1 tablet at bedtime Aggrenox 200/25 mg 1 tablet at breakfast and 1 tablet at bedtime Donepezil 10 mg 1 tablet at bedtime Memantine 10 mg 1 tablet breakfast and 1 tablet at bedtime  Mirtazapine 7.5 mg 1 tablet bedtime  Lorazepam 0.5 mg 1 tablet at breakfast and 1/2 (Pre-Cut) tablet at bedtime Claritin 10 mg 1 tablet for breakfast Your life Mulitple Men 1 tabet for breakfast  Patient declined the following medications: None.   Patient needs refills for: Lorazepam 0.5 mg 1 tablet at breakfast and 1/2 (Pre-Cut) tablet at bedtime-Requested.  Confirmed delivery date of 12/18/2021, advised patient that pharmacy will contact them the morning of delivery.  Follow-Up:Pharmacist Review  Charlann Lange, RMA Clinical Pharmacist Assistant [phone]  10 minutes spent in review, coordination, and documentation.  Reviewed by: Alena Bills, PharmD Clinical Pharmacist [phone]

## 2021-12-13 ENCOUNTER — Other Ambulatory Visit: Payer: Self-pay | Admitting: Nurse Practitioner

## 2021-12-13 DIAGNOSIS — F01518 Vascular dementia, unspecified severity, with other behavioral disturbance: Secondary | ICD-10-CM

## 2021-12-13 MED ORDER — LORAZEPAM 0.5 MG PO TABS: ORAL_TABLET | ORAL | 0 refills | Status: DC

## 2021-12-17 ENCOUNTER — Telehealth: Payer: Self-pay | Admitting: Student-PharmD

## 2021-12-17 NOTE — Chronic Care Management (AMB) (Signed)
Chronic Care Management   Pharmacy Note  12/17/2021 Name: Ian Conley MRN: [id number] DOB: October 05, 1951  Referred by: Lavera Guise, MD Reason for referral : RPM Monthly Review    RPM Coordinator: Charlann Lange  ICD Code: I10 Device ID: [id number] [place]: [employer] Measurement Type: blood pressure Days of Monitoring: 17 Documentation Time: 41 Min 0 Sec BLOOD PRESSURE  Measurement Date: 12/16/2021 Tuesday at 76:19 PM Systolic / Diastolic (mmHg): 509 / 59 Measurement Date: 12/16/2021 Tuesday at 32:67 PM Systolic / Diastolic (mmHg): 124 / 69 Measurement Date: 12/15/2021 Monday at 58:09 PM Systolic / Diastolic (mmHg): 983 / 72 Measurement Date: 12/14/2021 Sunday at 38:25 PM Systolic / Diastolic (mmHg): 053 / 68 Measurement Date: 12/12/2021 Friday at 97:67 PM Systolic / Diastolic (mmHg): 341 / 69 Measurement Date: 12/10/2021 Wednesday at 93:79 PM Systolic / Diastolic (mmHg): 024 / 71 Measurement Date: 12/08/2021 Monday at 09:73 PM Systolic / Diastolic (mmHg): 532 / 67 Measurement Date: 12/07/2021 Sunday at 99:24 PM Systolic / Diastolic (mmHg): 268 / 64 Measurement Date: 12/04/2021 Thursday at 34:19 PM Systolic / Diastolic (mmHg): 622 / 67 Measurement Date: 12/03/2021 Wednesday at 29:79 PM Systolic / Diastolic (mmHg): 892 / 74 Measurement Date: 12/01/2021 Monday at 11:94 PM Systolic / Diastolic (mmHg): 174 / 66 Measurement Date: 11/29/2021 Saturday at 08:14 AM Systolic / Diastolic (mmHg): 481 / 75 Measurement Date: 11/28/2021 Friday at 85:63 PM Systolic / Diastolic (mmHg): 149 / 66 Measurement Date: 11/28/2021 Friday at 70:26 PM Systolic / Diastolic (mmHg): 378 / 71 Measurement Date: 11/26/2021 Wednesday at 58:85 PM Systolic / Diastolic (mmHg): 027 / 61 Measurement Date: 11/24/2021 Monday at 74:12 PM Systolic / Diastolic (mmHg): 878 / 67 Measurement Date: 11/23/2021 Sunday at 67:67 PM Systolic / Diastolic (mmHg): 209 / 69 Measurement Date: 11/22/2021 Saturday at 47:09  PM Systolic / Diastolic (mmHg): 628 / 66 Measurement Date: 11/21/2021 Friday at 36:62 AM Systolic / Diastolic (mmHg): 947 / 75  PULSE  Measurement Date: 12/16/2021 Tuesday at 03:46 PM Pulse (IN BPM): 75 Measurement Date: 12/16/2021 Tuesday at 03:42 PM Pulse (IN BPM): 78 Measurement Date: 12/15/2021 Monday at 07:48 PM Pulse (IN BPM): 70 Measurement Date: 12/14/2021 Sunday at 04:00 PM Pulse (IN BPM): 75 Measurement Date: 12/12/2021 Friday at 09:05 PM Pulse (IN BPM): 71 Measurement Date: 12/10/2021 Wednesday at 04:46 PM Pulse (IN BPM): 73 Measurement Date: 12/08/2021 Monday at 08:13 PM Pulse (IN BPM): 70 Measurement Date: 12/07/2021 Sunday at 06:51 PM Pulse (IN BPM): 72 Measurement Date: 12/04/2021 Thursday at 08:25 PM Pulse (IN BPM): 64 Measurement Date: 12/03/2021 Wednesday at 12:39 PM Pulse (IN BPM): 69 Measurement Date: 12/01/2021 Monday at 08:29 PM Pulse (IN BPM): 65 Measurement Date: 11/29/2021 Saturday at 10:54 AM Pulse (IN BPM): 65 Measurement Date: 11/28/2021 Friday at 04:46 PM Pulse (IN BPM): 76 Measurement Date: 11/28/2021 Friday at 04:39 PM Pulse (IN BPM): 81 Measurement Date: 11/26/2021 Wednesday at 06:08 PM Pulse (IN BPM): 69 Measurement Date: 11/24/2021 Monday at 03:46 PM Pulse (IN BPM): 74 Measurement Date: 11/23/2021 Sunday at 05:22 PM Pulse (IN BPM): 70 Measurement Date: 11/22/2021 Saturday at 07:40 PM Pulse (IN BPM): 62 Measurement Date: 11/21/2021 Friday at 11:18 AM Pulse (IN BPM): 68        Notes for December-2022 : 41 Min 0 Sec  Date and Time: 12/17/2021 Wednesday at 10:44 AM User: Alena Bills Time logged: 05:00 Notes: DATA REVIEW SYSTOLIC BP  Automatically transmitted data is reviewed today from device. (ID: [id number]).  Systolic BP reading is >656 for 0 % of  time  Systolic BP reading is between 160 to 179 for 0 % of time  Systolic BP reading is between 140 to 159 for 10.53 % of time  Systolic BP reading is between 120-139 for 78.95 % of  time  Systolic BP reading is between 101-120 for 26.32 % of time  Systolic BP reading is <694 for 0 % of time  DIASTOLIC BP  Diastolic BP reading is elevated >100 for 0 % of time  Diastolic BP reading is between 90-99 for 0 % of time  PULSE  Pulse is >120 for 0 % of time  Pulse is <50 for 0 % of time  COMPLIANCE NOTES BLOOD PRESSURE  Patient is compliant - 16 days of readings obtained.  MANAGEMENT NOTES We will continue to monitor BP readings for now  No change on the treatment plan  Medications for BP reviewed for this patient   Date and Time: 12/17/2021 Wednesday at 09:00 AM User: Charlann Lange Time logged: 04:00 Notes: REVIEW NOTES All the daily Blood pressure data noted since last review  Heart rate/Pulse data reviewed   Date and Time: 12/16/2021 Tuesday at 01:57 PM User: Charlann Lange Time logged: 08:00 Notes: REVIEW NOTES All the daily Blood pressure data noted since last review  Heart rate/Pulse data reviewed   Date and Time: 12/09/2021 Tuesday at 02:58 PM User: Charlann Lange Time logged: 02:00 Notes: REVIEW NOTES All the daily Blood pressure data noted since last review  Heart rate/Pulse data reviewed   Date and Time: 12/08/2021 Monday at 04:07 PM User: Charlann Lange Time logged: 02:00 Notes: REVIEW NOTES All the daily Blood pressure data noted since last review  Heart rate/Pulse data reviewed   Date and Time: 12/05/2021 Friday at 04:19 PM User: Charlann Lange Time logged: 02:00 Notes: REVIEW NOTES All the daily Blood pressure data noted since last review  Heart rate/Pulse data reviewed   Date and Time: 12/03/2021 Wednesday at 02:01 PM User: Charlann Lange Time logged: 02:00 Notes: REVIEW NOTES All the daily Blood pressure data noted since last review  Heart rate/Pulse data reviewed   Date and Time: 12/01/2021 Monday at 04:11 PM User: Charlann Lange Time logged: 06:00 Notes: REVIEW  NOTES All the daily Blood pressure data noted since last review  Heart rate/Pulse data reviewed   Date and Time: 11/27/2021 Thursday at 04:23 PM User: Charlann Lange Time logged: 02:00 Notes: REVIEW NOTES All the daily Blood pressure data noted since last review  Heart rate/Pulse data reviewed   Date and Time: 11/24/2021 Monday at 04:19 PM User: Charlann Lange Time logged: 06:00 Notes: REVIEW NOTES All the daily Blood pressure data noted since last review  Heart rate/Pulse data reviewed   Date and Time: 11/21/2021 Friday at 02:15 PM User: Charlann Lange Time logged: 02:00 Notes: REVIEW NOTES All the daily Blood pressure data noted since last review  Heart rate/Pulse data reviewed  BP numbers are stable except some elevations  Verbal Consent Obtained on March 27, 2021. Patient was notified about Remote Patient Monitoring (RPM) program offered by our medical practice to improve the patient care, co-ordination of care, patient education, provide quality of care, reduce the cost and provide personalized care. Patient was informed that this is Medicare approved program and can be furnished by only one provider, can be stopped by the patient anytime. Patient has verbally consented to enroll in this program and our designated care coordinator to contact by phone as needed at least for 20 minutes a month. Practice also provides  24/7 care to the patient needs. All patient questions were answered and patient was enrolled in the RPM program.  5 minutes spent in review, coordination, and documentation.  Reviewed by: Alena Bills, PharmD Clinical Pharmacist [phone]

## 2021-12-19 DIAGNOSIS — J439 Emphysema, unspecified: Secondary | ICD-10-CM | POA: Diagnosis not present

## 2021-12-19 DIAGNOSIS — I10 Essential (primary) hypertension: Secondary | ICD-10-CM | POA: Diagnosis not present

## 2021-12-19 DIAGNOSIS — M1611 Unilateral primary osteoarthritis, right hip: Secondary | ICD-10-CM | POA: Diagnosis not present

## 2022-01-18 DIAGNOSIS — J439 Emphysema, unspecified: Secondary | ICD-10-CM | POA: Diagnosis not present

## 2022-01-18 DIAGNOSIS — I10 Essential (primary) hypertension: Secondary | ICD-10-CM | POA: Diagnosis not present

## 2022-01-18 DIAGNOSIS — M1611 Unilateral primary osteoarthritis, right hip: Secondary | ICD-10-CM | POA: Diagnosis not present

## 2022-01-19 ENCOUNTER — Telehealth: Payer: Self-pay

## 2022-01-19 NOTE — Telephone Encounter (Signed)
Left vm to confirm 01/21/22 appointment-Toni

## 2022-01-20 ENCOUNTER — Telehealth: Payer: Self-pay | Admitting: Student-PharmD

## 2022-01-20 NOTE — Chronic Care Management (AMB) (Signed)
Chronic Care Management   Pharmacy Note  01/20/2022 Name: Ian Conley MRN: [id number] DOB: 09-Jun-1951  Referred by: Lavera Guise, MD Reason for referral : RPM Monthly Review      [place]  Name: Ian Conley  Date of Birth: 1951-09-21  Gender: Male  Primary provider: [facility]ign[facility]  RPM Coordinator: Charlann Lange  ICD Code: I10 Device ID: [id number] [place]: [employer] Measurement Type: blood pressure Days of Monitoring: 16 Documentation Time: 40 Min 0 Sec BLOOD PRESSURE  Measurement Date: 01/19/2022 Monday at 41:32 PM Systolic / Diastolic (mmHg): 440 / 63 Measurement Date: 01/18/2022 Sunday at 10:27 PM Systolic / Diastolic (mmHg): 253 / 61 Measurement Date: 01/16/2022 Friday at 66:44 PM Systolic / Diastolic (mmHg): 034 / 73 Measurement Date: 01/15/2022 Thursday at 74:25 PM Systolic / Diastolic (mmHg): 956 / 62 Measurement Date: 01/13/2022 Tuesday at 38:75 AM Systolic / Diastolic (mmHg): 643 / 80 Measurement Date: 01/13/2022 Tuesday at 32:95 AM Systolic / Diastolic (mmHg): 188 / 78 Measurement Date: 01/11/2022 Sunday at 41:66 PM Systolic / Diastolic (mmHg): 063 / 56 Measurement Date: 01/09/2022 Friday at 01:60 PM Systolic / Diastolic (mmHg): 109 / 66 Measurement Date: 01/06/2022 Tuesday at 32:35 PM Systolic / Diastolic (mmHg): 573 / 66 Measurement Date: 01/06/2022 Tuesday at 22:02 PM Systolic / Diastolic (mmHg): 542 / 65 Measurement Date: 01/04/2022 Sunday at 70:62 PM Systolic / Diastolic (mmHg): 376 / 65 Measurement Date: 01/03/2022 Saturday at 28:31 PM Systolic / Diastolic (mmHg): 517 / 74 Measurement Date: 12/30/2021 Tuesday at 61:60 PM Systolic / Diastolic (mmHg): 737 / 70 Measurement Date: 12/29/2021 Monday at 10:62 PM Systolic / Diastolic (mmHg): 694 / 64 Measurement Date: 12/27/2021 Saturday at 85:46 PM Systolic / Diastolic (mmHg): 270 / 63 Measurement Date: 12/24/2021 Wednesday at 35:00 PM Systolic / Diastolic (mmHg): 938 / 64 Measurement Date:  12/23/2021 Tuesday at 18:29 PM Systolic / Diastolic (mmHg): 937 / 73 Measurement Date: 12/22/2021 Monday at 16:96 PM Systolic / Diastolic (mmHg): 789 / 66  PULSE  Measurement Date: 01/19/2022 Monday at 03:42 PM Pulse (IN BPM): 75 Measurement Date: 01/18/2022 Sunday at 06:21 PM Pulse (IN BPM): 68 Measurement Date: 01/16/2022 Friday at 01:13 PM Pulse (IN BPM): 71 Measurement Date: 01/15/2022 Thursday at 08:19 PM Pulse (IN BPM): 64 Measurement Date: 01/13/2022 Tuesday at 11:13 AM Pulse (IN BPM): 70 Measurement Date: 01/13/2022 Tuesday at 11:08 AM Pulse (IN BPM): 68 Measurement Date: 01/11/2022 Sunday at 08:21 PM Pulse (IN BPM): 66 Measurement Date: 01/09/2022 Friday at 02:21 PM Pulse (IN BPM): 75 Measurement Date: 01/06/2022 Tuesday at 01:02 PM Pulse (IN BPM): 67 Measurement Date: 01/06/2022 Tuesday at 12:59 PM Pulse (IN BPM): 67 Measurement Date: 01/04/2022 Sunday at 04:58 PM Pulse (IN BPM): 68 Measurement Date: 01/03/2022 Saturday at 01:33 PM Pulse (IN BPM): 72 Measurement Date: 12/30/2021 Tuesday at 12:58 PM Pulse (IN BPM): 75 Measurement Date: 12/29/2021 Monday at 02:11 PM Pulse (IN BPM): 75 Measurement Date: 12/27/2021 Saturday at 07:06 PM Pulse (IN BPM): 71 Measurement Date: 12/24/2021 Wednesday at 04:42 PM Pulse (IN BPM): 76 Measurement Date: 12/23/2021 Tuesday at 02:16 PM Pulse (IN BPM): 71 Measurement Date: 12/22/2021 Monday at 03:04 PM Pulse (IN BPM): 70        Notes for January-2023 : 40 Min 0 Sec  Date and Time: 01/20/2022 Tuesday at 10:28 AM User: Alena Bills Time logged: 10:00 Notes: DATA REVIEW SYSTOLIC BP  Automatically transmitted data is reviewed today from device. (ID: [id number]).  Systolic BP reading is >258 for 0 % of time  Systolic BP  reading is between 160 to 179 for 0 % of time  Systolic BP reading is between 140 to 159 for 11.11 % of time  Systolic BP reading is between 120-139 for 50.0 % of time  Systolic BP reading is between 101-120  for 44.44 % of time  Systolic BP reading is <951 for 0 % of time  DIASTOLIC BP  Diastolic BP reading is elevated >100 for 0 % of time  Diastolic BP reading is between 90-99 for 0 % of time  PULSE  Pulse is >120 for 0 % of time  Pulse is <50 for 0 % of time  COMPLIANCE NOTES BLOOD PRESSURE  Patient is compliant - 16 days of readings obtained.  MANAGEMENT NOTES We will continue to monitor BP readings for now  Medications for BP reviewed for this patient  No change on the treatment plan   Date and Time: 01/20/2022 Tuesday at 08:48 AM User: Charlann Lange Time logged: 08:00 Notes: REVIEW NOTES All the daily Blood pressure data noted since last review  Heart rate/Pulse data reviewed   Date and Time: 01/15/2022 Thursday at 02:56 PM User: Charlann Lange Time logged: 02:00 Notes: REVIEW NOTES All the daily Blood pressure data noted since last review  Heart rate/Pulse data reviewed   Date and Time: 01/12/2022 Monday at 12:45 PM User: Charlann Lange Time logged: 02:00 Notes: REVIEW NOTES All the daily Blood pressure data noted since last review  Heart rate/Pulse data reviewed   Date and Time: 01/09/2022 Friday at 03:49 PM User: Charlann Lange Time logged: 02:00 Notes: REVIEW NOTES All the daily Blood pressure data noted since last review  Heart rate/Pulse data reviewed   Date and Time: 01/07/2022 Wednesday at 03:38 PM User: Charlann Lange Time logged: 04:00 Notes: REVIEW NOTES All the daily Blood pressure data noted since last review  Heart rate/Pulse data reviewed   Date and Time: 01/05/2022 Monday at 03:42 PM User: Charlann Lange Time logged: 04:00 Notes: REVIEW NOTES All the daily Blood pressure data noted since last review  Heart rate/Pulse data reviewed   Date and Time: 01/01/2022 Thursday at 03:26 PM User: Charlann Lange Time logged: 02:00 Notes: REVIEW NOTES All the daily Blood pressure data noted since  last review  Heart rate/Pulse data reviewed   Date and Time: 12/29/2021 Monday at 02:49 PM User: Charlann Lange Time logged: 02:00 Notes: REVIEW NOTES All the daily Blood pressure data noted since last review  Heart rate/Pulse data reviewed   Date and Time: 12/23/2021 Tuesday at 03:35 PM User: Charlann Lange Time logged: 04:00 Notes: REVIEW NOTES All the daily Blood pressure data noted since last review  Heart rate/Pulse data reviewed  Verbal Consent Obtained on March 27, 2021. Patient was notified about Remote Patient Monitoring (RPM) program offered by our medical practice to improve the patient care, co-ordination of care, patient education, provide quality of care, reduce the cost and provide personalized care. Patient was informed that this is Medicare approved program and can be furnished by only one provider, can be stopped by the patient anytime. Patient has verbally consented to enroll in this program and our designated care coordinator to contact by phone as needed at least for 20 minutes a month. Practice also provides 24/7 care to the patient needs. All patient questions were answered and patient was enrolled in the RPM program.  10 minutes spent in review, coordination, and documentation.  Reviewed by: Alena Bills, PharmD Clinical Pharmacist [phone]

## 2022-01-21 ENCOUNTER — Encounter: Payer: Self-pay | Admitting: Nurse Practitioner

## 2022-01-21 ENCOUNTER — Other Ambulatory Visit: Payer: Self-pay

## 2022-01-21 ENCOUNTER — Ambulatory Visit (INDEPENDENT_AMBULATORY_CARE_PROVIDER_SITE_OTHER): Payer: Medicare Other | Admitting: Nurse Practitioner

## 2022-01-21 ENCOUNTER — Encounter (INDEPENDENT_AMBULATORY_CARE_PROVIDER_SITE_OTHER): Payer: Self-pay

## 2022-01-21 VITALS — BP 140/62 | HR 82 | Temp 99.0°F | Resp 16 | Ht 71.0 in | Wt 154.8 lb

## 2022-01-21 DIAGNOSIS — E538 Deficiency of other specified B group vitamins: Secondary | ICD-10-CM

## 2022-01-21 DIAGNOSIS — F01518 Vascular dementia, unspecified severity, with other behavioral disturbance: Secondary | ICD-10-CM | POA: Diagnosis not present

## 2022-01-21 DIAGNOSIS — Z0001 Encounter for general adult medical examination with abnormal findings: Secondary | ICD-10-CM | POA: Diagnosis not present

## 2022-01-21 DIAGNOSIS — E559 Vitamin D deficiency, unspecified: Secondary | ICD-10-CM

## 2022-01-21 DIAGNOSIS — D751 Secondary polycythemia: Secondary | ICD-10-CM

## 2022-01-21 DIAGNOSIS — F321 Major depressive disorder, single episode, moderate: Secondary | ICD-10-CM

## 2022-01-21 DIAGNOSIS — R7989 Other specified abnormal findings of blood chemistry: Secondary | ICD-10-CM

## 2022-01-21 DIAGNOSIS — I10 Essential (primary) hypertension: Secondary | ICD-10-CM | POA: Diagnosis not present

## 2022-01-21 DIAGNOSIS — Z8673 Personal history of transient ischemic attack (TIA), and cerebral infarction without residual deficits: Secondary | ICD-10-CM | POA: Diagnosis not present

## 2022-01-21 DIAGNOSIS — J449 Chronic obstructive pulmonary disease, unspecified: Secondary | ICD-10-CM | POA: Diagnosis not present

## 2022-01-21 DIAGNOSIS — R300 Dysuria: Secondary | ICD-10-CM

## 2022-01-21 DIAGNOSIS — Z23 Encounter for immunization: Secondary | ICD-10-CM

## 2022-01-21 DIAGNOSIS — I700 Atherosclerosis of aorta: Secondary | ICD-10-CM | POA: Diagnosis not present

## 2022-01-21 DIAGNOSIS — E782 Mixed hyperlipidemia: Secondary | ICD-10-CM

## 2022-01-21 MED ORDER — PNEUMOCOCCAL 20-VAL CONJ VACC 0.5 ML IM SUSY: 0.5000 mL | PREFILLED_SYRINGE | INTRAMUSCULAR | 0 refills | Status: AC

## 2022-01-21 MED ORDER — DONEPEZIL HCL 10 MG PO TABS: 10.0000 mg | ORAL_TABLET | Freq: Every day | ORAL | 1 refills | Status: DC

## 2022-01-21 MED ORDER — ASPIRIN-DIPYRIDAMOLE ER 25-200 MG PO CP12: 1.0000 | ORAL_CAPSULE | Freq: Two times a day (BID) | ORAL | 1 refills | Status: DC

## 2022-01-21 MED ORDER — ATORVASTATIN CALCIUM 80 MG PO TABS: ORAL_TABLET | ORAL | 1 refills | Status: DC

## 2022-01-21 MED ORDER — LORAZEPAM 0.5 MG PO TABS: ORAL_TABLET | ORAL | 0 refills | Status: DC

## 2022-01-21 MED ORDER — AMLODIPINE BESYLATE 5 MG PO TABS: 5.0000 mg | ORAL_TABLET | Freq: Every day | ORAL | 3 refills | Status: DC

## 2022-01-21 MED ORDER — MEMANTINE HCL 10 MG PO TABS: 10.0000 mg | ORAL_TABLET | Freq: Two times a day (BID) | ORAL | 1 refills | Status: DC

## 2022-01-21 MED ORDER — MIRTAZAPINE 7.5 MG PO TABS: ORAL_TABLET | ORAL | 1 refills | Status: DC

## 2022-01-21 NOTE — Progress Notes (Signed)
[facility] [place], [place] [address]  Internal MEDICINE  Office Visit Note  Patient Name: Ian Conley  [id number]  [id number]  Date of Service: 01/21/2022  Chief Complaint  Patient presents with   Medicare Wellness    Pt wants blood work     HPI Ian Conley presents for an annual well visit and physical exam.  He is a well-appearing 71 year old male with hypertension, COPD, and history of stroke.  He is due for medication refills and routine labs.  He had a colonoscopy in 2021 and is due for the pneumonia vaccination.  He is here with his sister. She is not with him everyday, and he lives in his home with his son who is autistic. -He has gained 14 pounds since he was last seen in May 2022 by Drema Dallas, PA-C.  He has been using the protein shakes he has at home. -son and sister help with medications.  [facility] and his medications are prepackaged for each time that he needs to take them so it has been easier to ensure that he stays on top of his medications. -She reports he takes lorazepam 0.76m tab 1 in Am and 1/2 tab at night to help with his behavioral changes related to dementia and this seems to work well. -He does have home health staff that comes to visit at times in the past.      Current Medication: Outpatient Encounter Medications as of 01/21/2022  Medication Sig   CLARITIN 10 MG CAPS Take by mouth daily.   Fluticasone-Umeclidin-Vilant (TRELEGY ELLIPTA) 100-62.5-25 MCG/INH AEPB Inhale 1 puff into the lungs daily.   Multiple Vitamins-Minerals (YOUR LIFE MULTI MENS 50+) TABS Take by mouth.   polyethylene glycol powder (GLYCOLAX/MIRALAX) powder as needed.    [DISCONTINUED] amLODipine (NORVASC) 5 MG tablet Take 1 tablet (5 mg total) by mouth daily.   [DISCONTINUED] atorvastatin (LIPITOR) 80 MG tablet TAKE 1 TABLET(80 MG) BY MOUTH DAILY AT 6 PM   [DISCONTINUED] dipyridamole-aspirin (AGGRENOX) 200-25 MG 12hr capsule  Take 1 capsule by mouth 2 (two) times daily.   [DISCONTINUED] donepezil (ARICEPT) 10 MG tablet Take 1 tablet (10 mg total) by mouth daily.   [DISCONTINUED] LORazepam (ATIVAN) 0.5 MG tablet TAKE ONE TABLET BY MOUTH EVERY MORNING and TAKE 1/2 TABLET BY MOUTH EVERYDAY AT BEDTIME   [DISCONTINUED] memantine (NAMENDA) 10 MG tablet Take 1 tablet (10 mg total) by mouth 2 (two) times daily.   [DISCONTINUED] mirtazapine (REMERON) 7.5 MG tablet TAKE 1 TABLET(7.5 MG) BY MOUTH AT BEDTIME   [DISCONTINUED] pneumococcal 20-valent conjugate vaccine (PREVNAR 20) 0.5 ML injection Inject 0.5 mLs into the muscle tomorrow at 10 am.   amLODipine (NORVASC) 5 MG tablet Take 1 tablet (5 mg total) by mouth daily.   atorvastatin (LIPITOR) 80 MG tablet TAKE 1 TABLET(80 MG) BY MOUTH DAILY AT 6 PM   dipyridamole-aspirin (AGGRENOX) 200-25 MG 12hr capsule Take 1 capsule by mouth 2 (two) times daily.   donepezil (ARICEPT) 10 MG tablet Take 1 tablet (10 mg total) by mouth daily.   LORazepam (ATIVAN) 0.5 MG tablet TAKE ONE TABLET BY MOUTH EVERY MORNING and TAKE 1/2 TABLET BY MOUTH EVERYDAY AT BEDTIME   memantine (NAMENDA) 10 MG tablet Take 1 tablet (10 mg total) by mouth 2 (two) times daily.   mirtazapine (REMERON) 7.5 MG tablet TAKE 1 TABLET(7.5 MG) BY MOUTH AT BEDTIME   [EXPIRED] pneumococcal 20-valent conjugate vaccine (PREVNAR 20) 0.5 ML injection Inject 0.5 mLs  into the muscle tomorrow at 10 am for 1 dose.   No facility-administered encounter medications on file as of 01/21/2022.    Surgical History: History reviewed. No pertinent surgical history.  Medical History: Past Medical History:  Diagnosis Date   Allergy    Anemia    Hypertension    Pancreatitis    Personal history of tobacco use, presenting hazards to health 03/25/2016   Polycythemia    Stroke [facility])    2008 9/11    Family History: Family History  Problem Relation Age of Onset   Diabetes Mother    Heart failure Mother    Cancer Father        Throat     Stroke Father    Hypertension Father    HIV Brother    Thyroid disease Sister        thyroid cancer   Hypertension Sister        both sisters    Social History   Socioeconomic History   Marital status: Widowed    Spouse name: Not on file   Number of children: Not on file   Years of education: Not on file   Highest education level: Not on file  Occupational History   Not on file  Tobacco Use   Smoking status: Every Day    Packs/day: 1.00    Years: 45.00    Pack years: 45.00    Types: Cigarettes   Smokeless tobacco: Never  Vaping Use   Vaping Use: Never used  Substance and Sexual Activity   Alcohol use: Not Currently    Comment: when pt was at home, no alcohol since July, 2019   Drug use: No   Sexual activity: Not on file  Other Topics Concern   Not on file  Social History Narrative   Not on file   Social Determinants of Health   Financial Resource Strain: Not on file  Food Insecurity: Not on file  Transportation Needs: Not on file  Physical Activity: Not on file  Stress: Not on file  Social Connections: Not on file  Intimate Partner Violence: Not on file      Review of Systems  Constitutional:  Negative for activity change, appetite change, chills, fatigue, fever and unexpected weight change.  HENT: Negative.  Negative for congestion, ear pain, rhinorrhea, sore throat and trouble swallowing.   Eyes: Negative.   Respiratory: Negative.  Negative for cough, chest tightness, shortness of breath and wheezing.   Cardiovascular: Negative.  Negative for chest pain and palpitations.  Gastrointestinal: Negative.  Negative for abdominal pain, blood in stool, constipation, diarrhea, nausea and vomiting.  Endocrine: Negative.   Genitourinary: Negative.  Negative for difficulty urinating, dysuria, frequency, hematuria and urgency.  Musculoskeletal: Negative.  Negative for arthralgias, back pain, joint swelling, myalgias and neck pain.  Skin: Negative.  Negative for rash  and wound.  Allergic/Immunologic: Negative.  Negative for immunocompromised state.  Neurological: Negative.  Negative for dizziness, seizures, numbness and headaches.  Hematological: Negative.   Psychiatric/Behavioral:  Positive for agitation and confusion (intermittent, patient has dementia). Negative for behavioral problems, self-injury and suicidal ideas. The patient is not nervous/anxious.    Vital Signs: BP 140/62    Pulse 82    Temp 99 F (37.2 C)    Resp 16    Ht 5' 11"  (1.803 m)    Wt 154 lb 12.8 oz (70.2 kg)    SpO2 98%    BMI 21.59 kg/m    Physical Exam Vitals  reviewed.  Constitutional:      General: He is not in acute distress.    Appearance: Normal appearance. He is well-developed and normal weight. He is not ill-appearing or diaphoretic.  HENT:     Head: Normocephalic and atraumatic.     Right Ear: Tympanic membrane, ear canal and external ear normal.     Left Ear: Tympanic membrane, ear canal and external ear normal.     Nose: Nose normal. No congestion or rhinorrhea.     Mouth/Throat:     Mouth: Mucous membranes are moist.     Pharynx: Oropharynx is clear. No oropharyngeal exudate or posterior oropharyngeal erythema.  Eyes:     General: No scleral icterus.       Right eye: No discharge.        Left eye: No discharge.     Extraocular Movements: Extraocular movements intact.     Conjunctiva/sclera: Conjunctivae normal.     Pupils: Pupils are equal, round, and reactive to light.  Neck:     Thyroid: No thyromegaly.     Vascular: No JVD.     Trachea: No tracheal deviation.  Cardiovascular:     Rate and Rhythm: Normal rate and regular rhythm.     Pulses: Normal pulses.     Heart sounds: Normal heart sounds. No murmur heard.   No friction rub. No gallop.  Pulmonary:     Effort: Pulmonary effort is normal. No respiratory distress.     Breath sounds: Normal breath sounds. No stridor. No wheezing or rales.  Chest:     Chest wall: No tenderness.  Abdominal:      General: Bowel sounds are normal. There is no distension.     Palpations: Abdomen is soft. There is no mass.     Tenderness: There is no abdominal tenderness. There is no guarding or rebound.  Musculoskeletal:        General: No tenderness or deformity. Normal range of motion.     Cervical back: Normal range of motion and neck supple.  Lymphadenopathy:     Cervical: No cervical adenopathy.  Skin:    General: Skin is warm and dry.     Capillary Refill: Capillary refill takes less than 2 seconds.     Coloration: Skin is not pale.     Findings: No erythema or rash.  Neurological:     Mental Status: He is alert. Mental status is at baseline.     Cranial Nerves: No cranial nerve deficit.     Motor: No abnormal muscle tone.     Coordination: Coordination normal.     Deep Tendon Reflexes: Reflexes are normal and symmetric.  Psychiatric:        Mood and Affect: Mood normal.        Behavior: Behavior normal.        Thought Content: Thought content normal.        Judgment: Judgment normal.       Assessment/Plan: 1. Encounter for general adult medical examination with abnormal findings Age-appropriate preventive screenings and vaccinations discussed, annual physical exam completed. Routine labs for health maintenance ordered, see below. PHM updated.  - CMP14+EGFR  2. Essential hypertension Routine labs ordered, continue amlodipine as prescribed, refills ordered. - amLODipine (NORVASC) 5 MG tablet; Take 1 tablet (5 mg total) by mouth daily.  Dispense: 90 tablet; Refill: 3 - CMP14+EGFR  3. Chronic obstructive pulmonary disease, unspecified COPD type ([place]) Routine lab ordered.  Continue Trelegy as ordered. - CMP14+EGFR  4. History  of stroke Patient has a history of a stroke and takes Aggrenox for anticoagulation for prevention of subsequent stroke. - dipyridamole-aspirin (AGGRENOX) 200-25 MG 12hr capsule; Take 1 capsule by mouth 2 (two) times daily.  Dispense: 180 capsule; Refill:  1  5. Atherosclerosis of aorta (HCC) Routine labs ordered, patient is on high intensity statin therapy for prevention of subsequent stroke - atorvastatin (LIPITOR) 80 MG tablet; TAKE 1 TABLET(80 MG) BY MOUTH DAILY AT 6 PM  Dispense: 90 tablet; Refill: 1 - CMP14+EGFR - Lipid Profile  6. Vitamin D deficiency Routine lab ordered - Vitamin D (25 hydroxy)  7. B12 deficiency Routine lab ordered - B12 and Folate Panel  8. Low TSH level Routine lab ordered - CMP14+EGFR - TSH + free T4  9. Polycythemia Routine lab ordered - CBC with Differential/Platelet  10. Dysuria Routine urinalysis done - UA/M w/rflx Culture, Routine - Microscopic Examination  11. Need for vaccination - pneumococcal 20-valent conjugate vaccine (PREVNAR 20) 0.5 ML injection; Inject 0.5 mLs into the muscle tomorrow at 10 am for 1 dose.  Dispense: 0.5 mL; Refill: 0  12. Dementia, multiinfarct, with behavioral disturbance Patient is stable, current medications and doses are effective, medication refills ordered, routine lab ordered. - memantine (NAMENDA) 10 MG tablet; Take 1 tablet (10 mg total) by mouth 2 (two) times daily.  Dispense: 180 tablet; Refill: 1 - donepezil (ARICEPT) 10 MG tablet; Take 1 tablet (10 mg total) by mouth daily.  Dispense: 90 tablet; Refill: 1 - LORazepam (ATIVAN) 0.5 MG tablet; TAKE ONE TABLET BY MOUTH EVERY MORNING and TAKE 1/2 TABLET BY MOUTH EVERYDAY AT BEDTIME  Dispense: 135 tablet; Refill: 0 - CMP14+EGFR  13. Episode of moderate major depression (HCC) Stable, refills ordered - mirtazapine (REMERON) 7.5 MG tablet; TAKE 1 TABLET(7.5 MG) BY MOUTH AT BEDTIME  Dispense: 90 tablet; Refill: 1      General Counseling: Theophilus verbalizes understanding of the findings of todays visit and agrees with plan of treatment. I have discussed any further diagnostic evaluation that may be needed or ordered today. We also reviewed his medications today. he has been encouraged to call the office with  any questions or concerns that should arise related to todays visit.    Orders Placed This Encounter  Procedures   Microscopic Examination   UA/M w/rflx Culture, Routine   CBC with Differential/Platelet   CMP14+EGFR   Lipid Profile   TSH + free T4   Vitamin D (25 hydroxy)   B12 and Folate Panel    Meds ordered this encounter  Medications   pneumococcal 20-valent conjugate vaccine (PREVNAR 20) 0.5 ML injection    Sig: Inject 0.5 mLs into the muscle tomorrow at 10 am for 1 dose.    Dispense:  0.5 mL    Refill:  0   memantine (NAMENDA) 10 MG tablet    Sig: Take 1 tablet (10 mg total) by mouth 2 (two) times daily.    Dispense:  180 tablet    Refill:  1   donepezil (ARICEPT) 10 MG tablet    Sig: Take 1 tablet (10 mg total) by mouth daily.    Dispense:  90 tablet    Refill:  1   amLODipine (NORVASC) 5 MG tablet    Sig: Take 1 tablet (5 mg total) by mouth daily.    Dispense:  90 tablet    Refill:  3   mirtazapine (REMERON) 7.5 MG tablet    Sig: TAKE 1 TABLET(7.5 MG) BY MOUTH AT BEDTIME  Dispense:  90 tablet    Refill:  1   LORazepam (ATIVAN) 0.5 MG tablet    Sig: TAKE ONE TABLET BY MOUTH EVERY MORNING and TAKE 1/2 TABLET BY MOUTH EVERYDAY AT BEDTIME    Dispense:  135 tablet    Refill:  0   atorvastatin (LIPITOR) 80 MG tablet    Sig: TAKE 1 TABLET(80 MG) BY MOUTH DAILY AT 6 PM    Dispense:  90 tablet    Refill:  1   dipyridamole-aspirin (AGGRENOX) 200-25 MG 12hr capsule    Sig: Take 1 capsule by mouth 2 (two) times daily.    Dispense:  180 capsule    Refill:  1    Return in about 3 months (around 04/20/2022) for F/U routine Krisi Azua or lauren.   Total time spent:30 Minutes Time spent includes review of chart, medications, test results, and follow up plan with the patient.    Controlled Substance Database was reviewed by me.  This patient was seen by Jonetta Osgood, FNP-C in collaboration with Dr. Clayborn Bigness as a part of collaborative care agreement.  Haeleigh Streiff R.  Valetta Fuller, MSN, FNP-C Internal medicine

## 2022-01-22 LAB — B12 AND FOLATE PANEL
Folate: >20 ng/mL
Vitamin B-12: 848 pg/mL (ref 232–1245)

## 2022-01-22 LAB — UA/M W/RFLX CULTURE, ROUTINE
Bilirubin, UA: NEGATIVE
Glucose, UA: NEGATIVE
Ketones, UA: NEGATIVE
Leukocytes,UA: NEGATIVE
Nitrite, UA: NEGATIVE
Protein,UA: NEGATIVE
RBC, UA: NEGATIVE
Specific Gravity, UA: 1.02 (ref 1.005–1.030)
Urobilinogen, Ur: 0.2 mg/dL (ref 0.2–1.0)
pH, UA: 5.5 (ref 5.0–7.5)

## 2022-01-22 LAB — CMP14+EGFR
ALT: 22 IU/L (ref 0–44)
AST: 20 IU/L (ref 0–40)
Albumin/Globulin Ratio: 1.7 (ref 1.2–2.2)
Albumin: 4.4 g/dL (ref 3.8–4.8)
Alkaline Phosphatase: 134 IU/L — ABNORMAL HIGH (ref 44–121)
BUN/Creatinine Ratio: 7 — ABNORMAL LOW (ref 10–24)
BUN: 8 mg/dL (ref 8–27)
Bilirubin Total: 0.2 mg/dL (ref 0.0–1.2)
CO2: 23 mmol/L (ref 20–29)
Calcium: 9.7 mg/dL (ref 8.6–10.2)
Chloride: 106 mmol/L (ref 96–106)
Creatinine, Ser: 1.18 mg/dL (ref 0.76–1.27)
Globulin, Total: 2.6 g/dL (ref 1.5–4.5)
Glucose: 83 mg/dL (ref 70–99)
Potassium: 4.2 mmol/L (ref 3.5–5.2)
Sodium: 145 mmol/L — ABNORMAL HIGH (ref 134–144)
Total Protein: 7 g/dL (ref 6.0–8.5)
eGFR: 67 mL/min/{1.73_m2} (ref >59)

## 2022-01-22 LAB — CBC WITH DIFFERENTIAL/PLATELET
Basophils Absolute: 0 10*3/uL (ref 0.0–0.2)
Basos: 0 %
EOS (ABSOLUTE): 0 10*3/uL (ref 0.0–0.4)
Eos: 0 %
Hematocrit: 43.9 % (ref 37.5–51.0)
Hemoglobin: 14.9 g/dL (ref 13.0–17.7)
Immature Grans (Abs): 0 10*3/uL (ref 0.0–0.1)
Immature Granulocytes: 0 %
Lymphocytes Absolute: 1 10*3/uL (ref 0.7–3.1)
Lymphs: 12 %
MCH: 29.2 pg (ref 26.6–33.0)
MCHC: 33.9 g/dL (ref 31.5–35.7)
MCV: 86 fL (ref 79–97)
Monocytes Absolute: 0.5 10*3/uL (ref 0.1–0.9)
Monocytes: 6 %
Neutrophils Absolute: 6.9 10*3/uL (ref 1.4–7.0)
Neutrophils: 82 %
Platelets: 174 10*3/uL (ref 150–450)
RBC: 5.11 x10E6/uL (ref 4.14–5.80)
RDW: 12 % (ref 11.6–15.4)
WBC: 8.5 10*3/uL (ref 3.4–10.8)

## 2022-01-22 LAB — VITAMIN D 25 HYDROXY (VIT D DEFICIENCY, FRACTURES): Vit D, 25-Hydroxy: 52.6 ng/mL (ref 30.0–100.0)

## 2022-01-22 LAB — MICROSCOPIC EXAMINATION
Bacteria, UA: NONE SEEN
Casts: NONE SEEN /lpf
Epithelial Cells (non renal): NONE SEEN /hpf (ref 0–10)
WBC, UA: NONE SEEN /hpf (ref 0–5)

## 2022-01-22 LAB — LIPID PANEL
Chol/HDL Ratio: 2.3 ratio (ref 0.0–5.0)
Cholesterol, Total: 104 mg/dL (ref 100–199)
HDL: 45 mg/dL
LDL Chol Calc (NIH): 40 mg/dL (ref 0–99)
Triglycerides: 101 mg/dL (ref 0–149)
VLDL Cholesterol Cal: 19 mg/dL (ref 5–40)

## 2022-01-22 LAB — TSH+FREE T4
Free T4: 1.21 ng/dL (ref 0.82–1.77)
TSH: 0.373 u[IU]/mL — ABNORMAL LOW (ref 0.450–4.500)

## 2022-01-23 ENCOUNTER — Telehealth: Payer: Self-pay

## 2022-01-23 NOTE — Telephone Encounter (Signed)
-----   Message from Jonetta Osgood, NP sent at 01/23/2022  6:39 AM EST ----- Please call patient with results: -Sodium level is still slightly elevated, may need to drink more water.  -thyroid levels are stable -CBC is normal -cholesterol levels are normal -vitamin D level is normal -B12 and folate levels are normal

## 2022-01-23 NOTE — Progress Notes (Signed)
Please call patient with results: -Sodium level is still slightly elevated, may need to drink more water.  -thyroid levels are stable -CBC is normal -cholesterol levels are normal -vitamin D level is normal -B12 and folate levels are normal

## 2022-01-23 NOTE — Telephone Encounter (Signed)
LMOM to review results ?

## 2022-01-23 NOTE — Telephone Encounter (Signed)
Notified patient's sister about lab results on 01/23/2022.

## 2022-02-02 ENCOUNTER — Other Ambulatory Visit: Payer: Self-pay

## 2022-02-02 DIAGNOSIS — Z87891 Personal history of nicotine dependence: Secondary | ICD-10-CM

## 2022-02-02 DIAGNOSIS — F17210 Nicotine dependence, cigarettes, uncomplicated: Secondary | ICD-10-CM

## 2022-02-13 ENCOUNTER — Encounter: Payer: Self-pay | Admitting: Nurse Practitioner

## 2022-02-17 ENCOUNTER — Telehealth: Payer: Self-pay | Admitting: Student-PharmD

## 2022-02-17 ENCOUNTER — Ambulatory Visit: Admission: RE | Admit: 2022-02-17 | Payer: Medicare Other | Source: Ambulatory Visit

## 2022-02-17 DIAGNOSIS — I10 Essential (primary) hypertension: Secondary | ICD-10-CM

## 2022-02-17 DIAGNOSIS — J449 Chronic obstructive pulmonary disease, unspecified: Secondary | ICD-10-CM

## 2022-02-17 NOTE — Chronic Care Management (AMB) (Signed)
Chronic Care Management   Pharmacy Note  02/17/2022 Name: Ian Conley MRN: [id number] DOB: [birth year]  Referred by: Ian Guise, MD Reason for referral : RPM Monthly Review       [place]  Name: Ian Conley  Date of Birth: Aug 18, 1951  Gender: Male  Primary provider: Clayborn Bigness  RPM Coordinator: Charlann Lange  ICD Code: I10 Device ID: [id number] Needles: [employer] Measurement Type: blood pressure Days of Monitoring: 16 Documentation Time: 40 Min 0 Sec BLOOD PRESSURE  Measurement Date: 02/17/2022 Tuesday at 32:20 PM Systolic / Diastolic (mmHg): 254 / 65 Measurement Date: 02/16/2022 Monday at 27:06 PM Systolic / Diastolic (mmHg): 237 / 76 Measurement Date: 02/15/2022 Sunday at 62:83 PM Systolic / Diastolic (mmHg): 151 / 67 Measurement Date: 02/14/2022 Saturday at 76:16 PM Systolic / Diastolic (mmHg): 073 / 71 Measurement Date: 02/13/2022 Friday at 71:06 PM Systolic / Diastolic (mmHg): 269 / 78 Measurement Date: 02/12/2022 Thursday at 48:54 PM Systolic / Diastolic (mmHg): 627 / 69 Measurement Date: 02/11/2022 Wednesday at 03:50 PM Systolic / Diastolic (mmHg): 093 / 59 Measurement Date: 02/08/2022 Sunday at 81:82 PM Systolic / Diastolic (mmHg): 993 / 68 Measurement Date: 02/05/2022 Thursday at 71:69 PM Systolic / Diastolic (mmHg): 678 / 68 Measurement Date: 02/03/2022 Tuesday at 93:81 PM Systolic / Diastolic (mmHg): 017 / 73 Measurement Date: 02/03/2022 Tuesday at 51:02 PM Systolic / Diastolic (mmHg): 585 / 80 Measurement Date: 02/01/2022 Sunday at 27:78 PM Systolic / Diastolic (mmHg): 242 / 66 Measurement Date: 01/30/2022 Friday at 35:36 PM Systolic / Diastolic (mmHg): 144 / 73 Measurement Date: 01/28/2022 Wednesday at 31:54 AM Systolic / Diastolic (mmHg): 008 / 68 Measurement Date: 01/26/2022 Monday at 67:61 PM Systolic / Diastolic (mmHg): 950 / 65 Measurement Date: 01/25/2022 Sunday at 93:26 PM Systolic / Diastolic (mmHg): 712 / 63 Measurement Date:  01/23/2022 Friday at 45:80 AM Systolic / Diastolic (mmHg): 998 / 78  PULSE  Measurement Date: 02/17/2022 Tuesday at 04:23 PM Pulse (IN BPM): 72 Measurement Date: 02/16/2022 Monday at 04:01 PM Pulse (IN BPM): 78 Measurement Date: 02/15/2022 Sunday at 08:48 PM Pulse (IN BPM): 65 Measurement Date: 02/14/2022 Saturday at 08:57 PM Pulse (IN BPM): 64 Measurement Date: 02/13/2022 Friday at 04:20 PM Pulse (IN BPM): 73 Measurement Date: 02/12/2022 Thursday at 06:33 PM Pulse (IN BPM): 75 Measurement Date: 02/11/2022 Wednesday at 02:45 PM Pulse (IN BPM): 74 Measurement Date: 02/08/2022 Sunday at 06:28 PM Pulse (IN BPM): 71 Measurement Date: 02/05/2022 Thursday at 01:00 PM Pulse (IN BPM): 71 Measurement Date: 02/03/2022 Tuesday at 04:16 PM Pulse (IN BPM): 66 Measurement Date: 02/03/2022 Tuesday at 04:13 PM Pulse (IN BPM): 69 Measurement Date: 02/01/2022 Sunday at 08:03 PM Pulse (IN BPM): 67 Measurement Date: 01/30/2022 Friday at 03:24 PM Pulse (IN BPM): 78 Measurement Date: 01/28/2022 Wednesday at 11:37 AM Pulse (IN BPM): 71 Measurement Date: 01/26/2022 Monday at 09:23 PM Pulse (IN BPM): 66 Measurement Date: 01/25/2022 Sunday at 07:40 PM Pulse (IN BPM): 70 Measurement Date: 01/23/2022 Friday at 11:48 AM Pulse (IN BPM): 67          Notes for February-2023 : 40 Min 0 Sec  Date and Time: 02/17/2022 Tuesday at 04:43 PM User: Alena Bills Time logged: 10:00 Notes: DATA REVIEW SYSTOLIC BP  Automatically transmitted data is reviewed today from device. (ID: [id number]).  Systolic BP reading is >767 for 0 % of time  Systolic BP reading is between 160 to 179 for 0 % of time  Systolic BP reading is between 140 to 159 for 11.76 %  of time  Systolic BP reading is between 120-139 for 70.59 % of time  Systolic BP reading is between 101-120 for 23.53 % of time  Systolic BP reading is <737 for 0 % of time  DIASTOLIC BP  Diastolic BP reading is elevated >100 for 0 % of  time  Diastolic BP reading is between 90-99 for 0 % of time  PULSE  Pulse is >120 for 0 % of time  Pulse is <50 for 0 % of time  COMPLIANCE NOTES BLOOD PRESSURE  Patient is compliant - 16 days of readings obtained.  MANAGEMENT NOTES We will continue to monitor BP readings for now  Medications for BP reviewed for this patient  No change on the treatment plan   Date and Time: 02/17/2022 Tuesday at 11:32 AM User: Charlann Lange Time logged: 06:00 Notes: REVIEW NOTES All the daily Blood pressure data noted since last review  Heart rate/Pulse data reviewed   Date and Time: 02/13/2022 Friday at 02:15 PM User: Charlann Lange Time logged: 08:00 Notes: REVIEW NOTES All the daily Blood pressure data noted since last review  Heart rate/Pulse data reviewed   Date and Time: 02/04/2022 Wednesday at 04:21 PM User: Charlann Lange Time logged: 04:00 Notes: REVIEW NOTES All the daily Blood pressure data noted since last review  Heart rate/Pulse data reviewed   Date and Time: 02/02/2022 Monday at 12:23 PM User: Charlann Lange Time logged: 04:00 Notes: REVIEW NOTES All the daily Blood pressure data noted since last review  Heart rate/Pulse data reviewed   Date and Time: 01/29/2022 Thursday at 02:21 PM User: Charlann Lange Time logged: 02:00 Notes: REVIEW NOTES All the daily Blood pressure data noted since last review  Heart rate/Pulse data reviewed   Date and Time: 01/27/2022 Tuesday at 04:21 PM User: Charlann Lange Time logged: 02:00 Notes: REVIEW NOTES All the daily Blood pressure data noted since last review  Heart rate/Pulse data reviewed   Date and Time: 01/26/2022 Monday at 03:39 PM User: Charlann Lange Time logged: 04:00 Notes: REVIEW NOTES All the daily Blood pressure data noted since last review  Heart rate/Pulse data reviewed  Verbal Consent Obtained on March 27, 2021. Patient was notified about Remote Patient  Monitoring (RPM) program offered by our medical practice to improve the patient care, co-ordination of care, patient education, provide quality of care, reduce the cost and provide personalized care. Patient was informed that this is Medicare approved program and can be furnished by only one provider, can be stopped by the patient anytime. Patient has verbally consented to enroll in this program and our designated care coordinator to contact by phone as needed at least for 20 minutes a month. Practice also provides 24/7 care to the patient needs. All patient questions were answered and patient was enrolled in the RPM program.   10 minutes spent in review, coordination, and documentation.  Reviewed by: Alena Bills, PharmD Clinical Pharmacist [phone]

## 2022-03-10 ENCOUNTER — Telehealth: Payer: Self-pay | Admitting: Student-PharmD

## 2022-03-10 NOTE — Progress Notes (Signed)
?  Chronic Care Management ?Pharmacy Assistant  ? ?Name: Ian Conley  MRN: [id number] DOB: 1951-01-31 ? ?Reason for Encounter: Medication Review/Medication Coordination Call ? ?Medications: ?Outpatient Encounter Medications as of 03/10/2022  ?Medication Sig  ? amLODipine (NORVASC) 5 MG tablet Take 1 tablet (5 mg total) by mouth daily.  ? atorvastatin (LIPITOR) 80 MG tablet TAKE 1 TABLET(80 MG) BY MOUTH DAILY AT 6 PM  ? CLARITIN 10 MG CAPS Take by mouth daily.  ? dipyridamole-aspirin (AGGRENOX) 200-25 MG 12hr capsule Take 1 capsule by mouth 2 (two) times daily.  ? donepezil (ARICEPT) 10 MG tablet Take 1 tablet (10 mg total) by mouth daily.  ? Fluticasone-Umeclidin-Vilant (TRELEGY ELLIPTA) 100-62.5-25 MCG/INH AEPB Inhale 1 puff into the lungs daily.  ? LORazepam (ATIVAN) 0.5 MG tablet TAKE ONE TABLET BY MOUTH EVERY MORNING and TAKE 1/2 TABLET BY MOUTH EVERYDAY AT BEDTIME  ? memantine (NAMENDA) 10 MG tablet Take 1 tablet (10 mg total) by mouth 2 (two) times daily.  ? mirtazapine (REMERON) 7.5 MG tablet TAKE 1 TABLET(7.5 MG) BY MOUTH AT BEDTIME  ? Multiple Vitamins-Minerals (YOUR LIFE MULTI MENS 50+) TABS Take by mouth.  ? polyethylene glycol powder (GLYCOLAX/MIRALAX) powder as needed.   ? ?No facility-administered encounter medications on file as of 03/10/2022.  ? ?Reviewed chart for medication changes ahead of medication coordination call. ? ?No Consults, or hospital visits since last care coordination call. ? ?Office  Visit: ?01/21/22 Jonetta Osgood, NP. For Encounter for general adult medical examination. No medication changes.  ? ?No medication changes indicated. ? ?BP Readings from Last 3 Encounters:  ?01/21/22 140/62  ?08/28/21 100/62  ?06/24/21 108/62  ?  ?Lab Results  ?Component Value Date  ? HGBA1C 6.2 (H) 07/15/2018  ?  ? ?Patient obtains medications through Adherence Packaging  90 Days  ? ?Last adherence delivery included: ?Amodipine 5 mg 1 tablet for breakfast  ?Atorvastatin 80 mg 1 tablet at  bedtime ?Aggrenox 200/25 mg 1 tablet at breakfast and 1 tablet at bedtime ?Donepezil 10 mg 1 tablet at bedtime ?Memantine 10 mg 1 tablet breakfast and 1 tablet at bedtime  ?Mirtazapine 7.5 mg 1 tablet bedtime  ?Lorazepam 0.5 mg 1 tablet at breakfast and 1/2 (Pre-Cut) tablet at bedtime ?Claritin 10 mg 1 tablet for breakfast ?Your life Mulitple Men 1 tabet for breakfast ? ?Patient declined meds last month: ?None.  ? ?Patient is due for next adherence delivery on: 03/20/22. ? ?Called patient and reviewed medications and coordinated delivery. ? ?This delivery to include: ?Amodipine 5 mg 1 tablet for breakfast  ?Atorvastatin 80 mg 1 tablet at bedtime ?Aggrenox 200/25 mg 1 tablet at breakfast and 1 tablet at bedtime ?Donepezil 10 mg 1 tablet at bedtime ?Memantine 10 mg 1 tablet breakfast and 1 tablet at bedtime  ?Mirtazapine 7.5 mg 1 tablet bedtime  ?Lorazepam 0.5 mg 1 tablet at breakfast and 1/2 (Pre-Cut) tablet at bedtime ?Claritin 10 mg 1 tablet for breakfast ?Your life Mulitple Men 1 tabet for breakfast ? ?Patient declined the following medications: ?None. ? ?Patient does not need refills at this time.  ? ?Confirmed delivery date of 03/20/22, advised patient that pharmacy will contact them the morning of delivery. ? ?Time: 20 min ? ?Charlann Lange, RMA ?Healthcare Concierge  ?[phone] ? ?

## 2022-03-19 ENCOUNTER — Other Ambulatory Visit: Payer: Self-pay | Admitting: Internal Medicine

## 2022-04-22 ENCOUNTER — Ambulatory Visit: Payer: Medicare Other | Admitting: Nurse Practitioner

## 2022-05-13 ENCOUNTER — Telehealth: Payer: Medicare Other

## 2022-06-18 ENCOUNTER — Other Ambulatory Visit: Payer: Self-pay | Admitting: Nurse Practitioner

## 2022-06-18 DIAGNOSIS — F01518 Vascular dementia, unspecified severity, with other behavioral disturbance: Secondary | ICD-10-CM

## 2022-06-19 NOTE — Telephone Encounter (Signed)
Last here 2/23 and send  message to [facility] for appt

## 2022-06-24 ENCOUNTER — Encounter: Payer: Self-pay | Admitting: Nurse Practitioner

## 2022-06-24 ENCOUNTER — Ambulatory Visit (INDEPENDENT_AMBULATORY_CARE_PROVIDER_SITE_OTHER): Payer: Medicare Other | Admitting: Nurse Practitioner

## 2022-06-24 VITALS — BP 124/65 | HR 87 | Temp 98.3°F | Resp 16 | Ht 71.0 in | Wt 151.8 lb

## 2022-06-24 DIAGNOSIS — R5383 Other fatigue: Secondary | ICD-10-CM | POA: Diagnosis not present

## 2022-06-24 DIAGNOSIS — Z760 Encounter for issue of repeat prescription: Secondary | ICD-10-CM

## 2022-06-24 DIAGNOSIS — Z125 Encounter for screening for malignant neoplasm of prostate: Secondary | ICD-10-CM

## 2022-06-24 DIAGNOSIS — R7989 Other specified abnormal findings of blood chemistry: Secondary | ICD-10-CM | POA: Diagnosis not present

## 2022-06-24 DIAGNOSIS — F321 Major depressive disorder, single episode, moderate: Secondary | ICD-10-CM

## 2022-06-24 DIAGNOSIS — F01518 Vascular dementia, unspecified severity, with other behavioral disturbance: Secondary | ICD-10-CM

## 2022-06-24 DIAGNOSIS — Z23 Encounter for immunization: Secondary | ICD-10-CM

## 2022-06-24 DIAGNOSIS — Z8673 Personal history of transient ischemic attack (TIA), and cerebral infarction without residual deficits: Secondary | ICD-10-CM

## 2022-06-24 DIAGNOSIS — I700 Atherosclerosis of aorta: Secondary | ICD-10-CM

## 2022-06-24 MED ORDER — DONEPEZIL HCL 10 MG PO TABS: 10.0000 mg | ORAL_TABLET | Freq: Every day | ORAL | 3 refills | Status: DC

## 2022-06-24 MED ORDER — MEMANTINE HCL 10 MG PO TABS: 10.0000 mg | ORAL_TABLET | Freq: Two times a day (BID) | ORAL | 3 refills | Status: DC

## 2022-06-24 MED ORDER — PNEUMOCOCCAL 20-VAL CONJ VACC 0.5 ML IM SUSY: 0.5000 mL | PREFILLED_SYRINGE | INTRAMUSCULAR | 0 refills | Status: AC

## 2022-06-24 MED ORDER — MIRTAZAPINE 7.5 MG PO TABS: ORAL_TABLET | ORAL | 3 refills | Status: DC

## 2022-06-24 MED ORDER — ATORVASTATIN CALCIUM 80 MG PO TABS: ORAL_TABLET | ORAL | 3 refills | Status: DC

## 2022-06-24 MED ORDER — ASPIRIN-DIPYRIDAMOLE ER 25-200 MG PO CP12: 1.0000 | ORAL_CAPSULE | Freq: Two times a day (BID) | ORAL | 3 refills | Status: DC

## 2022-06-24 NOTE — Progress Notes (Signed)
[facility] [place], [place] [address]  Internal MEDICINE  Office Visit Note  Patient Name: Ian Conley  [id number]  [id number]  Date of Service: 06/24/2022  Chief Complaint  Patient presents with   Follow-up   Hypertension   Quality Metric Gaps    Pneumonia Vaccine    HPI Meir presents for a follow-up visit for hypertension, and fatigue.  He also needs refills of medications. -- Patient reports feeling tired, fatigue, low to no energy but reports that he is getting enough sleep and good quality sleep. -- Reports poor appetite due to poor dentition. -- Is well overdue for labs and has agreed to get them drawn, has a family member accompanying him today -- Blood pressure is well controlled with current medication     Current Medication: Outpatient Encounter Medications as of 06/24/2022  Medication Sig   amLODipine (NORVASC) 5 MG tablet Take 1 tablet (5 mg total) by mouth daily.   CLARITIN 10 MG CAPS Take by mouth daily.   LORazepam (ATIVAN) 0.5 MG tablet TAKE ONE TABLET BY MOUTH EVERY MORNING and TAKE 1/2 TABLET BY MOUTH EVERYDAY AT BEDTIME   Multiple Vitamins-Minerals (YOUR LIFE MULTI MENS 50+) TABS Take by mouth.   pneumococcal 20-valent conjugate vaccine (PREVNAR 20) 0.5 ML injection Inject 0.5 mLs into the muscle tomorrow at 10 am for 1 dose.   polyethylene glycol powder (GLYCOLAX/MIRALAX) powder as needed.    [DISCONTINUED] atorvastatin (LIPITOR) 80 MG tablet TAKE 1 TABLET(80 MG) BY MOUTH DAILY AT 6 PM   [DISCONTINUED] dipyridamole-aspirin (AGGRENOX) 200-25 MG 12hr capsule Take 1 capsule by mouth 2 (two) times daily.   [DISCONTINUED] donepezil (ARICEPT) 10 MG tablet Take 1 tablet (10 mg total) by mouth daily.   [DISCONTINUED] Fluticasone-Umeclidin-Vilant (TRELEGY ELLIPTA) 100-62.5-25 MCG/INH AEPB Inhale 1 puff into the lungs daily.   [DISCONTINUED] memantine (NAMENDA) 10 MG tablet Take 1 tablet (10 mg total) by mouth 2 (two) times daily.    [DISCONTINUED] mirtazapine (REMERON) 7.5 MG tablet TAKE 1 TABLET(7.5 MG) BY MOUTH AT BEDTIME   atorvastatin (LIPITOR) 80 MG tablet TAKE 1 TABLET(80 MG) BY MOUTH DAILY AT 6 PM   dipyridamole-aspirin (AGGRENOX) 200-25 MG 12hr capsule Take 1 capsule by mouth 2 (two) times daily.   donepezil (ARICEPT) 10 MG tablet Take 1 tablet (10 mg total) by mouth daily.   memantine (NAMENDA) 10 MG tablet Take 1 tablet (10 mg total) by mouth 2 (two) times daily.   mirtazapine (REMERON) 7.5 MG tablet TAKE 1 TABLET(7.5 MG) BY MOUTH AT BEDTIME   No facility-administered encounter medications on file as of 06/24/2022.    Surgical History: History reviewed. No pertinent surgical history.  Medical History: Past Medical History:  Diagnosis Date   Allergy    Anemia    Hypertension    Pancreatitis    Personal history of tobacco use, presenting hazards to health 03/25/2016   Polycythemia    Stroke [facility])    2008 9/11    Family History: Family History  Problem Relation Age of Onset   Diabetes Mother    Heart failure Mother    Cancer Father        Throat    Stroke Father    Hypertension Father    HIV Brother    Thyroid disease Sister        thyroid cancer   Hypertension Sister        both sisters    Social History   Socioeconomic History   Marital status:  Widowed    Spouse name: Not on file   Number of children: Not on file   Years of education: Not on file   Highest education level: Not on file  Occupational History   Not on file  Tobacco Use   Smoking status: Every Day    Packs/day: 1.00    Years: 45.00    Total pack years: 45.00    Types: Cigarettes   Smokeless tobacco: Never   Tobacco comments:    1 pack per week  Vaping Use   Vaping Use: Never used  Substance and Sexual Activity   Alcohol use: Not Currently    Comment: when pt was at home, no alcohol since July, 2019   Drug use: No   Sexual activity: Not on file  Other Topics Concern   Not on file  Social History Narrative    Not on file   Social Determinants of Health   Financial Resource Strain: Not on file  Food Insecurity: Not on file  Transportation Needs: Not on file  Physical Activity: Not on file  Stress: Not on file  Social Connections: Not on file  Intimate Partner Violence: Not on file      Review of Systems  Constitutional:  Positive for activity change, appetite change and fatigue. Negative for chills and unexpected weight change.  HENT:  Negative for congestion, rhinorrhea, sneezing and sore throat.   Eyes:  Negative for redness.  Respiratory: Negative.  Negative for cough, chest tightness, shortness of breath and wheezing.   Cardiovascular: Negative.  Negative for chest pain and palpitations.  Gastrointestinal: Negative.  Negative for abdominal pain, constipation, diarrhea, nausea and vomiting.  Genitourinary:  Negative for dysuria and frequency.  Musculoskeletal:  Positive for arthralgias. Negative for back pain, joint swelling and neck pain.  Skin:  Negative for rash.  Neurological: Negative.  Negative for tremors and numbness.  Hematological:  Negative for adenopathy. Does not bruise/bleed easily.  Psychiatric/Behavioral:  Negative for behavioral problems (Depression), sleep disturbance and suicidal ideas. The patient is not nervous/anxious.     Vital Signs: BP 124/65   Pulse 87   Temp 98.3 F (36.8 C)   Resp 16   Ht '5\' 11"'$  (1.803 m)   Wt 151 lb 12.8 oz (68.9 kg)   SpO2 96%   BMI 21.17 kg/m    Physical Exam Vitals reviewed.  Constitutional:      General: He is not in acute distress.    Appearance: Normal appearance. He is normal weight. He is not ill-appearing.  HENT:     Head: Normocephalic and atraumatic.     Mouth/Throat:     Lips: Pink.     Mouth: Mucous membranes are moist.     Dentition: Abnormal dentition. Dental caries present.     Pharynx: Oropharynx is clear. Uvula midline.  Eyes:     Pupils: Pupils are equal, round, and reactive to light.   Cardiovascular:     Rate and Rhythm: Normal rate and regular rhythm.     Heart sounds: Normal heart sounds. No murmur heard. Pulmonary:     Effort: Pulmonary effort is normal. No respiratory distress.     Breath sounds: Normal breath sounds. No wheezing.  Neurological:     Mental Status: He is alert and oriented to person, place, and time.  Psychiatric:        Mood and Affect: Mood normal.        Behavior: Behavior normal.        Assessment/Plan:  1. Other fatigue Rule out thyroid problems, iron deficiency and impaired glucose.  - TSH + free T4 - T3 - Thyroglobulin antibody - Thyroid peroxidase antibody - Iron, TIBC and Ferritin Panel - Hgb A1C w/o eAG  2. Low serum thyroid stimulating hormone (TSH) Hx low serum TSH, repeat and additional labs ordered.  - TSH + free T4 - T3 - Thyroglobulin antibody - Thyroid peroxidase antibody  3. Need for vaccination - pneumococcal 20-valent conjugate vaccine (PREVNAR 20) 0.5 ML injection; Inject 0.5 mLs into the muscle tomorrow at 10 am for 1 dose.  Dispense: 0.5 mL; Refill: 0  4. Medication refill - memantine (NAMENDA) 10 MG tablet; Take 1 tablet (10 mg total) by mouth 2 (two) times daily.  Dispense: 180 tablet; Refill: 3 - mirtazapine (REMERON) 7.5 MG tablet; TAKE 1 TABLET(7.5 MG) BY MOUTH AT BEDTIME  Dispense: 90 tablet; Refill: 3 - donepezil (ARICEPT) 10 MG tablet; Take 1 tablet (10 mg total) by mouth daily.  Dispense: 90 tablet; Refill: 3 - atorvastatin (LIPITOR) 80 MG tablet; TAKE 1 TABLET(80 MG) BY MOUTH DAILY AT 6 PM  Dispense: 90 tablet; Refill: 3 - dipyridamole-aspirin (AGGRENOX) 200-25 MG 12hr capsule; Take 1 capsule by mouth 2 (two) times daily.  Dispense: 180 capsule; Refill: 3  5. Encounter for prostate cancer screening - PSA Total (Reflex To Free)   General Counseling: Treyce verbalizes understanding of the findings of todays visit and agrees with plan of treatment. I have discussed any further diagnostic evaluation  that may be needed or ordered today. We also reviewed his medications today. he has been encouraged to call the office with any questions or concerns that should arise related to todays visit.    Orders Placed This Encounter  Procedures   TSH + free T4   T3   Thyroglobulin antibody   Thyroid peroxidase antibody   Iron, TIBC and Ferritin Panel   PSA Total (Reflex To Free)   Hgb A1C w/o eAG    Meds ordered this encounter  Medications   pneumococcal 20-valent conjugate vaccine (PREVNAR 20) 0.5 ML injection    Sig: Inject 0.5 mLs into the muscle tomorrow at 10 am for 1 dose.    Dispense:  0.5 mL    Refill:  0   memantine (NAMENDA) 10 MG tablet    Sig: Take 1 tablet (10 mg total) by mouth 2 (two) times daily.    Dispense:  180 tablet    Refill:  3   mirtazapine (REMERON) 7.5 MG tablet    Sig: TAKE 1 TABLET(7.5 MG) BY MOUTH AT BEDTIME    Dispense:  90 tablet    Refill:  3   donepezil (ARICEPT) 10 MG tablet    Sig: Take 1 tablet (10 mg total) by mouth daily.    Dispense:  90 tablet    Refill:  3   atorvastatin (LIPITOR) 80 MG tablet    Sig: TAKE 1 TABLET(80 MG) BY MOUTH DAILY AT 6 PM    Dispense:  90 tablet    Refill:  3   dipyridamole-aspirin (AGGRENOX) 200-25 MG 12hr capsule    Sig: Take 1 capsule by mouth 2 (two) times daily.    Dispense:  180 capsule    Refill:  3    Return in about 3 months (around 09/14/2022) for F/U, med refill, Mayes Sangiovanni PCP for lorazepam. Patient informed that he needs to be seen in office every 3 months to continue filling lorazepam prescription.  Total time spent:30 Minutes Time spent includes  review of chart, medications, test results, and follow up plan with the patient.   [place] Controlled Substance Database was reviewed by me.  This patient was seen by Jonetta Osgood, FNP-C in collaboration with Dr. Clayborn Bigness as a part of collaborative care agreement.   Shir Bergman R. Valetta Fuller, MSN, FNP-C Internal medicine

## 2022-06-26 LAB — TSH+FREE T4
Free T4: 1.14 ng/dL (ref 0.82–1.77)
TSH: 0.353 u[IU]/mL — ABNORMAL LOW (ref 0.450–4.500)

## 2022-06-26 LAB — THYROGLOBULIN ANTIBODY: Thyroglobulin Antibody: <1 IU/mL (ref 0.0–0.9)

## 2022-06-26 LAB — T3: T3, Total: 125 ng/dL (ref 71–180)

## 2022-06-26 LAB — IRON,TIBC AND FERRITIN PANEL
Ferritin: 423 ng/mL — ABNORMAL HIGH (ref 30–400)
Iron Saturation: 34 % (ref 15–55)
Iron: 71 ug/dL (ref 38–169)
Total Iron Binding Capacity: 206 ug/dL — ABNORMAL LOW (ref 250–450)
UIBC: 135 ug/dL (ref 111–343)

## 2022-06-26 LAB — PSA TOTAL (REFLEX TO FREE): Prostate Specific Ag, Serum: 0.5 ng/mL (ref 0.0–4.0)

## 2022-06-26 LAB — THYROID PEROXIDASE ANTIBODY: Thyroperoxidase Ab SerPl-aCnc: 14 IU/mL (ref 0–34)

## 2022-06-26 LAB — HGB A1C W/O EAG: Hgb A1c MFr Bld: 5.8 % — ABNORMAL HIGH (ref 4.8–5.6)

## 2022-07-22 NOTE — Progress Notes (Signed)
Please call the patient and let him know that his A1c has improved slightly and is 5.8 -His iron panel is normal except for slightly elevated ferritin level but this is not clinically significant right now. His PSA level is 0.5 which is normal His TSH level is still low at 0.353 but his free T4, total T3 and his thyroid antibodies were all normal.  With a low TSH and no other abnormal labs, it would be a good idea to have further evaluation by an endocrinologist. Please let me know if the patient would like for me to send a referral to endocrinology

## 2022-07-23 ENCOUNTER — Telehealth: Payer: Self-pay

## 2022-07-23 NOTE — Telephone Encounter (Signed)
-----   Message from Jonetta Osgood, NP sent at 07/22/2022  5:30 PM EDT ----- Please call the patient and let him know that his A1c has improved slightly and is 5.8 -His iron panel is normal except for slightly elevated ferritin level but this is not clinically significant right now. His PSA level is 0.5 which is normal His TSH level is still low at 0.353 but his free T4, total T3 and his thyroid antibodies were all normal.  With a low TSH and no other abnormal labs, it would be a good idea to have further evaluation by an endocrinologist. Please let me know if the patient would like for me to send a referral to endocrinology

## 2022-08-07 ENCOUNTER — Telehealth: Payer: Self-pay

## 2022-08-07 NOTE — Telephone Encounter (Signed)
MR 07/20/21, 08/20/21, 09/19/21, 10/20/21 for claim review faxed back to [facility]

## 2022-08-09 ENCOUNTER — Encounter: Payer: Self-pay | Admitting: Nurse Practitioner

## 2022-08-21 ENCOUNTER — Other Ambulatory Visit: Payer: Self-pay | Admitting: Nurse Practitioner

## 2022-08-21 ENCOUNTER — Telehealth: Payer: Self-pay | Admitting: Nurse Practitioner

## 2022-08-21 DIAGNOSIS — R634 Abnormal weight loss: Secondary | ICD-10-CM

## 2022-08-21 DIAGNOSIS — R7989 Other specified abnormal findings of blood chemistry: Secondary | ICD-10-CM

## 2022-08-21 DIAGNOSIS — R5383 Other fatigue: Secondary | ICD-10-CM

## 2022-08-21 NOTE — Telephone Encounter (Signed)
Endocrinology referral faxed to Dr. Rayburn Ma

## 2022-08-21 NOTE — Progress Notes (Signed)
Endo

## 2022-09-08 DIAGNOSIS — E785 Hyperlipidemia, unspecified: Secondary | ICD-10-CM | POA: Diagnosis not present

## 2022-09-08 DIAGNOSIS — E032 Hypothyroidism due to medicaments and other exogenous substances: Secondary | ICD-10-CM | POA: Diagnosis not present

## 2022-09-08 DIAGNOSIS — F0394 Unspecified dementia, unspecified severity, with anxiety: Secondary | ICD-10-CM | POA: Diagnosis not present

## 2022-09-08 DIAGNOSIS — R5383 Other fatigue: Secondary | ICD-10-CM | POA: Diagnosis not present

## 2022-09-08 DIAGNOSIS — E049 Nontoxic goiter, unspecified: Secondary | ICD-10-CM | POA: Diagnosis not present

## 2022-09-08 DIAGNOSIS — E231 Drug-induced hypopituitarism: Secondary | ICD-10-CM | POA: Diagnosis not present

## 2022-09-09 ENCOUNTER — Other Ambulatory Visit: Payer: Self-pay | Admitting: Nurse Practitioner

## 2022-09-09 DIAGNOSIS — F01518 Vascular dementia, unspecified severity, with other behavioral disturbance: Secondary | ICD-10-CM

## 2022-09-10 ENCOUNTER — Other Ambulatory Visit: Payer: Self-pay | Admitting: Nurse Practitioner

## 2022-09-10 DIAGNOSIS — F01518 Vascular dementia, unspecified severity, with other behavioral disturbance: Secondary | ICD-10-CM

## 2022-09-10 NOTE — Telephone Encounter (Signed)
Refilled medicine, will need UDS

## 2022-09-21 DIAGNOSIS — E785 Hyperlipidemia, unspecified: Secondary | ICD-10-CM | POA: Diagnosis not present

## 2022-09-21 DIAGNOSIS — G459 Transient cerebral ischemic attack, unspecified: Secondary | ICD-10-CM | POA: Diagnosis not present

## 2022-09-21 DIAGNOSIS — E231 Drug-induced hypopituitarism: Secondary | ICD-10-CM | POA: Diagnosis not present

## 2022-09-21 DIAGNOSIS — F0394 Unspecified dementia, unspecified severity, with anxiety: Secondary | ICD-10-CM | POA: Diagnosis not present

## 2022-09-23 ENCOUNTER — Ambulatory Visit: Payer: Medicare Other | Admitting: Nurse Practitioner

## 2023-01-11 ENCOUNTER — Other Ambulatory Visit: Payer: Self-pay | Admitting: Nurse Practitioner

## 2023-01-11 ENCOUNTER — Other Ambulatory Visit: Payer: Self-pay | Admitting: Internal Medicine

## 2023-01-11 DIAGNOSIS — F01518 Vascular dementia, unspecified severity, with other behavioral disturbance: Secondary | ICD-10-CM

## 2023-01-11 DIAGNOSIS — I10 Essential (primary) hypertension: Secondary | ICD-10-CM

## 2023-01-11 NOTE — Telephone Encounter (Signed)
Last8/23 next 2/24 3 no show

## 2023-01-13 ENCOUNTER — Ambulatory Visit: Payer: Medicare Other | Admitting: Nurse Practitioner

## 2023-01-28 ENCOUNTER — Ambulatory Visit: Payer: Medicare Other | Admitting: Nurse Practitioner

## 2023-01-28 ENCOUNTER — Encounter: Payer: Self-pay | Admitting: Nurse Practitioner

## 2023-01-28 ENCOUNTER — Telehealth (INDEPENDENT_AMBULATORY_CARE_PROVIDER_SITE_OTHER): Payer: Medicare Other | Admitting: Nurse Practitioner

## 2023-01-28 DIAGNOSIS — F01518 Vascular dementia, unspecified severity, with other behavioral disturbance: Secondary | ICD-10-CM | POA: Diagnosis not present

## 2023-01-28 MED ORDER — LORAZEPAM 0.5 MG PO TABS: ORAL_TABLET | ORAL | 1 refills | Status: DC

## 2023-01-28 NOTE — Progress Notes (Signed)
[facility] [place], [place] [address]  Internal MEDICINE  Telephone Visit  Patient Name: Ian Conley  [id number]  [id number]  Date of Service: 01/28/2023  I connected with the patient at 1430 by telephone and verified the patients identity using two identifiers.   I discussed the limitations, risks, security and privacy concerns of performing an evaluation and management service by telephone and the availability of in person appointments. I also discussed with the patient that there may be a patient responsible charge related to the service.  The patient expressed understanding and agrees to proceed.    Chief Complaint  Patient presents with   Telephone Screen    Med refills [phone]   Telephone Assessment    HPI Ian Conley presents for a telehealth virtual visit for medication refills for lorazepam.  Ian Conley has no other concerns or questions.     Current Medication: Outpatient Encounter Medications as of 01/28/2023  Medication Sig   amLODipine (NORVASC) 5 MG tablet TAKE ONE TABLET BY MOUTH ONCE DAILY   atorvastatin (LIPITOR) 80 MG tablet TAKE 1 TABLET(80 MG) BY MOUTH DAILY AT 6 PM   CLARITIN 10 MG CAPS Take by mouth daily.   dipyridamole-aspirin (AGGRENOX) 200-25 MG 12hr capsule Take 1 capsule by mouth 2 (two) times daily.   donepezil (ARICEPT) 10 MG tablet Take 1 tablet (10 mg total) by mouth daily.   memantine (NAMENDA) 10 MG tablet Take 1 tablet (10 mg total) by mouth 2 (two) times daily.   mirtazapine (REMERON) 7.5 MG tablet TAKE 1 TABLET(7.5 MG) BY MOUTH AT BEDTIME   Multiple Vitamins-Minerals (YOUR LIFE MULTI MENS 50+) TABS Take by mouth.   polyethylene glycol powder (GLYCOLAX/MIRALAX) powder as needed.    [DISCONTINUED] LORazepam (ATIVAN) 0.5 MG tablet TAKE ONE TABLET BY MOUTH EVERY MORNING and TAKE 1/2 TABLET BY MOUTH EVERYDAY AT BEDTIME   LORazepam (ATIVAN) 0.5 MG tablet TAKE ONE TABLET BY MOUTH EVERY MORNING and TAKE 1/2 TABLET BY MOUTH  EVERYDAY AT BEDTIME   No facility-administered encounter medications on file as of 01/28/2023.    Surgical History: History reviewed. No pertinent surgical history.  Medical History: Past Medical History:  Diagnosis Date   Allergy    Anemia    Hypertension    Pancreatitis    Personal history of tobacco use, presenting hazards to health 03/25/2016   Polycythemia    Stroke [facility])    2008 9/11    Family History: Family History  Problem Relation Age of Onset   Diabetes Mother    Heart failure Mother    Cancer Father        Throat    Stroke Father    Hypertension Father    HIV Brother    Thyroid disease Sister        thyroid cancer   Hypertension Sister        both sisters    Social History   Socioeconomic History   Marital status: Widowed    Spouse name: Not on file   Number of children: Not on file   Years of education: Not on file   Highest education level: Not on file  Occupational History   Not on file  Tobacco Use   Smoking status: Every Day    Packs/day: 1.00    Years: 45.00    Total pack years: 45.00    Types: Cigarettes   Smokeless tobacco: Never   Tobacco comments:    1 pack per week  Vaping Use  Vaping Use: Never used  Substance and Sexual Activity   Alcohol use: Not Currently    Comment: when pt was at home, no alcohol since July, 2019   Drug use: No   Sexual activity: Not on file  Other Topics Concern   Not on file  Social History Narrative   Not on file   Social Determinants of Health   Financial Resource Strain: Not on file  Food Insecurity: Not on file  Transportation Needs: Not on file  Physical Activity: Not on file  Stress: Not on file  Social Connections: Not on file  Intimate Partner Violence: Not on file      Review of Systems  Constitutional:  Negative for chills, fatigue and unexpected weight change.  HENT:  Negative for congestion, rhinorrhea, sneezing and sore throat.   Eyes:  Negative for redness.  Respiratory:   Negative for cough, chest tightness and shortness of breath.   Cardiovascular:  Negative for chest pain and palpitations.  Gastrointestinal:  Negative for abdominal pain, constipation, diarrhea, nausea and vomiting.  Genitourinary:  Negative for dysuria and frequency.  Musculoskeletal:  Negative for arthralgias, back pain, joint swelling and neck pain.  Skin:  Negative for rash.  Neurological: Negative.  Negative for tremors and numbness.  Hematological:  Negative for adenopathy. Does not bruise/bleed easily.  Psychiatric/Behavioral:  Positive for sleep disturbance. Negative for behavioral problems (Depression), self-injury and suicidal ideas. The patient is nervous/anxious.     Vital Signs: Resp 16   Ht 5' 11"$  (1.803 m)   Wt 140 lb (63.5 kg)   BMI 19.53 kg/m    Observation/Objective: Ian Conley is alert and oriented and engages in conversation appropriately. No acute distress noted.     Assessment/Plan: 1. Dementia, multiinfarct, with behavioral disturbance ([place]) Refills ordered x3 months, follow up in 3 months for additional refills.  - LORazepam (ATIVAN) 0.5 MG tablet; TAKE ONE TABLET BY MOUTH EVERY MORNING and TAKE 1/2 TABLET BY MOUTH EVERYDAY AT BEDTIME  Dispense: 135 tablet; Refill: 1   General Counseling: Ian Conley verbalizes understanding of the findings of today's phone visit and agrees with plan of treatment. I have discussed any further diagnostic evaluation that may be needed or ordered today. We also reviewed his medications today. Ian Conley has been encouraged to call the office with any questions or concerns that should arise related to todays visit.  Return in about 3 months (around 04/21/2023) for F/U, anxiety med refill, Rachele Lamaster PCP.   No orders of the defined types were placed in this encounter.   Meds ordered this encounter  Medications   LORazepam (ATIVAN) 0.5 MG tablet    Sig: TAKE ONE TABLET BY MOUTH EVERY MORNING and TAKE 1/2 TABLET BY MOUTH EVERYDAY AT BEDTIME     Dispense:  135 tablet    Refill:  1    **patient needing 90DS script sent in, we only have 90 tabs left on rx and it goes in his packaging    Time spent:30 Minutes Time spent with patient included reviewing progress notes, labs, imaging studies, and discussing plan for follow up.  [place] Controlled Substance Database was reviewed by me for overdose risk score (ORS) if appropriate.  This patient was seen by Jonetta Osgood, FNP-C in collaboration with Dr. Clayborn Bigness as a part of collaborative care agreement.  Haeden Hudock R. Valetta Fuller, MSN, FNP-C Internal medicine

## 2023-01-31 ENCOUNTER — Encounter: Payer: Self-pay | Admitting: Nurse Practitioner

## 2023-03-31 DIAGNOSIS — R413 Other amnesia: Secondary | ICD-10-CM | POA: Diagnosis not present

## 2023-03-31 DIAGNOSIS — I679 Cerebrovascular disease, unspecified: Secondary | ICD-10-CM | POA: Diagnosis not present

## 2023-04-01 ENCOUNTER — Other Ambulatory Visit: Payer: Self-pay | Admitting: Nurse Practitioner

## 2023-04-01 DIAGNOSIS — I10 Essential (primary) hypertension: Secondary | ICD-10-CM

## 2023-04-28 ENCOUNTER — Ambulatory Visit: Payer: Medicare Other | Admitting: Nurse Practitioner

## 2023-07-07 ENCOUNTER — Other Ambulatory Visit: Payer: Self-pay | Admitting: Nurse Practitioner

## 2023-07-07 DIAGNOSIS — Z760 Encounter for issue of repeat prescription: Secondary | ICD-10-CM

## 2023-07-07 DIAGNOSIS — I10 Essential (primary) hypertension: Secondary | ICD-10-CM

## 2023-07-26 ENCOUNTER — Ambulatory Visit (INDEPENDENT_AMBULATORY_CARE_PROVIDER_SITE_OTHER): Payer: Medicare Other | Admitting: Podiatry

## 2023-07-26 DIAGNOSIS — Z91199 Patient's noncompliance with other medical treatment and regimen due to unspecified reason: Secondary | ICD-10-CM

## 2023-08-03 NOTE — Progress Notes (Signed)
1. No-show for appointment     

## 2023-08-17 ENCOUNTER — Other Ambulatory Visit: Payer: Self-pay | Admitting: Nurse Practitioner

## 2023-08-17 DIAGNOSIS — I10 Essential (primary) hypertension: Secondary | ICD-10-CM

## 2023-08-17 DIAGNOSIS — Z760 Encounter for issue of repeat prescription: Secondary | ICD-10-CM

## 2023-08-17 NOTE — Telephone Encounter (Signed)
Until Thursday

## 2023-08-17 NOTE — Telephone Encounter (Signed)
As per pt sister he had enough med until his appt on thursday

## 2023-08-17 NOTE — Telephone Encounter (Signed)
Pt sister said enough his appt

## 2023-08-19 ENCOUNTER — Encounter: Payer: Self-pay | Admitting: Nurse Practitioner

## 2023-08-19 ENCOUNTER — Telehealth (INDEPENDENT_AMBULATORY_CARE_PROVIDER_SITE_OTHER): Payer: Medicare Other | Admitting: Nurse Practitioner

## 2023-08-19 DIAGNOSIS — I10 Essential (primary) hypertension: Secondary | ICD-10-CM | POA: Diagnosis not present

## 2023-08-19 DIAGNOSIS — Z79899 Other long term (current) drug therapy: Secondary | ICD-10-CM

## 2023-08-19 DIAGNOSIS — F01518 Vascular dementia, unspecified severity, with other behavioral disturbance: Secondary | ICD-10-CM

## 2023-08-19 MED ORDER — POLYETHYLENE GLYCOL 3350 17 GM/SCOOP PO POWD
ORAL | 0 refills | Status: DC
Start: 2023-08-19 — End: 2024-04-26

## 2023-08-19 MED ORDER — DONEPEZIL HCL 10 MG PO TABS
10.0000 mg | ORAL_TABLET | Freq: Every day | ORAL | 1 refills | Status: DC
Start: 2023-08-19 — End: 2023-12-16

## 2023-08-19 MED ORDER — CLARITIN 10 MG PO CAPS
10.0000 mg | ORAL_CAPSULE | Freq: Every day | ORAL | 1 refills | Status: DC
Start: 2023-08-19 — End: 2023-10-11

## 2023-08-19 MED ORDER — ONE-A-DAY MENS PO TABS
1.0000 | ORAL_TABLET | Freq: Every day | ORAL | 1 refills | Status: AC
Start: 2023-08-19 — End: ?

## 2023-08-19 MED ORDER — LORAZEPAM 0.5 MG PO TABS
ORAL_TABLET | ORAL | 0 refills | Status: DC
Start: 2023-08-19 — End: 2023-10-13

## 2023-08-19 MED ORDER — MIRTAZAPINE 7.5 MG PO TABS
ORAL_TABLET | ORAL | 1 refills | Status: DC
Start: 2023-08-19 — End: 2024-01-14

## 2023-08-19 MED ORDER — ASPIRIN-DIPYRIDAMOLE ER 25-200 MG PO CP12: 1.0000 | ORAL_CAPSULE | Freq: Two times a day (BID) | ORAL | 1 refills | Status: DC

## 2023-08-19 MED ORDER — AMLODIPINE BESYLATE 5 MG PO TABS
5.0000 mg | ORAL_TABLET | Freq: Every day | ORAL | 1 refills | Status: DC
Start: 2023-08-19 — End: 2023-12-16

## 2023-08-19 MED ORDER — ATORVASTATIN CALCIUM 80 MG PO TABS
ORAL_TABLET | ORAL | 1 refills | Status: DC
Start: 2023-08-19 — End: 2023-12-16

## 2023-08-19 MED ORDER — MEMANTINE HCL 10 MG PO TABS
10.0000 mg | ORAL_TABLET | Freq: Two times a day (BID) | ORAL | 0 refills | Status: DC
Start: 2023-08-19 — End: 2023-10-06

## 2023-08-19 NOTE — Progress Notes (Signed)
[facility] [address] [place], [place] [address]  Internal MEDICINE  Telephone Visit  Patient Name: Ian Conley  [id number]  [id number]  Date of Service: 08/19/2023  I connected with the patient at 1655 by telephone and verified the patients identity using two identifiers.   I discussed the limitations, risks, security and privacy concerns of performing an evaluation and management service by telephone and the availability of in person appointments. I also discussed with the patient that there may be a patient responsible charge related to the service.  The patient expressed understanding and agrees to proceed.    Chief Complaint  Patient presents with   Telephone Screen    Follow up for med and home health.    Telephone Assessment    HPI Ian Conley presents for a telehealth virtual visit for routine follow up and medication refills  Patient has dementia and impaired cognitive function, in-person visits are not feasible in the patient's current medical condition.  [facility] and needs to [facility] to a new pharmacy that does pill packs and delivery.  Due for refills of lorazepam. Has an issue with his foot/toe but it is not bothering him now. He was reminded that a problem like that will need to be looked at in person so he will need to make an appt to come in the office if it is bothering him enough to have it looked at. Routine follow up visits and wellness visits can be virtual. He stated he will call the clinic to let me know if he needs his foot looked at.    Current Medication: Outpatient Encounter Medications as of 08/19/2023  Medication Sig   multivitamin (ONE-A-DAY MEN'S) TABS tablet Take 1 tablet by mouth daily.   [DISCONTINUED] amLODipine (NORVASC) 5 MG tablet TAKE ONE TABLET BY MOUTH ONCE DAILY   [DISCONTINUED] atorvastatin (LIPITOR) 80 MG tablet TAKE ONE TABLET BY MOUTH EVERYDAY AT BEDTIME   [DISCONTINUED] CLARITIN 10 MG CAPS  Take by mouth daily.   [DISCONTINUED] dipyridamole-aspirin (AGGRENOX) 200-25 MG 12hr capsule TAKE ONE CAPSULE BY MOUTH AT BREAKFAST AND AT BEDTIME   [DISCONTINUED] donepezil (ARICEPT) 10 MG tablet TAKE ONE TABLET BY MOUTH EVERYDAY AT BEDTIME   [DISCONTINUED] LORazepam (ATIVAN) 0.5 MG tablet TAKE ONE TABLET BY MOUTH EVERY MORNING and TAKE 1/2 TABLET BY MOUTH EVERYDAY AT BEDTIME   [DISCONTINUED] memantine (NAMENDA) 10 MG tablet TAKE ONE TABLET BY MOUTH AT BREAKFAST AND AT BEDTIME   [DISCONTINUED] mirtazapine (REMERON) 7.5 MG tablet TAKE ONE TABLET BY MOUTH EVERYDAY AT BEDTIME   [DISCONTINUED] Multiple Vitamins-Minerals (YOUR LIFE MULTI MENS 50+) TABS Take by mouth.   [DISCONTINUED] polyethylene glycol powder (GLYCOLAX/MIRALAX) powder as needed.    amLODipine (NORVASC) 5 MG tablet Take 1 tablet (5 mg total) by mouth daily.   atorvastatin (LIPITOR) 80 MG tablet TAKE ONE TABLET BY MOUTH EVERYDAY AT BEDTIME   CLARITIN 10 MG CAPS Take 1 capsule (10 mg total) by mouth daily.   dipyridamole-aspirin (AGGRENOX) 200-25 MG 12hr capsule Take 1 capsule by mouth 2 (two) times daily.   donepezil (ARICEPT) 10 MG tablet Take 1 tablet (10 mg total) by mouth at bedtime.   LORazepam (ATIVAN) 0.5 MG tablet TAKE ONE TABLET BY MOUTH EVERY MORNING and TAKE 1/2 TABLET BY MOUTH EVERYDAY AT BEDTIME   memantine (NAMENDA) 10 MG tablet Take 1 tablet (10 mg total) by mouth 2 (two) times daily.   mirtazapine (REMERON) 7.5 MG tablet TAKE ONE TABLET BY MOUTH EVERYDAY  AT BEDTIME   polyethylene glycol powder (GLYCOLAX/MIRALAX) 17 GM/SCOOP powder 1 scoop mixed in water or juice by mouth daily prn constipation   No facility-administered encounter medications on file as of 08/19/2023.    Surgical History: History reviewed. No pertinent surgical history.  Medical History: Past Medical History:  Diagnosis Date   Allergy    Anemia    Hypertension    Pancreatitis    Personal history of tobacco use, presenting hazards to health  03/25/2016   Polycythemia    Stroke [facility])    2008 9/11    Family History: Family History  Problem Relation Age of Onset   Diabetes Mother    Heart failure Mother    Cancer Father        Throat    Stroke Father    Hypertension Father    HIV Brother    Thyroid disease Sister        thyroid cancer   Hypertension Sister        both sisters    Social History   Socioeconomic History   Marital status: Widowed    Spouse name: Not on file   Number of children: Not on file   Years of education: Not on file   Highest education level: Not on file  Occupational History   Not on file  Tobacco Use   Smoking status: Every Day    Current packs/day: 1.00    Average packs/day: 1 pack/day for 45.0 years (45.0 ttl pk-yrs)    Types: Cigarettes   Smokeless tobacco: Never   Tobacco comments:    1 pack per week  Vaping Use   Vaping status: Never Used  Substance and Sexual Activity   Alcohol use: Not Currently    Comment: when pt was at home, no alcohol since July, 2019   Drug use: No   Sexual activity: Not on file  Other Topics Concern   Not on file  Social History Narrative   Not on file   Social Determinants of Health   Financial Resource Strain: Not on file  Food Insecurity: Not on file  Transportation Needs: Not on file  Physical Activity: Not on file  Stress: Not on file  Social Connections: Not on file  Intimate Partner Violence: Not on file      Review of Systems  Constitutional:  Negative for chills, fatigue and unexpected weight change.  HENT:  Negative for congestion, rhinorrhea, sneezing and sore throat.   Eyes:  Negative for redness.  Respiratory:  Negative for cough, chest tightness and shortness of breath.   Cardiovascular:  Negative for chest pain and palpitations.  Gastrointestinal:  Negative for abdominal pain, constipation, diarrhea, nausea and vomiting.  Genitourinary:  Negative for dysuria and frequency.  Musculoskeletal:  Negative for arthralgias,  back pain, joint swelling and neck pain.  Skin:  Negative for rash.  Neurological: Negative.  Negative for tremors and numbness.  Hematological:  Negative for adenopathy. Does not bruise/bleed easily.  Psychiatric/Behavioral:  Positive for sleep disturbance. Negative for behavioral problems (Depression), self-injury and suicidal ideas. The patient is nervous/anxious.     Vital Signs: Resp 16   Ht 5\' 9"  (1.753 m)   Wt 140 lb (63.5 kg)   BMI 20.67 kg/m    Observation/Objective: Patient is alert and at baseline, spoke some on the phone with his sister present as well. Voiced some of his own concerns but overall doing well.     Assessment/Plan: 1. Dementia, multiinfarct, with behavioral disturbance (HCC)  Continue ativan, donepezil, namenda and mirtazapine as prescribed. [facility].  - LORazepam (ATIVAN) 0.5 MG tablet; TAKE ONE TABLET BY MOUTH EVERY MORNING and TAKE 1/2 TABLET BY MOUTH EVERYDAY AT BEDTIME  Dispense: 135 tablet; Refill: 0 - donepezil (ARICEPT) 10 MG tablet; Take 1 tablet (10 mg total) by mouth at bedtime.  Dispense: 90 tablet; Refill: 1 - memantine (NAMENDA) 10 MG tablet; Take 1 tablet (10 mg total) by mouth 2 (two) times daily.  Dispense: 180 tablet; Refill: 0 - mirtazapine (REMERON) 7.5 MG tablet; TAKE ONE TABLET BY MOUTH EVERYDAY AT BEDTIME  Dispense: 90 tablet; Refill: 1  2. Essential hypertension Continue amlodipine as prescribed.  - amLODipine (NORVASC) 5 MG tablet; Take 1 tablet (5 mg total) by mouth daily.  Dispense: 90 tablet; Refill: 1  3. Encounter for medication review Medication list reviewed, updated and all medications were [facility] with requests for pill pack and delivery for the patient. His sister, Alessandra Grout was given the phone number to [facility] as well.  - atorvastatin (LIPITOR) 80 MG tablet; TAKE ONE TABLET BY MOUTH EVERYDAY AT BEDTIME  Dispense: 90 tablet; Refill: 1 -  dipyridamole-aspirin (AGGRENOX) 200-25 MG 12hr capsule; Take 1 capsule by mouth 2 (two) times daily.  Dispense: 180 capsule; Refill: 1 - CLARITIN 10 MG CAPS; Take 1 capsule (10 mg total) by mouth daily.  Dispense: 90 capsule; Refill: 1 - mirtazapine (REMERON) 7.5 MG tablet; TAKE ONE TABLET BY MOUTH EVERYDAY AT BEDTIME  Dispense: 90 tablet; Refill: 1 - polyethylene glycol powder (GLYCOLAX/MIRALAX) 17 GM/SCOOP powder; 1 scoop mixed in water or juice by mouth daily prn constipation  Dispense: 255 g; Refill: 0 - multivitamin (ONE-A-DAY MEN'S) TABS tablet; Take 1 tablet by mouth daily.  Dispense: 90 tablet; Refill: 1   General Counseling: Ian Conley verbalizes understanding of the findings of today's phone visit and agrees with plan of treatment. I have discussed any further diagnostic evaluation that may be needed or ordered today. We also reviewed his medications today. he has been encouraged to call the office with any questions or concerns that should arise related to todays visit.  Return in about 12 weeks (around 11/11/2023) for F/U, Maday Guarino PCP ativan refills -- virtual visit. .   No orders of the defined types were placed in this encounter.   Meds ordered this encounter  Medications   LORazepam (ATIVAN) 0.5 MG tablet    Sig: TAKE ONE TABLET BY MOUTH EVERY MORNING and TAKE 1/2 TABLET BY MOUTH EVERYDAY AT BEDTIME    Dispense:  135 tablet    Refill:  0    [facility], patient needs pill pack and delivery please, caregiver is sister Alessandra Grout phone number [phone]   amLODipine (NORVASC) 5 MG tablet    Sig: Take 1 tablet (5 mg total) by mouth daily.    Dispense:  90 tablet    Refill:  1    [facility], patient needs pill pack and delivery please, caregiver is sister Alessandra Grout phone number [phone]   atorvastatin (LIPITOR) 80 MG tablet    Sig: TAKE ONE TABLET BY MOUTH EVERYDAY AT BEDTIME     Dispense:  90 tablet    Refill:  1    [facility], patient needs pill pack and delivery please, caregiver is sister Alessandra Grout phone number [phone]   donepezil (ARICEPT) 10 MG  tablet    Sig: Take 1 tablet (10 mg total) by mouth at bedtime.    Dispense:  90 tablet    Refill:  1    [facility], patient needs pill pack and delivery please, caregiver is sister Alessandra Grout phone number [phone]   dipyridamole-aspirin (AGGRENOX) 200-25 MG 12hr capsule    Sig: Take 1 capsule by mouth 2 (two) times daily.    Dispense:  180 capsule    Refill:  1    [facility], patient needs pill pack and delivery please, caregiver is sister Alessandra Grout phone number [phone]   CLARITIN 10 MG CAPS    Sig: Take 1 capsule (10 mg total) by mouth daily.    Dispense:  90 capsule    Refill:  1    [facility], patient needs pill pack and delivery please, caregiver is sister Alessandra Grout phone number [phone]   memantine (NAMENDA) 10 MG tablet    Sig: Take 1 tablet (10 mg total) by mouth 2 (two) times daily.    Dispense:  180 tablet    Refill:  0    [facility], patient needs pill pack and delivery please, caregiver is sister Alessandra Grout phone number [phone]   mirtazapine (REMERON) 7.5 MG tablet    Sig: TAKE ONE TABLET BY MOUTH EVERYDAY AT BEDTIME    Dispense:  90 tablet    Refill:  1    [facility], patient needs pill pack and delivery please, caregiver is sister Alessandra Grout phone number [phone]   polyethylene glycol powder (GLYCOLAX/MIRALAX) 17 GM/SCOOP powder    Sig: 1 scoop mixed in water or juice by mouth daily prn constipation    Dispense:  255 g    Refill:  0    [facility], patient needs pill  pack and delivery please, caregiver is sister Alessandra Grout phone number [phone]   multivitamin (ONE-A-DAY MEN'S) TABS tablet    Sig: Take 1 tablet by mouth daily.    Dispense:  90 tablet    Refill:  1    [facility], patient needs pill pack and delivery please, caregiver is sister Alessandra Grout phone number [phone]    Time spent:20 Minutes Time spent with patient included reviewing progress notes, labs, imaging studies, and discussing plan for follow up.  [place] Controlled Substance Database was reviewed by me for overdose risk score (ORS) if appropriate.  This patient was seen by Sallyanne Kuster, FNP-C in collaboration with Dr. Beverely Risen as a part of collaborative care agreement.  Ahman Dugdale R. Tedd Sias, MSN, FNP-C Internal medicine

## 2023-08-20 ENCOUNTER — Other Ambulatory Visit: Payer: Self-pay | Admitting: Nurse Practitioner

## 2023-08-20 DIAGNOSIS — F01518 Vascular dementia, unspecified severity, with other behavioral disturbance: Secondary | ICD-10-CM

## 2023-08-20 DIAGNOSIS — Z79899 Other long term (current) drug therapy: Secondary | ICD-10-CM

## 2023-08-26 ENCOUNTER — Telehealth: Payer: Self-pay | Admitting: Nurse Practitioner

## 2023-08-26 NOTE — Telephone Encounter (Signed)
Lvm with sister to return my call to r/s 08/31/23 appointment to later in [facility]

## 2023-08-31 ENCOUNTER — Telehealth: Payer: Medicare Other | Admitting: Nurse Practitioner

## 2023-10-06 ENCOUNTER — Other Ambulatory Visit: Payer: Self-pay | Admitting: Nurse Practitioner

## 2023-10-06 DIAGNOSIS — F01518 Vascular dementia, unspecified severity, with other behavioral disturbance: Secondary | ICD-10-CM

## 2023-10-11 ENCOUNTER — Other Ambulatory Visit: Payer: Self-pay

## 2023-10-11 DIAGNOSIS — Z79899 Other long term (current) drug therapy: Secondary | ICD-10-CM

## 2023-10-11 MED ORDER — CLARITIN 10 MG PO CAPS
10.0000 mg | ORAL_CAPSULE | Freq: Every day | ORAL | 1 refills | Status: DC
Start: 2023-10-11 — End: 2024-04-26

## 2023-10-13 ENCOUNTER — Other Ambulatory Visit: Payer: Self-pay | Admitting: Nurse Practitioner

## 2023-10-13 DIAGNOSIS — F01518 Vascular dementia, unspecified severity, with other behavioral disturbance: Secondary | ICD-10-CM

## 2023-10-13 MED ORDER — ASPIRIN 81 MG PO TBEC: 81.0000 mg | DELAYED_RELEASE_TABLET | Freq: Every day | ORAL | 3 refills | Status: DC

## 2023-10-13 MED ORDER — LORAZEPAM 0.5 MG PO TABS
ORAL_TABLET | ORAL | 0 refills | Status: DC
Start: 2023-10-13 — End: 2023-10-14

## 2023-10-14 ENCOUNTER — Other Ambulatory Visit: Payer: Self-pay | Admitting: Nurse Practitioner

## 2023-10-14 DIAGNOSIS — Z79899 Other long term (current) drug therapy: Secondary | ICD-10-CM

## 2023-10-14 DIAGNOSIS — F01518 Vascular dementia, unspecified severity, with other behavioral disturbance: Secondary | ICD-10-CM

## 2023-10-14 MED ORDER — LORAZEPAM 0.5 MG PO TABS
ORAL_TABLET | ORAL | 0 refills | Status: DC
Start: 2023-10-14 — End: 2023-11-15

## 2023-10-14 MED ORDER — ASPIRIN-DIPYRIDAMOLE ER 25-200 MG PO CP12
1.0000 | ORAL_CAPSULE | Freq: Two times a day (BID) | ORAL | 0 refills | Status: DC
Start: 2023-10-14 — End: 2023-11-15

## 2023-10-15 ENCOUNTER — Other Ambulatory Visit: Payer: Self-pay | Admitting: Nurse Practitioner

## 2023-10-15 DIAGNOSIS — Z79899 Other long term (current) drug therapy: Secondary | ICD-10-CM

## 2023-10-15 NOTE — Telephone Encounter (Signed)
Please review and send 

## 2023-10-18 ENCOUNTER — Other Ambulatory Visit: Payer: Self-pay

## 2023-10-18 DIAGNOSIS — F01518 Vascular dementia, unspecified severity, with other behavioral disturbance: Secondary | ICD-10-CM

## 2023-11-15 ENCOUNTER — Encounter: Payer: Self-pay | Admitting: Nurse Practitioner

## 2023-11-15 ENCOUNTER — Telehealth (INDEPENDENT_AMBULATORY_CARE_PROVIDER_SITE_OTHER): Payer: Medicare Other | Admitting: Nurse Practitioner

## 2023-11-15 VITALS — Resp 16

## 2023-11-15 DIAGNOSIS — Z8673 Personal history of transient ischemic attack (TIA), and cerebral infarction without residual deficits: Secondary | ICD-10-CM | POA: Diagnosis not present

## 2023-11-15 DIAGNOSIS — Z79899 Other long term (current) drug therapy: Secondary | ICD-10-CM

## 2023-11-15 DIAGNOSIS — F01518 Vascular dementia, unspecified severity, with other behavioral disturbance: Secondary | ICD-10-CM | POA: Diagnosis not present

## 2023-11-15 DIAGNOSIS — Z0000 Encounter for general adult medical examination without abnormal findings: Secondary | ICD-10-CM

## 2023-11-15 DIAGNOSIS — F17210 Nicotine dependence, cigarettes, uncomplicated: Secondary | ICD-10-CM | POA: Diagnosis not present

## 2023-11-15 DIAGNOSIS — Z125 Encounter for screening for malignant neoplasm of prostate: Secondary | ICD-10-CM

## 2023-11-15 DIAGNOSIS — J439 Emphysema, unspecified: Secondary | ICD-10-CM

## 2023-11-15 DIAGNOSIS — E559 Vitamin D deficiency, unspecified: Secondary | ICD-10-CM

## 2023-11-15 DIAGNOSIS — E538 Deficiency of other specified B group vitamins: Secondary | ICD-10-CM

## 2023-11-15 DIAGNOSIS — R7989 Other specified abnormal findings of blood chemistry: Secondary | ICD-10-CM

## 2023-11-15 MED ORDER — ASPIRIN-DIPYRIDAMOLE ER 25-200 MG PO CP12
1.0000 | ORAL_CAPSULE | Freq: Two times a day (BID) | ORAL | 3 refills | Status: DC
Start: 2023-11-15 — End: 2023-11-22

## 2023-11-15 MED ORDER — LORAZEPAM 0.5 MG PO TABS
ORAL_TABLET | ORAL | 0 refills | Status: DC
Start: 2023-11-15 — End: 2024-04-26

## 2023-11-15 NOTE — Progress Notes (Signed)
[facility] [address] [place], [place] [address]  Internal MEDICINE  Telephone Visit  Patient Name: Ian Conley  [id number]  [id number]  Date of Service: 11/15/2023  I connected with the patient at 1645 by telephone and verified the patients identity using two identifiers.   I discussed the limitations, risks, security and privacy concerns of performing an evaluation and management service by telephone and the availability of in person appointments. I also discussed with the patient that there may be a patient responsible charge related to the service.  The patient expressed understanding and agrees to proceed.    Chief Complaint  Patient presents with   Telephone Screen    Ativan refills   Medicare Wellness    HPI [name] presents for a telehealth virtual visit for medicare wellness visit.  He is a 72 year old male with hypertension, COPD, and history of stroke.  He is due for medication refills and routine labs.  -son and sister help with medications.  his medications are prepackaged for each time that he needs to take them so it has been easier to ensure that he stays on top of his medications.  His sister helps him with managing his finances and keeping his home tidy.  -She reports he takes lorazepam 0.5mg  tab 1 in Am and 1/2 tab at night to help with his behavioral changes related to dementia and this seems to work well.       11/15/2023    4:52 PM  Depression screen PHQ 2/9  Decreased Interest 0  Down, Depressed, Hopeless 0  PHQ - 2 Score 0        02/12/2021    1:33 PM 08/28/2021   11:45 AM 01/21/2022    2:13 PM 06/24/2022   11:36 AM 11/15/2023    5:01 PM  Fall Risk  Falls in the past year?  0 0 0 0  Was there an injury with Fall?     0  Fall Risk Category Calculator     0  (RETIRED) Patient Fall Risk Level High fall risk Low fall risk Low fall risk    Patient at Risk for Falls Due to  No Fall Risks No Fall Risks  Mental status change;Impaired  balance/gait;Impaired mobility;Medication side effect  Fall risk Follow up  Falls evaluation completed Falls evaluation completed       Functional Status Survey: Is the patient deaf or have difficulty hearing?: No Does the patient have difficulty seeing, even when wearing glasses/contacts?: No Does the patient have difficulty concentrating, remembering, or making decisions?: No Does the patient have difficulty walking or climbing stairs?: No Does the patient have difficulty dressing or bathing?: No Does the patient have difficulty doing errands alone such as visiting a doctor's office or shopping?: Yes         11/15/2023    4:51 PM 01/21/2022    2:13 PM 10/28/2020    1:56 PM  MMSE - Mini Mental State Exam  Not completed: Unable to complete --   Orientation to time  2 0  Orientation to Place  5 0  Registration  3 3  Attention/ Calculation  0 5  Recall  3 0  Language- name 2 objects  2 2  Language- repeat  1 0  Language- follow 3 step command  3 0  Language- read & follow direction  1 1  Write a sentence  1 1  Copy design  1 1  Total score  22 13  Current Medication: Outpatient Encounter Medications as of 11/15/2023  Medication Sig   CLARITIN 10 MG CAPS Take 1 capsule (10 mg total) by mouth daily.   LORazepam (ATIVAN) 0.5 MG tablet TAKE ONE TABLET BY MOUTH EVERY MORNING and TAKE 1/2 TABLET BY MOUTH EVERYDAY AT BEDTIME   multivitamin (ONE-A-DAY MEN'S) TABS tablet Take 1 tablet by mouth daily.   polyethylene glycol powder (GLYCOLAX/MIRALAX) 17 GM/SCOOP powder 1 scoop mixed in water or juice by mouth daily prn constipation   [DISCONTINUED] amLODipine (NORVASC) 5 MG tablet Take 1 tablet (5 mg total) by mouth daily.   [DISCONTINUED] atorvastatin (LIPITOR) 80 MG tablet TAKE ONE TABLET BY MOUTH EVERYDAY AT BEDTIME   [DISCONTINUED] dipyridamole-aspirin (AGGRENOX) 200-25 MG 12hr capsule Take 1 capsule by mouth 2 (two) times daily.   [DISCONTINUED] dipyridamole-aspirin  (AGGRENOX) 200-25 MG 12hr capsule Take 1 capsule by mouth 2 (two) times daily.   [DISCONTINUED] donepezil (ARICEPT) 10 MG tablet Take 1 tablet (10 mg total) by mouth at bedtime.   [DISCONTINUED] LORazepam (ATIVAN) 0.5 MG tablet TAKE ONE TABLET BY MOUTH EVERY MORNING and TAKE 1/2 TABLET BY MOUTH EVERYDAY AT BEDTIME   [DISCONTINUED] memantine (NAMENDA) 10 MG tablet TAKE 1 TABLET BY MOUTH TWICE DAILY   [DISCONTINUED] mirtazapine (REMERON) 7.5 MG tablet TAKE ONE TABLET BY MOUTH EVERYDAY AT BEDTIME   No facility-administered encounter medications on file as of 11/15/2023.    Surgical History: History reviewed. No pertinent surgical history.  Medical History: Past Medical History:  Diagnosis Date   Allergy    Anemia    Hypertension    Pancreatitis    Personal history of tobacco use, presenting hazards to health 03/25/2016   Polycythemia    Stroke [facility])    2008 9/11    Family History: Family History  Problem Relation Age of Onset   Diabetes Mother    Heart failure Mother    Cancer Father        Throat    Stroke Father    Hypertension Father    HIV Brother    Thyroid disease Sister        thyroid cancer   Hypertension Sister        both sisters    Social History   Socioeconomic History   Marital status: Widowed    Spouse name: Not on file   Number of children: Not on file   Years of education: Not on file   Highest education level: Not on file  Occupational History   Not on file  Tobacco Use   Smoking status: Every Day    Current packs/day: 1.00    Average packs/day: 1 pack/day for 45.0 years (45.0 ttl pk-yrs)    Types: Cigarettes   Smokeless tobacco: Never   Tobacco comments:    1 pack per week  Vaping Use   Vaping status: Never Used  Substance and Sexual Activity   Alcohol use: Not Currently    Comment: when pt was at home, no alcohol since July, 2019   Drug use: No   Sexual activity: Not Currently  Other Topics Concern   Not on file  Social History  Narrative   Not on file   Social Drivers of Health   Financial Resource Strain: Low Risk  (11/15/2023)   Overall Financial Resource Strain (CARDIA)    Difficulty of Paying Living Expenses: Not very hard  Food Insecurity: No Food Insecurity (11/15/2023)   Hunger Vital Sign    Worried About Running Out of Food in the  Last Year: Never true    Ran Out of Food in the Last Year: Never true  Transportation Needs: Unmet Transportation Needs (11/15/2023)   PRAPARE - Administrator, Civil Service (Medical): Yes    Lack of Transportation (Non-Medical): No  Physical Activity: Inactive (11/15/2023)   Exercise Vital Sign    Days of Exercise per Week: 0 days    Minutes of Exercise per Session: 0 min  Stress: Stress Concern Present (11/15/2023)   [employer] of Occupational Health - Occupational Stress Questionnaire    Feeling of Stress : To some extent  Social Connections: Socially Isolated (11/15/2023)   Social Connection and Isolation Panel [NHANES]    Frequency of Communication with Friends and Family: Once a week    Frequency of Social Gatherings with Friends and Family: Once a week    Attends Religious Services: Never    Database administrator or Organizations: No    Attends Banker Meetings: Never    Marital Status: Widowed  Intimate Partner Violence: Not At Risk (11/15/2023)   Humiliation, Afraid, Rape, and Kick questionnaire    Fear of Current or Ex-Partner: No    Emotionally Abused: No    Physically Abused: No    Sexually Abused: No      Review of Systems  Constitutional:  Negative for activity change, appetite change, chills, fatigue, fever and unexpected weight change.  HENT: Negative.  Negative for congestion, ear pain, rhinorrhea, sore throat and trouble swallowing.   Eyes: Negative.   Respiratory: Negative.  Negative for cough, chest tightness, shortness of breath and wheezing.   Cardiovascular: Negative.  Negative for chest pain and  palpitations.  Gastrointestinal: Negative.  Negative for abdominal pain, blood in stool, constipation, diarrhea, nausea and vomiting.  Endocrine: Negative.   Genitourinary: Negative.  Negative for difficulty urinating, dysuria, frequency, hematuria and urgency.  Musculoskeletal: Negative.  Negative for arthralgias, back pain, joint swelling, myalgias and neck pain.  Skin: Negative.  Negative for rash and wound.  Allergic/Immunologic: Negative.  Negative for immunocompromised state.  Neurological: Negative.  Negative for dizziness, seizures, numbness and headaches.  Hematological: Negative.   Psychiatric/Behavioral:  Positive for agitation and confusion (intermittent, patient has dementia). Negative for behavioral problems, self-injury and suicidal ideas. The patient is not nervous/anxious.     Vital Signs: Resp 16    Observation/Objective: He is alert and at his baseline neurologically. No acute distress noted.     Assessment/Plan: 1. Encounter for subsequent annual wellness visit (AWV) in Medicare patient (Primary) Age-appropriate preventive screenings and vaccinations discussed. Routine labs for health maintenance deferred for now. PHM updated.    2. Chronic pulmonary emphysema not affecting current episode of care [facility]) Not currently on any maintenance inhalers, denies any SOB or issues at  this time. Continue to monitor periodically.   3. History of stroke Noted history, which may correlate with his dementia. Stable and at baseline.   4. Dementia, multiinfarct, with behavioral disturbance (HCC) Continue lorazepam as prescribed, follow up in 3 months for additional refills.  - LORazepam (ATIVAN) 0.5 MG tablet; TAKE ONE TABLET BY MOUTH EVERY MORNING and TAKE 1/2 TABLET BY MOUTH EVERYDAY AT BEDTIME  Dispense: 135 tablet; Refill: 0  5. Moderate smoker (20 or less per day) Continues to smoke, not interested in quitting.    General Counseling: Demorio verbalizes understanding of  the findings of today's phone visit and agrees with plan of treatment. I have discussed any further diagnostic evaluation that may be needed  or ordered today. We also reviewed his medications today. he has been encouraged to call the office with any questions or concerns that should arise related to todays visit.  Return in about 3 months (around 02/09/2024) for F/U, Nazariah Cadet PCP, anxiety med refill, may be virtual if unable to come into the office..   No orders of the defined types were placed in this encounter.   Meds ordered this encounter  Medications   DISCONTD: dipyridamole-aspirin (AGGRENOX) 200-25 MG 12hr capsule    Sig: Take 1 capsule by mouth 2 (two) times daily.    Dispense:  180 capsule    Refill:  3    For future refills   LORazepam (ATIVAN) 0.5 MG tablet    Sig: TAKE ONE TABLET BY MOUTH EVERY MORNING and TAKE 1/2 TABLET BY MOUTH EVERYDAY AT BEDTIME    Dispense:  135 tablet    Refill:  0    3 month refill    Time spent:30 Minutes Time spent with patient included reviewing progress notes, labs, imaging studies, and discussing plan for follow up.  [place] Controlled Substance Database was reviewed by me for overdose risk score (ORS) if appropriate.  This patient was seen by Sallyanne Kuster, FNP-C in collaboration with Dr. Beverely Risen as a part of collaborative care agreement.  Shekita Boyden R. Tedd Sias, MSN, FNP-C Internal medicine

## 2023-11-22 ENCOUNTER — Other Ambulatory Visit: Payer: Self-pay

## 2023-11-22 DIAGNOSIS — Z79899 Other long term (current) drug therapy: Secondary | ICD-10-CM

## 2023-11-22 MED ORDER — ASPIRIN-DIPYRIDAMOLE ER 25-200 MG PO CP12: 1.0000 | ORAL_CAPSULE | Freq: Two times a day (BID) | ORAL | 3 refills | Status: DC

## 2023-12-09 ENCOUNTER — Other Ambulatory Visit: Payer: Self-pay | Admitting: Nurse Practitioner

## 2023-12-09 DIAGNOSIS — F01518 Vascular dementia, unspecified severity, with other behavioral disturbance: Secondary | ICD-10-CM

## 2023-12-16 ENCOUNTER — Other Ambulatory Visit: Payer: Self-pay | Admitting: Nurse Practitioner

## 2023-12-16 DIAGNOSIS — F01518 Vascular dementia, unspecified severity, with other behavioral disturbance: Secondary | ICD-10-CM

## 2023-12-16 DIAGNOSIS — I10 Essential (primary) hypertension: Secondary | ICD-10-CM

## 2023-12-16 DIAGNOSIS — Z79899 Other long term (current) drug therapy: Secondary | ICD-10-CM

## 2024-01-14 ENCOUNTER — Other Ambulatory Visit: Payer: Self-pay

## 2024-01-14 DIAGNOSIS — F01518 Vascular dementia, unspecified severity, with other behavioral disturbance: Secondary | ICD-10-CM

## 2024-01-14 DIAGNOSIS — I10 Essential (primary) hypertension: Secondary | ICD-10-CM

## 2024-01-14 DIAGNOSIS — Z79899 Other long term (current) drug therapy: Secondary | ICD-10-CM

## 2024-01-14 MED ORDER — MEMANTINE HCL 10 MG PO TABS: 10.0000 mg | ORAL_TABLET | Freq: Two times a day (BID) | ORAL | 1 refills | Status: DC

## 2024-01-14 MED ORDER — MIRTAZAPINE 7.5 MG PO TABS: ORAL_TABLET | ORAL | 1 refills | Status: DC

## 2024-01-14 MED ORDER — AMLODIPINE BESYLATE 5 MG PO TABS: 5.0000 mg | ORAL_TABLET | Freq: Every day | ORAL | 2 refills | Status: DC

## 2024-01-14 MED ORDER — DONEPEZIL HCL 10 MG PO TABS: 10.0000 mg | ORAL_TABLET | Freq: Every day | ORAL | 2 refills | Status: DC

## 2024-01-14 MED ORDER — ATORVASTATIN CALCIUM 80 MG PO TABS: 80.0000 mg | ORAL_TABLET | Freq: Every day | ORAL | 2 refills | Status: DC

## 2024-01-23 ENCOUNTER — Encounter: Payer: Self-pay | Admitting: Nurse Practitioner

## 2024-02-09 ENCOUNTER — Other Ambulatory Visit: Payer: Self-pay | Admitting: Internal Medicine

## 2024-02-09 DIAGNOSIS — F01518 Vascular dementia, unspecified severity, with other behavioral disturbance: Secondary | ICD-10-CM

## 2024-02-09 DIAGNOSIS — Z79899 Other long term (current) drug therapy: Secondary | ICD-10-CM

## 2024-03-28 ENCOUNTER — Encounter: Payer: Self-pay | Admitting: *Deleted

## 2024-03-30 DIAGNOSIS — R413 Other amnesia: Secondary | ICD-10-CM | POA: Diagnosis not present

## 2024-03-30 DIAGNOSIS — I679 Cerebrovascular disease, unspecified: Secondary | ICD-10-CM | POA: Diagnosis not present

## 2024-04-21 ENCOUNTER — Other Ambulatory Visit: Payer: Self-pay | Admitting: Nurse Practitioner

## 2024-04-21 DIAGNOSIS — F01518 Vascular dementia, unspecified severity, with other behavioral disturbance: Secondary | ICD-10-CM

## 2024-04-21 NOTE — Telephone Encounter (Signed)
 Please review

## 2024-04-26 ENCOUNTER — Telehealth (INDEPENDENT_AMBULATORY_CARE_PROVIDER_SITE_OTHER): Admitting: Nurse Practitioner

## 2024-04-26 ENCOUNTER — Encounter: Payer: Self-pay | Admitting: Nurse Practitioner

## 2024-04-26 VITALS — Resp 16 | Ht 69.0 in | Wt 140.0 lb

## 2024-04-26 DIAGNOSIS — Z8673 Personal history of transient ischemic attack (TIA), and cerebral infarction without residual deficits: Secondary | ICD-10-CM | POA: Diagnosis not present

## 2024-04-26 DIAGNOSIS — Z79899 Other long term (current) drug therapy: Secondary | ICD-10-CM | POA: Diagnosis not present

## 2024-04-26 DIAGNOSIS — I10 Essential (primary) hypertension: Secondary | ICD-10-CM

## 2024-04-26 DIAGNOSIS — F01518 Vascular dementia, unspecified severity, with other behavioral disturbance: Secondary | ICD-10-CM

## 2024-04-26 MED ORDER — LORAZEPAM 0.5 MG PO TABS: ORAL_TABLET | ORAL | 0 refills | Status: DC

## 2024-04-26 MED ORDER — MIRTAZAPINE 7.5 MG PO TABS: ORAL_TABLET | ORAL | 1 refills | Status: DC

## 2024-04-26 MED ORDER — ATORVASTATIN CALCIUM 80 MG PO TABS: 80.0000 mg | ORAL_TABLET | Freq: Every day | ORAL | 2 refills | Status: AC

## 2024-04-26 MED ORDER — AMLODIPINE BESYLATE 5 MG PO TABS: 5.0000 mg | ORAL_TABLET | Freq: Every day | ORAL | 2 refills | Status: AC

## 2024-04-26 MED ORDER — CLARITIN 10 MG PO CAPS
10.0000 mg | ORAL_CAPSULE | Freq: Every day | ORAL | 1 refills | Status: AC
Start: 2024-04-26 — End: ?

## 2024-04-26 MED ORDER — DONEPEZIL HCL 10 MG PO TABS
10.0000 mg | ORAL_TABLET | Freq: Every day | ORAL | 2 refills | Status: AC
Start: 2024-04-26 — End: ?

## 2024-04-26 MED ORDER — POLYETHYLENE GLYCOL 3350 17 GM/SCOOP PO POWD
ORAL | 0 refills | Status: AC
Start: 2024-04-26 — End: ?

## 2024-04-26 NOTE — Progress Notes (Signed)
 [facility] [address] [place], [place] [address]  Internal MEDICINE  Telephone Visit  Patient Name: Ian Conley  [id number]  [id number]  Date of Service: 04/26/2024  I connected with the patient at 1205 by telephone and verified the patients identity using two identifiers.   I discussed the limitations, risks, security and privacy concerns of performing an evaluation and management service by telephone and the availability of in person appointments. I also discussed with the patient that there may be a patient responsible charge related to the service.  The patient expressed understanding and agrees to proceed.    Chief Complaint  Patient presents with   Telephone Screen    Medication refills    Telephone Assessment    HPI Ian Conley presents for a telehealth virtual visit for medication refills and continuity of care. His caregiver is his sister [name] Age who is present for the telehealth visit today.  Takes lorazepam  for anxiety and agitation related to his dementia.  Hypertension -- no recent BP available due to patient having difficulty coming in person for office visit due to his worsening dementia. Currently taking amlodipine  Taking donepezil  and memantine  for his dementia.  For stroke prevention -- he is on aggrenox  and atorvastatin .   Current Medication: Outpatient Encounter Medications as of 04/26/2024  Medication Sig   dipyridamole -aspirin  (AGGRENOX ) 200-25 MG 12hr capsule Take 1 capsule by mouth 2 (two) times daily.   memantine  (NAMENDA ) 10 MG tablet TAKE 1 TABLET(10 MG) BY MOUTH TWICE DAILY   multivitamin (ONE-A-DAY MEN'S) TABS tablet Take 1 tablet by mouth daily.   [DISCONTINUED] amLODipine  (NORVASC ) 5 MG tablet Take 1 tablet (5 mg total) by mouth daily.   [DISCONTINUED] atorvastatin  (LIPITOR) 80 MG tablet Take 1 tablet (80 mg total) by mouth at bedtime.   [DISCONTINUED] CLARITIN  10 MG CAPS Take 1 capsule (10 mg total) by mouth daily.   [DISCONTINUED]  donepezil  (ARICEPT ) 10 MG tablet Take 1 tablet (10 mg total) by mouth at bedtime.   [DISCONTINUED] LORazepam  (ATIVAN ) 0.5 MG tablet TAKE ONE TABLET BY MOUTH EVERY MORNING and TAKE 1/2 TABLET BY MOUTH EVERYDAY AT BEDTIME   [DISCONTINUED] mirtazapine  (REMERON ) 7.5 MG tablet TAKE ONE TABLET BY MOUTH EVERYDAY AT BEDTIME   [DISCONTINUED] polyethylene glycol powder (GLYCOLAX /MIRALAX ) 17 GM/SCOOP powder 1 scoop mixed in water or juice by mouth daily prn constipation   amLODipine  (NORVASC ) 5 MG tablet Take 1 tablet (5 mg total) by mouth daily.   atorvastatin  (LIPITOR) 80 MG tablet Take 1 tablet (80 mg total) by mouth at bedtime.   CLARITIN  10 MG CAPS Take 1 capsule (10 mg total) by mouth daily.   donepezil  (ARICEPT ) 10 MG tablet Take 1 tablet (10 mg total) by mouth at bedtime.   LORazepam  (ATIVAN ) 0.5 MG tablet TAKE ONE TABLET BY MOUTH EVERY MORNING and TAKE 1/2 TABLET BY MOUTH EVERYDAY AT BEDTIME   mirtazapine  (REMERON ) 7.5 MG tablet TAKE ONE TABLET BY MOUTH EVERYDAY AT BEDTIME   polyethylene glycol powder (GLYCOLAX /MIRALAX ) 17 GM/SCOOP powder 1 scoop mixed in water or juice by mouth daily prn constipation   No facility-administered encounter medications on file as of 04/26/2024.    Surgical History: History reviewed. No pertinent surgical history.  Medical History: Past Medical History:  Diagnosis Date   Allergy    Anemia    Hypertension    Pancreatitis    Personal history of tobacco use, presenting hazards to health 03/25/2016   Polycythemia    Stroke [facility])    2008 9/11  Family History: Family History  Problem Relation Age of Onset   Diabetes Mother    Heart failure Mother    Cancer Father        Throat    Stroke Father    Hypertension Father    HIV Brother    Thyroid  disease Sister        thyroid  cancer   Hypertension Sister        both sisters    Social History   Socioeconomic History   Marital status: Widowed    Spouse name: Not on file   Number of children: Not on  file   Years of education: Not on file   Highest education level: Not on file  Occupational History   Not on file  Tobacco Use   Smoking status: Every Day    Current packs/day: 1.00    Average packs/day: 1 pack/day for 45.0 years (45.0 ttl pk-yrs)    Types: Cigarettes   Smokeless tobacco: Never   Tobacco comments:    1 pack per week  Vaping Use   Vaping status: Never Used  Substance and Sexual Activity   Alcohol use: Not Currently    Comment: when pt was at home, no alcohol since July, 2019   Drug use: No   Sexual activity: Not Currently  Other Topics Concern   Not on file  Social History Narrative   Not on file   Social Drivers of Health   Financial Resource Strain: Low Risk  (11/15/2023)   Overall Financial Resource Strain (CARDIA)    Difficulty of Paying Living Expenses: Not very hard  Food Insecurity: No Food Insecurity (11/15/2023)   Hunger Vital Sign    Worried About Running Out of Food in the Last Year: Never true    Ran Out of Food in the Last Year: Never true  Transportation Needs: Unmet Transportation Needs (11/15/2023)   PRAPARE - Administrator, Civil Service (Medical): Yes    Lack of Transportation (Non-Medical): No  Physical Activity: Inactive (11/15/2023)   Exercise Vital Sign    Days of Exercise per Week: 0 days    Minutes of Exercise per Session: 0 min  Stress: Stress Concern Present (11/15/2023)   [employer] of Occupational Health - Occupational Stress Questionnaire    Feeling of Stress : To some extent  Social Connections: Socially Isolated (11/15/2023)   Social Connection and Isolation Panel [NHANES]    Frequency of Communication with Friends and Family: Once a week    Frequency of Social Gatherings with Friends and Family: Once a week    Attends Religious Services: Never    Database administrator or Organizations: No    Attends Banker Meetings: Never    Marital Status: Widowed  Intimate Partner Violence: Not At  Risk (11/15/2023)   Humiliation, Afraid, Rape, and Kick questionnaire    Fear of Current or Ex-Partner: No    Emotionally Abused: No    Physically Abused: No    Sexually Abused: No      Review of Systems  Constitutional:  Negative for chills, fatigue and unexpected weight change.  HENT:  Negative for congestion, rhinorrhea, sneezing and sore throat.   Eyes:  Negative for redness.  Respiratory: Negative.  Negative for cough, chest tightness, shortness of breath and wheezing.   Cardiovascular: Negative.  Negative for chest pain and palpitations.  Gastrointestinal:  Negative for abdominal pain, constipation, diarrhea, nausea and vomiting.  Genitourinary:  Negative for dysuria and frequency.  Musculoskeletal: Negative.  Negative for arthralgias, back pain, joint swelling and neck pain.  Skin:  Negative for rash.  Neurological: Negative.  Negative for tremors and numbness.  Hematological:  Negative for adenopathy. Does not bruise/bleed easily.  Psychiatric/Behavioral:  Positive for agitation, confusion and sleep disturbance. Negative for behavioral problems (Depression), self-injury and suicidal ideas. The patient is nervous/anxious.     Vital Signs: Resp 16   Ht 5\' 9"  (1.753 m)   Wt 140 lb (63.5 kg)   BMI 20.67 kg/m    Observation/Objective: He is at his baseline. No acute distress noted.     Assessment/Plan: 1. Essential hypertension (Primary) Continue amlodipine  as prescribed.  - amLODipine  (NORVASC ) 5 MG tablet; Take 1 tablet (5 mg total) by mouth daily.  Dispense: 100 tablet; Refill: 2  2. Dementia, multiinfarct, with behavioral disturbance (HCC) Continue memantine , donepezil  and mirtazapine  as prescribed. Continue prn lorazepam  as prescribed.  - LORazepam  (ATIVAN ) 0.5 MG tablet; TAKE ONE TABLET BY MOUTH EVERY MORNING and TAKE 1/2 TABLET BY MOUTH EVERYDAY AT BEDTIME  Dispense: 135 tablet; Refill: 0 - donepezil  (ARICEPT ) 10 MG tablet; Take 1 tablet (10 mg total) by mouth  at bedtime.  Dispense: 100 tablet; Refill: 2 - mirtazapine  (REMERON ) 7.5 MG tablet; TAKE ONE TABLET BY MOUTH EVERYDAY AT BEDTIME  Dispense: 90 tablet; Refill: 1  3. Encounter for medication review Medication list reviewed, updated and refills ordered.  - atorvastatin  (LIPITOR) 80 MG tablet; Take 1 tablet (80 mg total) by mouth at bedtime.  Dispense: 100 tablet; Refill: 2 - mirtazapine  (REMERON ) 7.5 MG tablet; TAKE ONE TABLET BY MOUTH EVERYDAY AT BEDTIME  Dispense: 90 tablet; Refill: 1 - polyethylene glycol powder (GLYCOLAX /MIRALAX ) 17 GM/SCOOP powder; 1 scoop mixed in water or juice by mouth daily prn constipation  Dispense: 255 g; Refill: 0 - CLARITIN  10 MG CAPS; Take 1 capsule (10 mg total) by mouth daily.  Dispense: 90 capsule; Refill: 1  4. History of stroke Continue aggrenox  and atorvastatin  as prescribed.    General Counseling: Aerion verbalizes understanding of the findings of today's phone visit and agrees with plan of treatment. I have discussed any further diagnostic evaluation that may be needed or ordered today. We also reviewed his medications today. he has been encouraged to call the office with any questions or concerns that should arise related to todays visit.  Return in about 12 weeks (around 07/19/2024) for F/U, Naveh Rickles PCP, anxiety med refill lorazepam  may be virtual. .   No orders of the defined types were placed in this encounter.   Meds ordered this encounter  Medications   amLODipine  (NORVASC ) 5 MG tablet    Sig: Take 1 tablet (5 mg total) by mouth daily.    Dispense:  100 tablet    Refill:  2    For future refills   atorvastatin  (LIPITOR) 80 MG tablet    Sig: Take 1 tablet (80 mg total) by mouth at bedtime.    Dispense:  100 tablet    Refill:  2   LORazepam  (ATIVAN ) 0.5 MG tablet    Sig: TAKE ONE TABLET BY MOUTH EVERY MORNING and TAKE 1/2 TABLET BY MOUTH EVERYDAY AT BEDTIME    Dispense:  135 tablet    Refill:  0    3 month refill   donepezil  (ARICEPT ) 10  MG tablet    Sig: Take 1 tablet (10 mg total) by mouth at bedtime.    Dispense:  100 tablet    Refill:  2  For future refills   mirtazapine  (REMERON ) 7.5 MG tablet    Sig: TAKE ONE TABLET BY MOUTH EVERYDAY AT BEDTIME    Dispense:  90 tablet    Refill:  1    For future refills   polyethylene glycol powder (GLYCOLAX /MIRALAX ) 17 GM/SCOOP powder    Sig: 1 scoop mixed in water or juice by mouth daily prn constipation    Dispense:  255 g    Refill:  0    For future refills   CLARITIN  10 MG CAPS    Sig: Take 1 capsule (10 mg total) by mouth daily.    Dispense:  90 capsule    Refill:  1    For future refills    Time spent:20 Minutes Time spent with patient included reviewing progress notes, labs, imaging studies, and discussing plan for follow up.  [place] Controlled Substance Database was reviewed by me for overdose risk score (ORS) if appropriate.  This patient was seen by Laurence Pons, FNP-C in collaboration with Dr. Verneta Gone as a part of collaborative care agreement.  Janisse Ghan R. Bobbi Burow, MSN, FNP-C Internal medicine

## 2024-05-27 ENCOUNTER — Encounter: Payer: Self-pay | Admitting: Nurse Practitioner

## 2024-07-18 ENCOUNTER — Telehealth (INDEPENDENT_AMBULATORY_CARE_PROVIDER_SITE_OTHER): Admitting: Nurse Practitioner

## 2024-07-18 ENCOUNTER — Encounter: Payer: Self-pay | Admitting: Nurse Practitioner

## 2024-07-18 VITALS — Resp 16 | Wt 135.0 lb

## 2024-07-18 DIAGNOSIS — Z8673 Personal history of transient ischemic attack (TIA), and cerebral infarction without residual deficits: Secondary | ICD-10-CM

## 2024-07-18 DIAGNOSIS — F17210 Nicotine dependence, cigarettes, uncomplicated: Secondary | ICD-10-CM

## 2024-07-18 DIAGNOSIS — J439 Emphysema, unspecified: Secondary | ICD-10-CM

## 2024-07-18 DIAGNOSIS — F01518 Vascular dementia, unspecified severity, with other behavioral disturbance: Secondary | ICD-10-CM | POA: Diagnosis not present

## 2024-07-18 MED ORDER — LORAZEPAM 0.5 MG PO TABS: ORAL_TABLET | ORAL | 0 refills | Status: DC

## 2024-07-18 NOTE — Progress Notes (Signed)
 [facility] [address] [place], [place] [address]  Internal MEDICINE  Telephone Visit  Patient Name: Ian Conley  [id number]  [id number]  Date of Service: 07/18/2024  I connected with the patient at 1140 by telephone and verified the patients identity using two identifiers.   I discussed the limitations, risks, security and privacy concerns of performing an evaluation and management service by telephone and the availability of in person appointments. I also discussed with the patient that there may be a patient responsible charge related to the service.  The patient expressed understanding and agrees to proceed.    Chief Complaint  Patient presents with   Telephone Screen    Med refills   Telephone Assessment    HPI Ian Conley presents for a telehealth virtual visit for dementia, anxiety, refills, and smoking.  Anxiety -- takes lorazepam  as needed  Dementia -- currently taking aricept  and namenda . Ian Conley is also on lorazepam  as needed for behavioral disturbance and agitation related to dementia  Needs to see dentist but refuses Smoking more cigarettes now than previous   Current Medication: Outpatient Encounter Medications as of 07/18/2024  Medication Sig   amLODipine  (NORVASC ) 5 MG tablet Take 1 tablet (5 mg total) by mouth daily.   atorvastatin  (LIPITOR) 80 MG tablet Take 1 tablet (80 mg total) by mouth at bedtime.   CLARITIN  10 MG CAPS Take 1 capsule (10 mg total) by mouth daily.   dipyridamole -aspirin  (AGGRENOX ) 200-25 MG 12hr capsule Take 1 capsule by mouth 2 (two) times daily.   donepezil  (ARICEPT ) 10 MG tablet Take 1 tablet (10 mg total) by mouth at bedtime.   LORazepam  (ATIVAN ) 0.5 MG tablet TAKE ONE TABLET BY MOUTH EVERY MORNING and TAKE 1/2 TABLET BY MOUTH EVERYDAY AT BEDTIME   memantine  (NAMENDA ) 10 MG tablet TAKE 1 TABLET(10 MG) BY MOUTH TWICE DAILY   mirtazapine  (REMERON ) 7.5 MG tablet TAKE ONE TABLET BY MOUTH EVERYDAY AT BEDTIME   multivitamin (ONE-A-DAY  MEN'S) TABS tablet Take 1 tablet by mouth daily.   polyethylene glycol powder (GLYCOLAX /MIRALAX ) 17 GM/SCOOP powder 1 scoop mixed in water or juice by mouth daily prn constipation   [DISCONTINUED] LORazepam  (ATIVAN ) 0.5 MG tablet TAKE ONE TABLET BY MOUTH EVERY MORNING and TAKE 1/2 TABLET BY MOUTH EVERYDAY AT BEDTIME   No facility-administered encounter medications on file as of 07/18/2024.    Surgical History: History reviewed. No pertinent surgical history.  Medical History: Past Medical History:  Diagnosis Date   Allergy    Anemia    Hypertension    Pancreatitis    Personal history of tobacco use, presenting hazards to health 03/25/2016   Polycythemia    Stroke [facility])    2008 9/11    Family History: Family History  Problem Relation Age of Onset   Diabetes Mother    Heart failure Mother    Cancer Father        Throat    Stroke Father    Hypertension Father    HIV Brother    Thyroid  disease Sister        thyroid  cancer   Hypertension Sister        both sisters    Social History   Socioeconomic History   Marital status: Widowed    Spouse name: Not on file   Number of children: Not on file   Years of education: Not on file   Highest education level: Not on file  Occupational History   Not on file  Tobacco Use  Smoking status: Every Day    Current packs/day: 1.00    Average packs/day: 1 pack/day for 45.0 years (45.0 ttl pk-yrs)    Types: Cigarettes   Smokeless tobacco: Never   Tobacco comments:    1 pack per week  Vaping Use   Vaping status: Never Used  Substance and Sexual Activity   Alcohol use: Not Currently    Comment: when pt was at home, no alcohol since July, 2019   Drug use: No   Sexual activity: Not Currently  Other Topics Concern   Not on file  Social History Narrative   Not on file   Social Drivers of Health   Financial Resource Strain: Low Risk  (11/15/2023)   Overall Financial Resource Strain (CARDIA)    Difficulty of Paying Living  Expenses: Not very hard  Food Insecurity: No Food Insecurity (11/15/2023)   Hunger Vital Sign    Worried About Running Out of Food in the Last Year: Never true    Ran Out of Food in the Last Year: Never true  Transportation Needs: Unmet Transportation Needs (11/15/2023)   PRAPARE - Administrator, Civil Service (Medical): Yes    Lack of Transportation (Non-Medical): No  Physical Activity: Inactive (11/15/2023)   Exercise Vital Sign    Days of Exercise per Week: 0 days    Minutes of Exercise per Session: 0 min  Stress: Stress Concern Present (11/15/2023)   [employer] of Occupational Health - Occupational Stress Questionnaire    Feeling of Stress : To some extent  Social Connections: Socially Isolated (11/15/2023)   Social Connection and Isolation Panel    Frequency of Communication with Friends and Family: Once a week    Frequency of Social Gatherings with Friends and Family: Once a week    Attends Religious Services: Never    Database administrator or Organizations: No    Attends Banker Meetings: Never    Marital Status: Widowed  Intimate Partner Violence: Not At Risk (11/15/2023)   Humiliation, Afraid, Rape, and Kick questionnaire    Fear of Current or Ex-Partner: No    Emotionally Abused: No    Physically Abused: No    Sexually Abused: No      Review of Systems  Constitutional:  Negative for chills, fatigue and unexpected weight change.  HENT:  Negative for congestion, rhinorrhea, sneezing and sore throat.   Eyes:  Negative for redness.  Respiratory: Negative.  Negative for cough, chest tightness, shortness of breath and wheezing.   Cardiovascular: Negative.  Negative for chest pain and palpitations.  Gastrointestinal:  Negative for abdominal pain, constipation, diarrhea, nausea and vomiting.  Genitourinary:  Negative for dysuria and frequency.  Musculoskeletal: Negative.  Negative for arthralgias, back pain, joint swelling and neck pain.   Skin:  Negative for rash.  Neurological: Negative.  Negative for tremors and numbness.  Hematological:  Negative for adenopathy. Does not bruise/bleed easily.  Psychiatric/Behavioral:  Positive for agitation, confusion and sleep disturbance. Negative for behavioral problems (Depression), self-injury and suicidal ideas. The patient is nervous/anxious.     Vital Signs: Resp 16   Wt 135 lb (61.2 kg)   BMI 19.94 kg/m    Observation/Objective: Ian Conley is alert but disoriented. Ian Conley is refusing to go to appointments, dentist or lab and is refusing to shower and complete normal activities of daily living. Virtual visit was conducted on the phone with his sister, Ian Conley who is his caregiver.     Assessment/Plan: 1. Chronic pulmonary  emphysema not affecting current episode of care [facility]) (Primary) Breathing is stable for now. Smoking more, this may worsen his breathing over time.   2. Dementia, multiinfarct, with behavioral disturbance (HCC) Continue namenda  and aricept  as prescribed. Continue lorazepam  as prescribed. Follow up for telehealth visit in 3 months for additional refills.  - LORazepam  (ATIVAN ) 0.5 MG tablet; TAKE ONE TABLET BY MOUTH EVERY MORNING and TAKE 1/2 TABLET BY MOUTH EVERYDAY AT BEDTIME  Dispense: 135 tablet; Refill: 0  3. Moderate smoker (20 or less per day) Increased how often Ian Conley is smoking per his sister's report but his breathing is ok, does have a smoker's cough.   4. History of stroke Noted, contributing to his dementia.    General Counseling: Ian Conley verbalizes understanding of the findings of today's phone visit and agrees with plan of treatment. I have discussed any further diagnostic evaluation that may be needed or ordered today. We also reviewed his medications today. Ian Conley has been encouraged to call the office with any questions or concerns that should arise related to todays visit.  Return in about 3 months (around 10/11/2024) for F/U, anxiety med refill, Isaul Landi  PCP, telemed.   No orders of the defined types were placed in this encounter.   Meds ordered this encounter  Medications   LORazepam  (ATIVAN ) 0.5 MG tablet    Sig: TAKE ONE TABLET BY MOUTH EVERY MORNING and TAKE 1/2 TABLET BY MOUTH EVERYDAY AT BEDTIME    Dispense:  135 tablet    Refill:  0    3 month refill    Time spent:20 Minutes Time spent with patient included reviewing progress notes, labs, imaging studies, and discussing plan for follow up.  [place] Controlled Substance Database was reviewed by me for overdose risk score (ORS) if appropriate.  This patient was seen by Mardy Maxin, FNP-C in collaboration with Dr. Sigrid Bathe as a part of collaborative care agreement.  Rockey Guarino R. Maxin, MSN, FNP-C Internal medicine

## 2024-07-19 ENCOUNTER — Encounter: Payer: Self-pay | Admitting: Nurse Practitioner

## 2024-07-28 ENCOUNTER — Other Ambulatory Visit: Payer: Self-pay | Admitting: Nurse Practitioner

## 2024-07-28 DIAGNOSIS — Z79899 Other long term (current) drug therapy: Secondary | ICD-10-CM

## 2024-07-28 DIAGNOSIS — F01518 Vascular dementia, unspecified severity, with other behavioral disturbance: Secondary | ICD-10-CM

## 2024-08-23 ENCOUNTER — Other Ambulatory Visit: Payer: Self-pay | Admitting: Nurse Practitioner

## 2024-08-23 DIAGNOSIS — Z79899 Other long term (current) drug therapy: Secondary | ICD-10-CM

## 2024-08-23 DIAGNOSIS — F01518 Vascular dementia, unspecified severity, with other behavioral disturbance: Secondary | ICD-10-CM

## 2024-09-11 ENCOUNTER — Telehealth: Payer: Self-pay

## 2024-09-11 DIAGNOSIS — E559 Vitamin D deficiency, unspecified: Secondary | ICD-10-CM

## 2024-09-11 DIAGNOSIS — E43 Unspecified severe protein-calorie malnutrition: Secondary | ICD-10-CM

## 2024-09-11 DIAGNOSIS — R7989 Other specified abnormal findings of blood chemistry: Secondary | ICD-10-CM

## 2024-09-11 DIAGNOSIS — Z8673 Personal history of transient ischemic attack (TIA), and cerebral infarction without residual deficits: Secondary | ICD-10-CM

## 2024-09-11 DIAGNOSIS — E782 Mixed hyperlipidemia: Secondary | ICD-10-CM

## 2024-09-11 DIAGNOSIS — E538 Deficiency of other specified B group vitamins: Secondary | ICD-10-CM

## 2024-09-11 DIAGNOSIS — R7303 Prediabetes: Secondary | ICD-10-CM

## 2024-09-11 DIAGNOSIS — Z125 Encounter for screening for malignant neoplasm of prostate: Secondary | ICD-10-CM

## 2024-09-13 NOTE — Telephone Encounter (Signed)
Patient sister was notified

## 2024-09-14 ENCOUNTER — Ambulatory Visit: Admitting: Podiatry

## 2024-09-14 DIAGNOSIS — B351 Tinea unguium: Secondary | ICD-10-CM | POA: Diagnosis not present

## 2024-09-14 DIAGNOSIS — E538 Deficiency of other specified B group vitamins: Secondary | ICD-10-CM | POA: Diagnosis not present

## 2024-09-14 DIAGNOSIS — M79674 Pain in right toe(s): Secondary | ICD-10-CM | POA: Diagnosis not present

## 2024-09-14 DIAGNOSIS — R7303 Prediabetes: Secondary | ICD-10-CM | POA: Diagnosis not present

## 2024-09-14 DIAGNOSIS — Z8673 Personal history of transient ischemic attack (TIA), and cerebral infarction without residual deficits: Secondary | ICD-10-CM | POA: Diagnosis not present

## 2024-09-14 DIAGNOSIS — M79675 Pain in left toe(s): Secondary | ICD-10-CM

## 2024-09-14 DIAGNOSIS — E43 Unspecified severe protein-calorie malnutrition: Secondary | ICD-10-CM | POA: Diagnosis not present

## 2024-09-14 NOTE — Progress Notes (Signed)
 This patient returns to my office for at risk foot care.  This patient requires this care by a professional since this patient will be at risk due to having thrombocytopenia. This patient presents to the office with his sister.  This patient is unable to cut nails himself since the patient cannot reach his nails.These nails are painful walking and wearing shoes.  Patient also has painful callus under his big toe joint right foot.  This patient presents for at risk foot care today.  General Appearance  Alert, conversant and in no acute stress.  Vascular  Dorsalis pedis and posterior tibial  pulses are palpable  bilaterally.  Capillary return is within normal limits  bilaterally. Temperature is within normal limits  bilaterally.  Neurologic  Senn-Weinstein monofilament wire test within normal limits  bilaterally. Muscle power within normal limits bilaterally.  Nails Thick disfigured discolored nails with subungual debris  from hallux to fifth toes bilaterally. No evidence of bacterial infection or drainage bilaterally.  Orthopedic  No limitations of motion  feet .  No crepitus or effusions noted.  No bony pathology or digital deformities noted.  Skin  normotropic skin  noted bilaterally.  No signs of infections or ulcers noted.   Callus sub 1st MPJ right foot.  Onychomycosis  Pain in right toes  Pain in left toes  Pre-ulcerous callus right foot.  Consent was obtained for treatment procedures.   Mechanical debridement of nails 1-5  bilaterally performed with a nail nipper.  Filed with dremel without incident. Debride callus with # 15 blade followed by dremel usage.   Return office visit     3 months                 Told patient to return for periodic foot care and evaluation due to potential at risk complications.   Franky Blanch D.P.M.

## 2024-09-15 LAB — CMP14+EGFR
ALT: 19 IU/L (ref 0–44)
AST: 19 IU/L (ref 0–40)
Albumin: 3.9 g/dL (ref 3.8–4.8)
Alkaline Phosphatase: 109 IU/L (ref 47–123)
BUN/Creatinine Ratio: 7 — ABNORMAL LOW (ref 10–24)
BUN: 8 mg/dL (ref 8–27)
Bilirubin Total: 0.3 mg/dL (ref 0.0–1.2)
CO2: 22 mmol/L (ref 20–29)
Calcium: 9.3 mg/dL (ref 8.6–10.2)
Chloride: 107 mmol/L — ABNORMAL HIGH (ref 96–106)
Creatinine, Ser: 1.12 mg/dL (ref 0.76–1.27)
Globulin, Total: 2.4 g/dL (ref 1.5–4.5)
Glucose: 168 mg/dL — ABNORMAL HIGH (ref 70–99)
Potassium: 3.9 mmol/L (ref 3.5–5.2)
Sodium: 142 mmol/L (ref 134–144)
Total Protein: 6.3 g/dL (ref 6.0–8.5)
eGFR: 70 mL/min/1.73 (ref >59)

## 2024-09-15 LAB — CBC WITH DIFFERENTIAL/PLATELET
Basophils Absolute: 0 x10E3/uL (ref 0.0–0.2)
Basos: 0 %
EOS (ABSOLUTE): 0.1 x10E3/uL (ref 0.0–0.4)
Eos: 2 %
Hematocrit: 40.2 % (ref 37.5–51.0)
Hemoglobin: 13.2 g/dL (ref 13.0–17.7)
Immature Grans (Abs): 0 x10E3/uL (ref 0.0–0.1)
Immature Granulocytes: 0 %
Lymphocytes Absolute: 1.5 x10E3/uL (ref 0.7–3.1)
Lymphs: 37 %
MCH: 29.3 pg (ref 26.6–33.0)
MCHC: 32.8 g/dL (ref 31.5–35.7)
MCV: 89 fL (ref 79–97)
Monocytes Absolute: 0.4 x10E3/uL (ref 0.1–0.9)
Monocytes: 11 %
Neutrophils Absolute: 2.1 x10E3/uL (ref 1.4–7.0)
Neutrophils: 50 %
Platelets: 152 x10E3/uL (ref 150–450)
RBC: 4.5 x10E6/uL (ref 4.14–5.80)
RDW: 12.9 % (ref 11.6–15.4)
WBC: 4.1 x10E3/uL (ref 3.4–10.8)

## 2024-09-15 LAB — TSH+FREE T4
Free T4: 1.17 ng/dL (ref 0.82–1.77)
TSH: 0.491 u[IU]/mL (ref 0.450–4.500)

## 2024-09-15 LAB — IRON,TIBC AND FERRITIN PANEL
Ferritin: 302 ng/mL (ref 30–400)
Iron Saturation: 43 % (ref 15–55)
Iron: 79 ug/dL (ref 38–169)
Total Iron Binding Capacity: 183 ug/dL — ABNORMAL LOW (ref 250–450)
UIBC: 104 ug/dL — ABNORMAL LOW (ref 111–343)

## 2024-09-15 LAB — LIPID PANEL
Chol/HDL Ratio: 2.1 ratio (ref 0.0–5.0)
Cholesterol, Total: 95 mg/dL — ABNORMAL LOW (ref 100–199)
HDL: 45 mg/dL (ref >39)
LDL Chol Calc (NIH): 36 mg/dL (ref 0–99)
Triglycerides: 65 mg/dL (ref 0–149)
VLDL Cholesterol Cal: 14 mg/dL (ref 5–40)

## 2024-09-15 LAB — PSA TOTAL (REFLEX TO FREE): Prostate Specific Ag, Serum: 0.5 ng/mL (ref 0.0–4.0)

## 2024-09-15 LAB — B12 AND FOLATE PANEL
Folate: >20 ng/mL (ref >3.0)
Vitamin B-12: 738 pg/mL (ref 232–1245)

## 2024-09-15 LAB — VITAMIN D 25 HYDROXY (VIT D DEFICIENCY, FRACTURES): Vit D, 25-Hydroxy: 64 ng/mL (ref 30.0–100.0)

## 2024-09-15 LAB — HGB A1C W/O EAG: Hgb A1c MFr Bld: 5.4 % (ref 4.8–5.6)

## 2024-09-26 NOTE — Progress Notes (Signed)
 Ian Conley                                          MRN: [id number]   09/26/2024   The [facility] Quality Team Specialist reviewed this patient medical record for the purposes of chart review for care gap closure. The following were reviewed: chart review for care gap closure-colorectal cancer screening.    [facility] Quality Team

## 2024-10-05 ENCOUNTER — Telehealth: Payer: Self-pay | Admitting: Nurse Practitioner

## 2024-10-05 NOTE — Telephone Encounter (Signed)
 Received vm from Orlean Gavel to schedule cpe. Attempted to return her call. No answer, mb full. This will need to be in-office visit-Toni

## 2024-10-30 ENCOUNTER — Other Ambulatory Visit: Payer: Self-pay | Admitting: Internal Medicine

## 2024-10-30 ENCOUNTER — Telehealth: Payer: Self-pay

## 2024-10-30 DIAGNOSIS — F01518 Vascular dementia, unspecified severity, with other behavioral disturbance: Secondary | ICD-10-CM

## 2024-10-30 MED ORDER — LORAZEPAM 0.5 MG PO TABS: ORAL_TABLET | ORAL | 0 refills | Status: DC

## 2024-10-31 NOTE — Telephone Encounter (Signed)
 Pt sister advised we sent med

## 2024-11-08 ENCOUNTER — Encounter: Payer: Self-pay | Admitting: Nurse Practitioner

## 2024-11-08 ENCOUNTER — Ambulatory Visit: Admitting: Nurse Practitioner

## 2024-11-08 VITALS — BP 118/82 | HR 72 | Temp 98.2°F | Resp 16 | Ht 71.0 in | Wt 141.2 lb

## 2024-11-08 DIAGNOSIS — Z0001 Encounter for general adult medical examination with abnormal findings: Secondary | ICD-10-CM

## 2024-11-08 DIAGNOSIS — Z23 Encounter for immunization: Secondary | ICD-10-CM

## 2024-11-08 DIAGNOSIS — I10 Essential (primary) hypertension: Secondary | ICD-10-CM

## 2024-11-08 DIAGNOSIS — F01518 Vascular dementia, unspecified severity, with other behavioral disturbance: Secondary | ICD-10-CM | POA: Diagnosis not present

## 2024-11-08 DIAGNOSIS — Z8673 Personal history of transient ischemic attack (TIA), and cerebral infarction without residual deficits: Secondary | ICD-10-CM | POA: Diagnosis not present

## 2024-11-08 DIAGNOSIS — F17210 Nicotine dependence, cigarettes, uncomplicated: Secondary | ICD-10-CM

## 2024-11-08 MED ORDER — LORAZEPAM 0.5 MG PO TABS: ORAL_TABLET | ORAL | 2 refills | Status: AC

## 2024-11-08 MED ORDER — PNEUMOCOCCAL 20-VAL CONJ VACC 0.5 ML IM SUSY: 0.5000 mL | PREFILLED_SYRINGE | Freq: Once | INTRAMUSCULAR | 0 refills | Status: AC | PRN

## 2024-11-08 NOTE — Progress Notes (Signed)
 [facility] [address] [place], [place] [address]  Internal MEDICINE  Office Visit Note  Patient Name: Ian Conley  [id number]  [id number]  Date of Service: 11/08/2024  Chief Complaint  Patient presents with   Hypertension   Medicare Wellness    HPI Ian Conley presents for a medicare annual wellness visit.  Well-appearing 73 y.o. male with hypertension, dementia, COPD, depression, and history of stroke.  Routine CRC screening: due in 2031 Eye exam: overdue  foot exam: done  Labs:  routine labs done in September, resutls discussed with patient and caregiver today New or worsening pain: none  Other concerns:     11/08/2024    3:27 PM 11/15/2023    4:51 PM 01/21/2022    2:13 PM  MMSE - Mini Mental State Exam  Not completed:  Unable to complete --  Orientation to time 0  2  Orientation to Place 5  5  Registration 0  3  Attention/ Calculation 5  0  Recall 0  3  Language- name 2 objects 2  2  Language- repeat 1  1  Language- follow 3 step command 3  3  Language- read & follow direction 1  1  Write a sentence 0  1  Copy design 1  1  Total score 18  22    Functional Status Survey: Is the patient deaf or have difficulty hearing?: No Does the patient have difficulty seeing, even when wearing glasses/contacts?: No Does the patient have difficulty concentrating, remembering, or making decisions?: Yes Does the patient have difficulty walking or climbing stairs?: No Does the patient have difficulty dressing or bathing?: No Does the patient have difficulty doing errands alone such as visiting a doctor's office or shopping?: Yes     08/28/2021   11:45 AM 01/21/2022    2:13 PM 06/24/2022   11:36 AM 11/15/2023    5:01 PM 11/08/2024    3:26 PM  Fall Risk  Falls in the past year? 0 0 0 0 0  Was there an injury with Fall?    0 0  Fall Risk Category Calculator    0 0  (RETIRED) Patient Fall Risk Level Low fall risk  Low fall risk      Patient at Risk for Falls Due  to No Fall Risks No Fall Risks  Mental status change;Impaired balance/gait;Impaired mobility;Medication side effect   Fall risk Follow up Falls evaluation completed  Falls evaluation completed    Falls evaluation completed     Data saved with a previous flowsheet row definition       11/08/2024    3:26 PM  Depression screen PHQ 2/9  Decreased Interest 0  Down, Depressed, Hopeless 0  PHQ - 2 Score 0      Current Medication: Outpatient Encounter Medications as of 11/08/2024  Medication Sig   pneumococcal 20-valent conjugate vaccine (PREVNAR 20) 0.5 ML injection Inject 0.5 mLs into the muscle once as needed for up to 1 dose for immunization.   amLODipine  (NORVASC ) 5 MG tablet Take 1 tablet (5 mg total) by mouth daily.   atorvastatin  (LIPITOR) 80 MG tablet Take 1 tablet (80 mg total) by mouth at bedtime.   CLARITIN  10 MG CAPS Take 1 capsule (10 mg total) by mouth daily.   dipyridamole -aspirin  (AGGRENOX ) 200-25 MG 12hr capsule TAKE 1 CAPSULE BY MOUTH TWICE DAILY   donepezil  (ARICEPT ) 10 MG tablet Take 1 tablet (10 mg total) by mouth at bedtime.   LORazepam  (ATIVAN ) 0.5 MG  tablet TAKE ONE TABLET BY MOUTH EVERY MORNING and TAKE 1/2 TABLET BY MOUTH EVERYDAY AT BEDTIME   memantine  (NAMENDA ) 10 MG tablet TAKE 1 TABLET(10 MG) BY MOUTH TWICE DAILY   mirtazapine  (REMERON ) 7.5 MG tablet TAKE 1 TABLET BY MOUTH EVERY DAY AT BEDTIME   multivitamin (ONE-A-DAY MEN'S) TABS tablet Take 1 tablet by mouth daily.   polyethylene glycol powder (GLYCOLAX /MIRALAX ) 17 GM/SCOOP powder 1 scoop mixed in water or juice by mouth daily prn constipation   [DISCONTINUED] LORazepam  (ATIVAN ) 0.5 MG tablet TAKE ONE TABLET BY MOUTH EVERY MORNING and TAKE 1/2 TABLET BY MOUTH EVERYDAY AT BEDTIME   No facility-administered encounter medications on file as of 11/08/2024.    Surgical History: History reviewed. No pertinent surgical history.  Medical History: Past Medical History:  Diagnosis Date   Allergy    Anemia     Hypertension    Pancreatitis    Personal history of tobacco use, presenting hazards to health 03/25/2016   Polycythemia    Stroke [facility])    2008 9/11    Family History: Family History  Problem Relation Age of Onset   Diabetes Mother    Heart failure Mother    Cancer Father        Throat    Stroke Father    Hypertension Father    HIV Brother    Thyroid  disease Sister        thyroid  cancer   Hypertension Sister        both sisters    Social History   Socioeconomic History   Marital status: Widowed    Spouse name: Not on file   Number of children: Not on file   Years of education: Not on file   Highest education level: Not on file  Occupational History   Not on file  Tobacco Use   Smoking status: Every Day    Current packs/day: 1.00    Average packs/day: 1 pack/day for 45.0 years (45.0 ttl pk-yrs)    Types: Cigarettes   Smokeless tobacco: Never   Tobacco comments:    1 pack per week  Vaping Use   Vaping status: Never Used  Substance and Sexual Activity   Alcohol use: Not Currently    Comment: when pt was at home, no alcohol since July, 2019   Drug use: No   Sexual activity: Not Currently  Other Topics Concern   Not on file  Social History Narrative   Not on file   Social Drivers of Health   Financial Resource Strain: Low Risk  (11/15/2023)   Overall Financial Resource Strain (CARDIA)    Difficulty of Paying Living Expenses: Not very hard  Food Insecurity: No Food Insecurity (11/15/2023)   Hunger Vital Sign    Worried About Running Out of Food in the Last Year: Never true    Ran Out of Food in the Last Year: Never true  Transportation Needs: Unmet Transportation Needs (11/15/2023)   PRAPARE - Administrator, Civil Service (Medical): Yes    Lack of Transportation (Non-Medical): No  Physical Activity: Inactive (11/15/2023)   Exercise Vital Sign    Days of Exercise per Week: 0 days    Minutes of Exercise per Session: 0 min  Stress: Stress  Concern Present (11/15/2023)   [employer] of Occupational Health - Occupational Stress Questionnaire    Feeling of Stress : To some extent  Social Connections: Socially Isolated (11/15/2023)   Social Connection and Isolation Panel    Frequency  of Communication with Friends and Family: Once a week    Frequency of Social Gatherings with Friends and Family: Once a week    Attends Religious Services: Never    Database Administrator or Organizations: No    Attends Banker Meetings: Never    Marital Status: Widowed  Intimate Partner Violence: Not At Risk (11/15/2023)   Humiliation, Afraid, Rape, and Kick questionnaire    Fear of Current or Ex-Partner: No    Emotionally Abused: No    Physically Abused: No    Sexually Abused: No      Review of Systems  Constitutional:  Negative for activity change, appetite change, chills, fatigue, fever and unexpected weight change.  HENT: Negative.  Negative for congestion, ear pain, rhinorrhea, sore throat and trouble swallowing.   Eyes: Negative.   Respiratory: Negative.  Negative for cough, chest tightness, shortness of breath and wheezing.   Cardiovascular: Negative.  Negative for chest pain and palpitations.  Gastrointestinal: Negative.  Negative for abdominal pain, blood in stool, constipation, diarrhea, nausea and vomiting.  Endocrine: Negative.   Genitourinary: Negative.  Negative for difficulty urinating, dysuria, frequency, hematuria and urgency.  Musculoskeletal: Negative.  Negative for arthralgias, back pain, joint swelling, myalgias and neck pain.  Skin: Negative.  Negative for rash and wound.  Allergic/Immunologic: Negative.  Negative for immunocompromised state.  Neurological: Negative.  Negative for dizziness, seizures, numbness and headaches.  Hematological: Negative.   Psychiatric/Behavioral:  Positive for agitation and confusion (intermittent, patient has dementia). Negative for behavioral problems, self-injury and  suicidal ideas. The patient is not nervous/anxious.     Vital Signs: BP 118/82   Pulse 72   Temp 98.2 F (36.8 C)   Resp 16   Ht 5' 11 (1.803 m)   Wt 141 lb 3.2 oz (64 kg)   SpO2 95%   BMI 19.69 kg/m    Physical Exam Vitals reviewed.  Constitutional:      General: He is not in acute distress.    Appearance: Normal appearance. He is well-developed and normal weight. He is not ill-appearing or diaphoretic.  HENT:     Head: Normocephalic and atraumatic.     Right Ear: Tympanic membrane, ear canal and external ear normal.     Left Ear: Tympanic membrane, ear canal and external ear normal.     Nose: Nose normal. No congestion or rhinorrhea.     Mouth/Throat:     Mouth: Mucous membranes are moist.     Pharynx: Oropharynx is clear. No oropharyngeal exudate or posterior oropharyngeal erythema.  Eyes:     General: No scleral icterus.       Right eye: No discharge.        Left eye: No discharge.     Extraocular Movements: Extraocular movements intact.     Conjunctiva/sclera: Conjunctivae normal.     Pupils: Pupils are equal, round, and reactive to light.  Neck:     Thyroid : No thyromegaly.     Vascular: No JVD.     Trachea: No tracheal deviation.  Cardiovascular:     Rate and Rhythm: Normal rate and regular rhythm.     Pulses: Normal pulses.     Heart sounds: Normal heart sounds. No murmur heard.    No friction rub. No gallop.  Pulmonary:     Effort: Pulmonary effort is normal. No respiratory distress.     Breath sounds: Normal breath sounds. No stridor. No wheezing or rales.  Chest:     Chest wall:  No tenderness.  Abdominal:     General: Bowel sounds are normal. There is no distension.     Palpations: Abdomen is soft. There is no mass.     Tenderness: There is no abdominal tenderness. There is no guarding or rebound.  Musculoskeletal:        General: No tenderness or deformity. Normal range of motion.     Cervical back: Normal range of motion and neck supple.   Lymphadenopathy:     Cervical: No cervical adenopathy.  Skin:    General: Skin is warm and dry.     Capillary Refill: Capillary refill takes less than 2 seconds.     Coloration: Skin is not pale.     Findings: No erythema or rash.  Neurological:     Mental Status: He is alert. Mental status is at baseline.     Cranial Nerves: No cranial nerve deficit.     Motor: No abnormal muscle tone.     Coordination: Coordination normal.     Deep Tendon Reflexes: Reflexes are normal and symmetric.  Psychiatric:        Mood and Affect: Mood normal.        Behavior: Behavior normal.        Thought Content: Thought content normal.        Judgment: Judgment normal.        Assessment/Plan: 1. Encounter for Medicare annual examination with abnormal findings (Primary) Age-appropriate preventive screenings and vaccinations discussed. Routine labs for health maintenance results discussed with the patient and his sister today. PHM updated.    2. Essential hypertension Stable, continue medications as prescribed.   3. History of stroke Noted. Discussed lab results. Continue aggrenox  as prescribed   4. Need for vaccination - pneumococcal 20-valent conjugate vaccine (PREVNAR 20) 0.5 ML injection; Inject 0.5 mLs into the muscle once as needed for up to 1 dose for immunization.  Dispense: 0.5 mL; Refill: 0  5. Smoking greater than 40 pack years CT chest lung cancer screening ordered  - CT CHEST LUNG CA SCREEN LOW DOSE W/O CM; Future  6. Dementia, multiinfarct, with behavioral disturbance (HCC) Continue prn lorazepam  as prescribed. Follow up in 3 months for additional refills.  - LORazepam  (ATIVAN ) 0.5 MG tablet; TAKE ONE TABLET BY MOUTH EVERY MORNING and TAKE 1/2 TABLET BY MOUTH EVERYDAY AT BEDTIME  Dispense: 45 tablet; Refill: 2    General Counseling: Godwin verbalizes understanding of the findings of todays visit and agrees with plan of treatment. I have discussed any further diagnostic  evaluation that may be needed or ordered today. We also reviewed his medications today. he has been encouraged to call the office with any questions or concerns that should arise related to todays visit.    Orders Placed This Encounter  Procedures   CT CHEST LUNG CA SCREEN LOW DOSE W/O CM    Meds ordered this encounter  Medications   pneumococcal 20-valent conjugate vaccine (PREVNAR 20) 0.5 ML injection    Sig: Inject 0.5 mLs into the muscle once as needed for up to 1 dose for immunization.    Dispense:  0.5 mL    Refill:  0    Due for prevnar 20.   LORazepam  (ATIVAN ) 0.5 MG tablet    Sig: TAKE ONE TABLET BY MOUTH EVERY MORNING and TAKE 1/2 TABLET BY MOUTH EVERYDAY AT BEDTIME    Dispense:  45 tablet    Refill:  2    Return in about 12 weeks (around 01/31/2025) for  F/U, anxiety med refill, Timiko Offutt PCP.   Total time spent:30 Minutes Time spent includes review of chart, medications, test results, and follow up plan with the patient.   [place] Controlled Substance Database was reviewed by me.  This patient was seen by Mardy Maxin, FNP-C in collaboration with Dr. Sigrid Bathe as a part of collaborative care agreement.  Naftoli Penny R. Maxin, MSN, FNP-C Internal medicine

## 2024-11-10 ENCOUNTER — Telehealth: Payer: Self-pay | Admitting: Nurse Practitioner

## 2024-11-10 NOTE — Telephone Encounter (Signed)
 Most recent controlling BP notes faxed to [facility]; [phone]

## 2024-11-21 ENCOUNTER — Ambulatory Visit: Admitting: Nurse Practitioner

## 2024-11-22 ENCOUNTER — Inpatient Hospital Stay: Admission: RE | Admit: 2024-11-22

## 2024-11-23 ENCOUNTER — Inpatient Hospital Stay: Admission: RE | Admit: 2024-11-23 | Discharge: 2024-11-23 | Attending: Nurse Practitioner

## 2024-11-23 DIAGNOSIS — F17210 Nicotine dependence, cigarettes, uncomplicated: Secondary | ICD-10-CM

## 2024-12-15 ENCOUNTER — Encounter: Payer: Self-pay | Admitting: Nurse Practitioner

## 2025-01-30 ENCOUNTER — Ambulatory Visit: Admitting: Nurse Practitioner

## 2025-11-12 ENCOUNTER — Ambulatory Visit: Admitting: Nurse Practitioner
